# Patient Record
Sex: Female | Born: 1995 | Race: White | Hispanic: No | Marital: Single | Smoking: Former smoker
Health system: Southern US, Community
[De-identification: ages and names within clinical notes are randomized; demographics above are authoritative.]

## PROBLEM LIST (undated history)

## (undated) DIAGNOSIS — G43909 Migraine, unspecified, not intractable, without status migrainosus: Secondary | ICD-10-CM

## (undated) DIAGNOSIS — F329 Major depressive disorder, single episode, unspecified: Secondary | ICD-10-CM

## (undated) DIAGNOSIS — G35D Multiple sclerosis, unspecified: Secondary | ICD-10-CM

## (undated) DIAGNOSIS — G709 Myoneural disorder, unspecified: Secondary | ICD-10-CM

## (undated) DIAGNOSIS — G35 Multiple sclerosis: Secondary | ICD-10-CM

## (undated) DIAGNOSIS — F32A Depression, unspecified: Secondary | ICD-10-CM

## (undated) DIAGNOSIS — F419 Anxiety disorder, unspecified: Secondary | ICD-10-CM

## (undated) HISTORY — PX: TYMPANOSTOMY TUBE PLACEMENT: SHX32

## (undated) HISTORY — DX: Depression, unspecified: F32.A

## (undated) HISTORY — DX: Anxiety disorder, unspecified: F41.9

## (undated) HISTORY — DX: Myoneural disorder, unspecified: G70.9

## (undated) HISTORY — DX: Major depressive disorder, single episode, unspecified: F32.9

---
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
## 2017-02-28 ENCOUNTER — Encounter: Payer: Self-pay | Admitting: Emergency Medicine

## 2017-02-28 ENCOUNTER — Emergency Department
Admission: EM | Admit: 2017-02-28 | Discharge: 2017-02-28 | Disposition: A | Discharge status: Home / Self Care | Payer: Self-pay | Attending: Emergency Medicine | Admitting: Emergency Medicine

## 2017-02-28 DIAGNOSIS — Z5321 Procedure and treatment not carried out due to patient leaving prior to being seen by health care provider: Secondary | ICD-10-CM | POA: Insufficient documentation

## 2017-02-28 DIAGNOSIS — R1032 Left lower quadrant pain: Secondary | ICD-10-CM | POA: Insufficient documentation

## 2017-02-28 DIAGNOSIS — R51 Headache: Secondary | ICD-10-CM | POA: Insufficient documentation

## 2017-02-28 LAB — COMPREHENSIVE METABOLIC PANEL
ALT: 16 U/L (ref 14–54)
ANION GAP: 10 (ref 5–15)
AST: 27 U/L (ref 15–41)
Albumin: 4.3 g/dL (ref 3.5–5.0)
Alkaline Phosphatase: 53 U/L (ref 38–126)
BUN: 13 mg/dL (ref 6–20)
CHLORIDE: 104 mmol/L (ref 101–111)
CO2: 21 mmol/L — ABNORMAL LOW (ref 22–32)
CREATININE: 0.66 mg/dL (ref 0.44–1.00)
Calcium: 9.2 mg/dL (ref 8.9–10.3)
Glucose, Bld: 112 mg/dL — ABNORMAL HIGH (ref 65–99)
POTASSIUM: 3.7 mmol/L (ref 3.5–5.1)
SODIUM: 135 mmol/L (ref 135–145)
Total Bilirubin: 0.6 mg/dL (ref 0.3–1.2)
Total Protein: 7.4 g/dL (ref 6.5–8.1)

## 2017-02-28 LAB — URINALYSIS, COMPLETE (UACMP) WITH MICROSCOPIC
BACTERIA UA: NONE SEEN
BILIRUBIN URINE: NEGATIVE
GLUCOSE, UA: NEGATIVE mg/dL
Hgb urine dipstick: NEGATIVE
KETONES UR: NEGATIVE mg/dL
Nitrite: NEGATIVE
PH: 5 (ref 5.0–8.0)
PROTEIN: 100 mg/dL — AB
Specific Gravity, Urine: 1.03 (ref 1.005–1.030)

## 2017-02-28 LAB — POCT PREGNANCY, URINE: Preg Test, Ur: NEGATIVE

## 2017-02-28 LAB — CBC
HCT: 40.3 % (ref 35.0–47.0)
HEMOGLOBIN: 14.2 g/dL (ref 12.0–16.0)
MCH: 30.9 pg (ref 26.0–34.0)
MCHC: 35.3 g/dL (ref 32.0–36.0)
MCV: 87.4 fL (ref 80.0–100.0)
PLATELETS: 208 10*3/uL (ref 150–440)
RBC: 4.61 MIL/uL (ref 3.80–5.20)
RDW: 12.1 % (ref 11.5–14.5)
WBC: 4.9 10*3/uL (ref 3.6–11.0)

## 2017-02-28 LAB — LIPASE, BLOOD: LIPASE: 19 U/L (ref 11–51)

## 2017-02-28 NOTE — ED Triage Notes (Signed)
Pt arrived via POV from home with reports of left side abdominal pain for the past 2 days, pt states the pain comes and goes. Denies any vomiting or diarrhea, but states she did have  3 BMs today, but states she felt constipated. States she did not have loose or watery stools.  Pt has hx of MS as well. Mother is with patient. Pt states "if everything is ok I just want to go home"

## 2017-02-28 NOTE — ED Notes (Signed)
Pt has had more migraines than usual and has been taking Imitrex for relief.

## 2017-03-01 ENCOUNTER — Emergency Department: Payer: Self-pay

## 2017-03-01 ENCOUNTER — Encounter: Payer: Self-pay | Admitting: Emergency Medicine

## 2017-03-01 ENCOUNTER — Emergency Department
Admission: EM | Admit: 2017-03-01 | Discharge: 2017-03-01 | Disposition: A | Discharge status: Home / Self Care | Payer: Self-pay | Attending: Emergency Medicine | Admitting: Emergency Medicine

## 2017-03-01 DIAGNOSIS — Z87891 Personal history of nicotine dependence: Secondary | ICD-10-CM | POA: Insufficient documentation

## 2017-03-01 DIAGNOSIS — Z79899 Other long term (current) drug therapy: Secondary | ICD-10-CM | POA: Insufficient documentation

## 2017-03-01 DIAGNOSIS — Z880 Allergy status to penicillin: Secondary | ICD-10-CM | POA: Insufficient documentation

## 2017-03-01 DIAGNOSIS — R110 Nausea: Secondary | ICD-10-CM | POA: Insufficient documentation

## 2017-03-01 DIAGNOSIS — G35 Multiple sclerosis: Secondary | ICD-10-CM | POA: Insufficient documentation

## 2017-03-01 DIAGNOSIS — N83209 Unspecified ovarian cyst, unspecified side: Secondary | ICD-10-CM | POA: Insufficient documentation

## 2017-03-01 LAB — COMPREHENSIVE METABOLIC PANEL
ALBUMIN: 3.9 g/dL (ref 3.5–5.0)
ALK PHOS: 51 U/L (ref 38–126)
ALT: 17 U/L (ref 14–54)
ANION GAP: 9 (ref 5–15)
AST: 25 U/L (ref 15–41)
BILIRUBIN TOTAL: 1 mg/dL (ref 0.3–1.2)
BUN: 11 mg/dL (ref 6–20)
CO2: 21 mmol/L — AB (ref 22–32)
Calcium: 8.9 mg/dL (ref 8.9–10.3)
Chloride: 103 mmol/L (ref 101–111)
Creatinine, Ser: 0.62 mg/dL (ref 0.44–1.00)
GFR calc Af Amer: >60 mL/min (ref >60)
GFR calc non Af Amer: >60 mL/min (ref >60)
GLUCOSE: 105 mg/dL — AB (ref 65–99)
Potassium: 3.2 mmol/L — ABNORMAL LOW (ref 3.5–5.1)
Sodium: 133 mmol/L — ABNORMAL LOW (ref 135–145)
Total Protein: 6.9 g/dL (ref 6.5–8.1)

## 2017-03-01 LAB — CBC
HEMATOCRIT: 37.4 % (ref 35.0–47.0)
HEMOGLOBIN: 13 g/dL (ref 12.0–16.0)
MCH: 30.3 pg (ref 26.0–34.0)
MCHC: 34.8 g/dL (ref 32.0–36.0)
MCV: 86.9 fL (ref 80.0–100.0)
Platelets: 193 10*3/uL (ref 150–440)
RBC: 4.3 MIL/uL (ref 3.80–5.20)
RDW: 12.2 % (ref 11.5–14.5)
WBC: 3.7 10*3/uL (ref 3.6–11.0)

## 2017-03-01 LAB — LIPASE, BLOOD: LIPASE: 18 U/L (ref 11–51)

## 2017-03-01 MED ORDER — SODIUM CHLORIDE 0.9 % IV SOLN
1000.0000 mL | Freq: Once | INTRAVENOUS | Status: AC
  Administered 2017-03-01: 1000 mL via INTRAVENOUS

## 2017-03-01 MED ORDER — ONDANSETRON 4 MG PO TBDP: 4.0000 mg | ORAL_TABLET | Freq: Once | ORAL | Status: DC | PRN

## 2017-03-01 MED ORDER — TRAMADOL HCL 50 MG PO TABS: 50.0000 mg | ORAL_TABLET | Freq: Four times a day (QID) | ORAL | 0 refills | Status: DC | PRN

## 2017-03-01 MED ORDER — IOPAMIDOL (ISOVUE-300) INJECTION 61%
100.0000 mL | Freq: Once | INTRAVENOUS | Status: AC | PRN
  Administered 2017-03-01: 100 mL via INTRAVENOUS

## 2017-03-01 MED ORDER — MORPHINE SULFATE (PF) 4 MG/ML IV SOLN
4.0000 mg | Freq: Once | INTRAVENOUS | Status: AC
  Administered 2017-03-01: 4 mg via INTRAVENOUS
  Filled 2017-03-01: qty 1

## 2017-03-01 MED ORDER — ONDANSETRON 4 MG PO TBDP: 4.0000 mg | ORAL_TABLET | Freq: Three times a day (TID) | ORAL | 0 refills | Status: DC | PRN

## 2017-03-01 MED ORDER — ONDANSETRON HCL 4 MG/2ML IJ SOLN
4.0000 mg | Freq: Once | INTRAMUSCULAR | Status: AC
  Administered 2017-03-01: 4 mg via INTRAVENOUS
  Filled 2017-03-01: qty 2

## 2017-03-01 MED ORDER — IOPAMIDOL (ISOVUE-300) INJECTION 61%
15.0000 mL | INTRAVENOUS | Status: AC
  Administered 2017-03-01 (×2): 15 mL via ORAL

## 2017-03-01 NOTE — ED Triage Notes (Signed)
Patient presents to the ED with left lower quadrant pain x 2 days.  Patient came to the ED yesterday but left prior to being seen.  Patient reports she has had constant nausea and dry heaving since midnight.  Patient denies vomiting.  Reports frequent stools but states they are not loose.  Patient has history of MS.  Patient ambulatory to triage.

## 2017-03-01 NOTE — ED Provider Notes (Signed)
[facility] Emergency Department Provider Note   ____________________________________________    I have reviewed the triage vital signs and the nursing notes.   HISTORY  Chief Complaint Abdominal Pain     HPI Jennifer Watts is a 21 y.o. female Who presents with complaints of 2 days of left lower quadrant abdominal pain. Patient reports the pain has been worsening, she describes it as a constant cramping sensation worse with movement. She has never had this before. She came last night but left as she thought she was feeling better. Today she woke up feeling nauseated. She has not taken anything for this. No history of abdominal surgery. No fevers or chills, no diarrhea.asked father to leave the room and discussed with patient regarding vaginal discharge or concerns for STDs, she denies vaginal issues   Past Medical History:  Diagnosis Date  . Anxiety   . Depression   . MS (multiple sclerosis) (HCC)   . Neuromuscular disorder [facility])     Patient Active Problem List   Diagnosis Date Noted  . Panic disorder 07/08/2015  . Suicidal ideation 07/08/2015  . Sedative overdose 07/08/2015  . 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 Tobacco use disorder 07/08/2015  . Cannabis use disorder, moderate, dependence (HCC) 07/08/2015  . Dysmenorrhea in adolescent 06/02/2015  . Severe anxiety 10/28/2014  . Multiple sclerosis (HCC) 10/26/2014    Past Surgical History:  Procedure Laterality Date  . TYMPANOSTOMY TUBE PLACEMENT      Prior to Admission medications   Medication Sig Start Date End Date Taking? Authorizing Provider  Cholecalciferol (VITAMIN D PO) Take 1 tablet by mouth daily.   Yes [provider]  citalopram (CELEXA) 40 MG tablet Take 1 tablet (40 mg total) by mouth at bedtime. 07/10/15 03/01/17 Yes Pucilowska, Jolanta B, MD  Cyanocobalamin (B-12 PO) Take 1 tablet by mouth daily.   Yes [provider]  SUMAtriptan (IMITREX) 100 MG tablet Take 50-100 mg by mouth daily as needed. 01/08/17  Yes [provider]  traZODone (DESYREL) 100 MG tablet Take 1 tablet (100 mg total) by mouth at bedtime. Patient taking differently: Take 50 mg by mouth at bedtime.  07/10/15  Yes Pucilowska, Jolanta B, MD  ALPRAZolam (XANAX) 0.25 MG tablet Take 0.25 mg by mouth at bedtime as needed for anxiety.    [provider]  medroxyPROGESTERone (DEPO-PROVERA) 150 MG/ML injection INJECT 1 ML (150 MG TOTAL) INTO THE MUSCLE EVERY 3 (THREE) MONTHS. 11/28/16   Gabriel Cirri, NP  ondansetron (ZOFRAN ODT) 4 MG disintegrating tablet Take 1 tablet (4 mg total) by mouth every 8 (eight) hours as needed for nausea or vomiting. 03/01/17   Jene Every, MD  ondansetron (ZOFRAN) 8 MG tablet Take 1 tablet (8 mg total) by mouth every 8 (eight) hours as needed for nausea or vomiting. Patient not taking: Reported on 03/01/2017 09/02/16   Sudie Grumbling, NP  traMADol (ULTRAM) 50 MG tablet Take 1 tablet (50 mg total) by mouth every 6 (six) hours as needed. 03/01/17 03/01/18  Jene Every, MD     Allergies Ibuprofen; Penicillins; and Sulfa antibiotics  Family History  Problem Relation Age of Onset  . Hypertension Maternal Grandmother   . Cancer Paternal Grandfather        throat  . Multiple sclerosis Paternal Grandfather   . Anxiety disorder Mother     Social History Social History  Substance Use Topics  . Smoking status: Former Smoker  Packs/day: 0.50  . Smokeless tobacco: Never Used  . Alcohol use No    Review of Systems  Constitutional: No fever/chills Eyes: No visual changes.  ENT: No sore throat. Cardiovascular: Denies chest pain. Respiratory: Denies shortness of breath. Gastrointestinal: as above Genitourinary: Negative for dysuria. Musculoskeletal: Negative for back pain. Skin: Negative for rash. Neurological: Negative for headaches     ____________________________________________   PHYSICAL EXAM:  VITAL SIGNS: ED Triage Vitals  Enc Vitals Group     BP 03/01/17 0731 134/81     Pulse Rate 03/01/17 0731 87     Resp 03/01/17 0731 18     Temp 03/01/17 0731 98.2 F (36.8 C)     Temp Source 03/01/17 0731 Oral     SpO2 03/01/17 0731 96 %     Weight 03/01/17 0729 49.9 kg (110 lb)     Height 03/01/17 0729 1.651 m (5\' 5" )     Head Circumference --      Peak Flow --      Pain Score --      Pain Loc --      Pain Edu? --      Excl. in GC? --     Constitutional: Alert and oriented. No acute distress. Pleasant and interactive Eyes: Conjunctivae are normal.   Nose: No congestion/rhinnorhea. Mouth/Throat: Mucous membranes are moist.    Cardiovascular: Normal rate, regular rhythm. Grossly normal heart sounds.  Good peripheral circulation. Respiratory: Normal respiratory effort.  No retractions. Lungs CTAB. Gastrointestinal: no distention. No CVA tenderness, no left lower quadrant tenderness to palpation however right lower quadrant tenderness to palpation with McBurney's point tenderness Genitourinary: deferred Musculoskeletal:   Warm and well perfused Neurologic:  Normal speech and language. No gross focal neurologic deficits are appreciated.  Skin:  Skin is warm, dry and intact. No rash noted. Psychiatric: Mood and affect are normal. Speech and behavior are normal.  ____________________________________________   LABS (all labs ordered are listed, but only abnormal results are displayed)  Labs Reviewed  COMPREHENSIVE METABOLIC PANEL - Abnormal; Notable for the following:       Result Value   Sodium 133 (*)    Potassium 3.2 (*)    CO2 21 (*)    Glucose, Bld 105 (*)    All other components within normal limits  CBC  LIPASE, BLOOD   ____________________________________________  EKG  None ____________________________________________  RADIOLOGY  CT abdomen and pelvis performed with mild to moderate  fluid in the pelvis, normal appendix ____________________________________________   PROCEDURES  Procedure(s) performed: No    Critical Care performed:No ____________________________________________   INITIAL IMPRESSION / ASSESSMENT AND PLAN / ED COURSE  Pertinent labs & imaging results that were available during my care of the patient were reviewed by me and considered in my medical decision making (see chart for details).  patient with abdominal pain as described above. Differential diagnosis includes appendicitis, colitis, gastritis. pregnancy test is negative, done last night  Given the location of her pain I will order CT abdomen and pelvis to rule out appendicitis. We will give her IV morphine and IV Zofran and IV fluids  CT scan consistent with likely ruptured ovarian cyst. Patient has deferred pelvic exam, she feels much better after medication. Discussed with her incidental findings as well and the need for outpatient follow-up with her PCP    ____________________________________________   FINAL CLINICAL IMPRESSION(S) / ED DIAGNOSES  Final diagnoses:  Ruptured ovarian cyst      NEW MEDICATIONS STARTED DURING THIS  VISIT:  Discharge Medication List as of 03/01/2017 11:18 AM    START taking these medications   Details  traMADol (ULTRAM) 50 MG tablet Take 1 tablet (50 mg total) by mouth every 6 (six) hours as needed., Starting Thu 03/01/2017, Until Fri 03/01/2018, Print         Note:  This document was prepared using Dragon voice recognition software and may include unintentional dictation errors.    Jene Every, MD 03/01/17 [phone]

## 2017-03-05 ENCOUNTER — Ambulatory Visit: Payer: Self-pay | Admitting: Family Medicine

## 2017-03-05 ENCOUNTER — Ambulatory Visit (INDEPENDENT_AMBULATORY_CARE_PROVIDER_SITE_OTHER)
Admit: 2017-03-05 | Discharge: 2017-03-05 | Disposition: A | Discharge status: Home / Self Care | Payer: Self-pay | Attending: Family Medicine | Admitting: Family Medicine

## 2017-03-05 ENCOUNTER — Ambulatory Visit
Admission: EM | Admit: 2017-03-05 | Discharge: 2017-03-05 | Disposition: A | Discharge status: Home / Self Care | Payer: Self-pay | Attending: Family Medicine | Admitting: Family Medicine

## 2017-03-05 ENCOUNTER — Encounter: Payer: Self-pay | Admitting: Emergency Medicine

## 2017-03-05 DIAGNOSIS — N760 Acute vaginitis: Secondary | ICD-10-CM

## 2017-03-05 DIAGNOSIS — R1032 Left lower quadrant pain: Secondary | ICD-10-CM

## 2017-03-05 DIAGNOSIS — L509 Urticaria, unspecified: Secondary | ICD-10-CM

## 2017-03-05 DIAGNOSIS — R110 Nausea: Secondary | ICD-10-CM

## 2017-03-05 DIAGNOSIS — Z3202 Encounter for pregnancy test, result negative: Secondary | ICD-10-CM

## 2017-03-05 DIAGNOSIS — E876 Hypokalemia: Secondary | ICD-10-CM

## 2017-03-05 DIAGNOSIS — B9689 Other specified bacterial agents as the cause of diseases classified elsewhere: Secondary | ICD-10-CM

## 2017-03-05 DIAGNOSIS — R509 Fever, unspecified: Secondary | ICD-10-CM

## 2017-03-05 DIAGNOSIS — N730 Acute parametritis and pelvic cellulitis: Secondary | ICD-10-CM

## 2017-03-05 LAB — COMPREHENSIVE METABOLIC PANEL
ALBUMIN: 4.2 g/dL (ref 3.5–5.0)
ALK PHOS: 67 U/L (ref 38–126)
ALT: 23 U/L (ref 14–54)
ANION GAP: 10 (ref 5–15)
AST: 27 U/L (ref 15–41)
BUN: 11 mg/dL (ref 6–20)
CALCIUM: 9.2 mg/dL (ref 8.9–10.3)
CHLORIDE: 102 mmol/L (ref 101–111)
CO2: 25 mmol/L (ref 22–32)
Creatinine, Ser: 0.73 mg/dL (ref 0.44–1.00)
GFR calc non Af Amer: >60 mL/min (ref >60)
GLUCOSE: 114 mg/dL — AB (ref 65–99)
Potassium: 3 mmol/L — ABNORMAL LOW (ref 3.5–5.1)
Sodium: 137 mmol/L (ref 135–145)
Total Bilirubin: 1.5 mg/dL — ABNORMAL HIGH (ref 0.3–1.2)
Total Protein: 7.1 g/dL (ref 6.5–8.1)

## 2017-03-05 LAB — PREGNANCY, URINE: PREG TEST UR: NEGATIVE

## 2017-03-05 LAB — CBC WITH DIFFERENTIAL/PLATELET
Basophils Absolute: 0 10*3/uL (ref 0–0.1)
Basophils Relative: 0 %
Eosinophils Absolute: 0.1 10*3/uL (ref 0–0.7)
Eosinophils Relative: 1 %
HCT: 40.8 % (ref 35.0–47.0)
HEMOGLOBIN: 14.2 g/dL (ref 12.0–16.0)
LYMPHS ABS: 1.5 10*3/uL (ref 1.0–3.6)
LYMPHS PCT: 23 %
MCH: 30 pg (ref 26.0–34.0)
MCHC: 34.9 g/dL (ref 32.0–36.0)
MCV: 85.8 fL (ref 80.0–100.0)
MONOS PCT: 9 %
Monocytes Absolute: 0.6 10*3/uL (ref 0.2–0.9)
NEUTROS ABS: 4.5 10*3/uL (ref 1.4–6.5)
NEUTROS PCT: 67 %
Platelets: 234 10*3/uL (ref 150–440)
RBC: 4.76 MIL/uL (ref 3.80–5.20)
RDW: 12.3 % (ref 11.5–14.5)
WBC: 6.7 10*3/uL (ref 3.6–11.0)

## 2017-03-05 LAB — URINALYSIS, COMPLETE (UACMP) WITH MICROSCOPIC
Glucose, UA: 100 mg/dL — AB
HGB URINE DIPSTICK: NEGATIVE
KETONES UR: NEGATIVE mg/dL
LEUKOCYTES UA: NEGATIVE
NITRITE: NEGATIVE
PROTEIN: 100 mg/dL — AB
Specific Gravity, Urine: >1.03 — ABNORMAL HIGH (ref 1.005–1.030)
pH: 6 (ref 5.0–8.0)

## 2017-03-05 LAB — LIPASE, BLOOD: LIPASE: 19 U/L (ref 11–51)

## 2017-03-05 LAB — WET PREP, GENITAL
Sperm: NONE SEEN
Trich, Wet Prep: NONE SEEN
Yeast Wet Prep HPF POC: NONE SEEN

## 2017-03-05 LAB — CHLAMYDIA/NGC RT PCR (ARMC ONLY)
CHLAMYDIA TR: NOT DETECTED
N GONORRHOEAE: NOT DETECTED

## 2017-03-05 LAB — RAPID STREP SCREEN (MED CTR MEBANE ONLY): STREPTOCOCCUS, GROUP A SCREEN (DIRECT): NEGATIVE

## 2017-03-05 MED ORDER — POTASSIUM CHLORIDE CRYS ER 20 MEQ PO TBCR
40.0000 meq | EXTENDED_RELEASE_TABLET | Freq: Once | ORAL | Status: AC
  Administered 2017-03-05: 40 meq via ORAL

## 2017-03-05 MED ORDER — ONDANSETRON 8 MG PO TBDP
8.0000 mg | ORAL_TABLET | Freq: Once | ORAL | Status: AC
  Administered 2017-03-05: 8 mg via ORAL

## 2017-03-05 MED ORDER — ONDANSETRON HCL 4 MG PO TABS: 4.0000 mg | ORAL_TABLET | Freq: Four times a day (QID) | ORAL | 0 refills | Status: DC

## 2017-03-05 MED ORDER — METRONIDAZOLE 500 MG PO TABS: 500.0000 mg | ORAL_TABLET | Freq: Two times a day (BID) | ORAL | 0 refills | Status: AC

## 2017-03-05 MED ORDER — DOXYCYCLINE HYCLATE 100 MG PO CAPS: 100.0000 mg | ORAL_CAPSULE | Freq: Two times a day (BID) | ORAL | 0 refills | Status: AC

## 2017-03-05 MED ORDER — PREDNISONE 10 MG PO TABS: ORAL_TABLET | ORAL | 0 refills | Status: DC

## 2017-03-05 NOTE — Discharge Instructions (Signed)
Take medication as prescribed. Rest. Drink plenty of fluids.   Follow up with your primary care physician this week as discussed. Follow up with Gastroenterology.   Return to Urgent care for new or worsening concerns.

## 2017-03-05 NOTE — ED Provider Notes (Signed)
[facility] ____________________________________________  Time seen: Approximately 12:27 PM  I have reviewed the triage vital signs and the nursing notes.   HISTORY  Chief Complaint Urticaria; Nausea; and Fever   HPI Jennifer Watts is a 21 y.o. female  presenting with mother at bedside for evaluation of multiple medical complaints including nausea, abdominal pain, vaginal discharge and itchy rash. Reports initially had symptoms of left lower abdominal pain with nausea that is been present for approximately 4-5 days total, was seen in emergency room 4 days ago and had a CT abdomen, revealing suspected ruptured left ovarian cyst. States had received nausea and pain medication while in   emergency room and had felt better and was discharged. Reports is sent home with ODT Zofran, but insurance did not cover it and began taking meclizine over-the-counter to help with nausea which did not help. Reports 2 days after having CT she began having a very red itchy rash all over. States rash started on both legs and quickly worked upwards. States rash does somewhat improve with Benadryl, then returns. Denies any other changes in foods, medicines, lotions, detergents or other contacts. Denies insect bites. States only changes were taking meclizine, unsure if has taken before, and CT with contrast. States rash currently has improved than a few days ago, states did last take Benadryl at about 9 AM this morning.  Also reports the last 2-3 days has been having intermittent fevers at home, with highest being 102 yesterday. No Tylenol or antipyretics taken today prior to arrival, afebrile. States had one day of sore throat, denies any other complaints or throat. Denies any runny nose, nasal congestion or cough. States today noticed having some vaginal irritation and yellowish vaginal discharge. States sexually active with one partner, no recent changes, declines concerns of STDs. States some burning  with urination, new urinary frequency or urgency. Reports has continued with left lower quadrant abdominal pain currently mild, states it comes in waves. Denies any other abdominal pain, back pain, flank pain. States has had persistent nausea, no vomiting. Reports prior to ED visit she was having diarrhea, states yesterday he did have one loose stool but no other diarrheal episodes. States did take some over-the-counter Pepto and had some darker stool, but denies any blood in stool or otherwise abnormal colored stool. Denies any concern of blood in stool. States has continued drink fluids well, not eating quite as much is normal.  Denies chest pain, shortness of breath. States when rash worse she has some generalized extremity achiness and tightness.  Denies recent sickness. Denies recent antibiotic use.  Mother patient reports that she has a history of MS, and takes rituximab.  Gabriel Cirri, NP: PCP No LMP recorded. Patient has had an injection. Depo. Denies pregnancy.    Past Medical History:  Diagnosis Date  . Anxiety   . Depression   . MS (multiple sclerosis) (HCC)   . Neuromuscular disorder [facility])     Patient Active Problem List   Diagnosis Date Noted  . Panic disorder 07/08/2015  . Suicidal ideation 07/08/2015  . Sedative overdose 07/08/2015  . 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 Tobacco use disorder 07/08/2015  . Cannabis use disorder, moderate, dependence (HCC) 07/08/2015  . Dysmenorrhea in adolescent 06/02/2015  . Severe anxiety 10/28/2014  . Multiple sclerosis (HCC) 10/26/2014    Past Surgical History:  Procedure Laterality Date  . TYMPANOSTOMY TUBE PLACEMENT       No current facility-administered medications for this  encounter.   Current Outpatient Prescriptions:  .  ALPRAZolam (XANAX) 0.25 MG tablet, Take 0.25 mg by mouth at bedtime as needed for anxiety., Disp: , Rfl:  .  medroxyPROGESTERone (DEPO-PROVERA) 150 MG/ML  injection, INJECT 1 ML (150 MG TOTAL) INTO THE MUSCLE EVERY 3 (THREE) MONTHS., Disp: 1 mL, Rfl: 4 .  SUMAtriptan (IMITREX) 100 MG tablet, Take 50-100 mg by mouth daily as needed., Disp: , Rfl:  .  traMADol (ULTRAM) 50 MG tablet, Take 1 tablet (50 mg total) by mouth every 6 (six) hours as needed., Disp: 10 tablet, Rfl: 0 .  traZODone (DESYREL) 100 MG tablet, Take 1 tablet (100 mg total) by mouth at bedtime. (Patient taking differently: Take 50 mg by mouth at bedtime. ), Disp: 30 tablet, Rfl: 0 .  Cholecalciferol (VITAMIN D PO), Take 1 tablet by mouth daily., Disp: , Rfl:  .  citalopram (CELEXA) 40 MG tablet, Take 1 tablet (40 mg total) by mouth at bedtime., Disp: 30 tablet, Rfl: 0 .  Cyanocobalamin (B-12 PO), Take 1 tablet by mouth daily., Disp: , Rfl:  .  doxycycline (VIBRAMYCIN) 100 MG capsule, Take 1 capsule (100 mg total) by mouth 2 (two) times daily., Disp: 28 capsule, Rfl: 0 .  metroNIDAZOLE (FLAGYL) 500 MG tablet, Take 1 tablet (500 mg total) by mouth 2 (two) times daily., Disp: 28 tablet, Rfl: 0 .  ondansetron (ZOFRAN ODT) 4 MG disintegrating tablet, Take 1 tablet (4 mg total) by mouth every 8 (eight) hours as needed for nausea or vomiting., Disp: 20 tablet, Rfl: 0 .  ondansetron (ZOFRAN) 4 MG tablet, Take 1 tablet (4 mg total) by mouth every 6 (six) hours., Disp: 12 tablet, Rfl: 0 .  ondansetron (ZOFRAN) 8 MG tablet, Take 1 tablet (8 mg total) by mouth every 8 (eight) hours as needed for nausea or vomiting. (Patient not taking: Reported on 03/01/2017), Disp: 12 tablet, Rfl: 0 .  predniSONE (DELTASONE) 10 MG tablet, Start 60 mg po day one, then 50 mg po day two, taper by 10 mg daily until complete., Disp: 21 tablet, Rfl: 0 Retuximab  Allergies Ibuprofen; Penicillins; and Sulfa antibiotics  Family History  Problem Relation Age of Onset  . Hypertension Maternal Grandmother   . Cancer Paternal Grandfather        throat  . Multiple sclerosis Paternal Grandfather   . Anxiety disorder Mother    No history of PE/DVT  Social History Social History  Substance Use Topics  . Smoking status: Current Every Day Smoker    Packs/day: 0.50  . Smokeless tobacco: Never Used  . Alcohol use No    Review of Systems Constitutional: As above. ENT: One day of sore throat.  Cardiovascular: Denies chest pain. Respiratory: Denies shortness of breath. Gastrointestinal: As above.  Genitourinary: Positive for dysuria. Musculoskeletal: Negative for back pain. Skin: Positive for rash. Neurological: Negative for headaches, focal weakness or numbness.   ____________________________________________   PHYSICAL EXAM:  VITAL SIGNS: ED Triage Vitals  Enc Vitals Group     BP 03/05/17 1104 (!) 119/92     Pulse Rate 03/05/17 1104 97     Resp 03/05/17 1104 16     Temp 03/05/17 1104 98.6 F (37 C)     Temp Source 03/05/17 1104 Oral     SpO2 03/05/17 1104 100 %     Weight 03/05/17 1103 110 lb (49.9 kg)     Height 03/05/17 1103 5\' 5"  (1.651 m)     Head Circumference --  Peak Flow --      Pain Score 03/05/17 1104 2     Pain Loc --      Pain Edu? --      Excl. in GC? --     Constitutional: Alert and oriented. Well appearing and in no acute distress. Eyes: Conjunctivae are normal.  ENT      Head: Normocephalic and atraumatic.      Ears: nontender, no erythema, normal TMs bilaterally.       Nose: No congestion/rhinnorhea.      Mouth/Throat: Mucous membranes are moist.Oropharynx non-erythematous. No tonsillar swelling or exudate.  Neck: No stridor. Supple without meningismus.  Hematological/Lymphatic/Immunilogical: No cervical lymphadenopathy. Cardiovascular: Normal rate, regular rhythm. Grossly normal heart sounds.  Good peripheral circulation. Respiratory: Normal respiratory effort without tachypnea nor retractions. Breath sounds are clear and equal bilaterally. No wheezes, rales, rhonchi. Gastrointestinal: Mild left lower quadrant and periumbilical tenderness palpation. Abdomen  otherwise soft and nontender. Normal Bowel sounds. No CVA tenderness. Pelvic: Completed with Gunnar Fusi CMA at bedside as chaperone. External: Normal appearance, new rash or lesions. Speculum: Mild to moderate amount of whitish yellowish vaginal discharge, no bleeding, cervical os closed. Bimanual: No right adnexal tenderness, minimal cervical motion tenderness, mild left adnexal tenderness. Musculoskeletal:  Nontender with normal range of motion in all extremities. No midline cervical, thoracic or lumbar tenderness to palpation. Bilateral pedal pulses equal and easily palpated.      Right lower leg:  No tenderness or edema.      Left lower leg:  No tenderness or edema.  Neurologic:  Normal speech and language. No gross focal neurologic deficits are appreciated. Speech is normal. No gait instability.  Skin:  Skin is warm, dry. Diffuse blanchable minimally erythematous urticarial rash present to bilateral upper and lower extremities as well as torso, nontender, no edema, no drainage. Psychiatric: Mood and affect are normal. Speech and behavior are normal. Patient exhibits appropriate insight and judgment   ___________________________________________   LABS (all labs ordered are listed, but only abnormal results are displayed)  Labs Reviewed  WET PREP, GENITAL - Abnormal; Notable for the following:       Result Value   Clue Cells Wet Prep HPF POC PRESENT (*)    WBC, Wet Prep HPF POC TOO NUMEROUS TO COUNT (*)    All other components within normal limits  COMPREHENSIVE METABOLIC PANEL - Abnormal; Notable for the following:    Potassium 3.0 (*)    Glucose, Bld 114 (*)    Total Bilirubin 1.5 (*)    All other components within normal limits  URINALYSIS, COMPLETE (UACMP) WITH MICROSCOPIC - Abnormal; Notable for the following:    Color, Urine AMBER (*)    Specific Gravity, Urine >1.030 (*)    Glucose, UA 100 (*)    Bilirubin Urine MODERATE (*)    Protein, ur 100 (*)    Squamous Epithelial / LPF 0-5  (*)    Bacteria, UA RARE (*)    All other components within normal limits  RAPID STREP SCREEN (NOT AT [facility])  CHLAMYDIA/NGC RT PCR ([facility] ONLY)  CULTURE, GROUP A STREP ([facility])  URINE CULTURE  CBC WITH DIFFERENTIAL/PLATELET  LIPASE, BLOOD  PREGNANCY, URINE   RADIOLOGY  US Pelvic Doppler (torsion R/o Or Mass Arterial Flow)  Result Date: 03/05/2017 CLINICAL DATA:  Initial evaluation for acute left lower quadrant abdominal pain for 1 week. Concern for possible ruptured cyst on prior CT. EXAM: TRANSABDOMINAL AND TRANSVAGINAL ULTRASOUND OF PELVIS DOPPLER ULTRASOUND OF OVARIES TECHNIQUE: Both  transabdominal and transvaginal ultrasound examinations of the pelvis were performed. Transabdominal technique was performed for global imaging of the pelvis including uterus, ovaries, adnexal regions, and pelvic cul-de-sac. It was necessary to proceed with endovaginal exam following the transabdominal exam to visualize the uterus and ovaries. Color and duplex Doppler ultrasound was utilized to evaluate blood flow to the ovaries. COMPARISON:  [facility] from 03/01/2017. FINDINGS: Uterus Measurements: 5.2 x 2.2 x 3.1 cm. No fibroids or other mass visualized. Endometrium Thickness: 2 mm.  No focal abnormality visualized. Right ovary Measurements: 2.8 x 1.6 x 2.5 cm. Normal appearance/no adnexal mass. No appreciable physiologic or other cystic lesion seen within the right ovary. Left ovary Measurements: 3.0 x 2.2 x 1.5 cm. Normal appearance/no adnexal mass. No appreciable physiologic or other cystic lesion seen within the right ovary. Pulsed Doppler evaluation of both ovaries demonstrates normal low-resistance arterial and venous waveforms. Other findings Small moderate free fluid within the pelvis, likely physiologic. IMPRESSION: 1. Small to moderate free physiologic fluid within the pelvis, likely physiologic, and similar to prior CT. 2. Normal sonographic appearance of the ovaries. No discrete physiologic or  other cyst seen on today's exam. No evidence for ovarian torsion. 3. Normal sonographic appearance of the uterus and endometrium. Electronically Signed   By: Rise Mu M.D.   On: 03/05/2017 14:00   US Pelvic Complete With Transvaginal  Result Date: 03/05/2017 CLINICAL DATA:  Initial evaluation for acute left lower quadrant abdominal pain for 1 week. Concern for possible ruptured cyst on prior CT. EXAM: TRANSABDOMINAL AND TRANSVAGINAL ULTRASOUND OF PELVIS DOPPLER ULTRASOUND OF OVARIES TECHNIQUE: Both transabdominal and transvaginal ultrasound examinations of the pelvis were performed. Transabdominal technique was performed for global imaging of the pelvis including uterus, ovaries, adnexal regions, and pelvic cul-de-sac. It was necessary to proceed with endovaginal exam following the transabdominal exam to visualize the uterus and ovaries. Color and duplex Doppler ultrasound was utilized to evaluate blood flow to the ovaries. COMPARISON:  [facility] from 03/01/2017. FINDINGS: Uterus Measurements: 5.2 x 2.2 x 3.1 cm. No fibroids or other mass visualized. Endometrium Thickness: 2 mm.  No focal abnormality visualized. Right ovary Measurements: 2.8 x 1.6 x 2.5 cm. Normal appearance/no adnexal mass. No appreciable physiologic or other cystic lesion seen within the right ovary. Left ovary Measurements: 3.0 x 2.2 x 1.5 cm. Normal appearance/no adnexal mass. No appreciable physiologic or other cystic lesion seen within the right ovary. Pulsed Doppler evaluation of both ovaries demonstrates normal low-resistance arterial and venous waveforms. Other findings Small moderate free fluid within the pelvis, likely physiologic. IMPRESSION: 1. Small to moderate free physiologic fluid within the pelvis, likely physiologic, and similar to prior CT. 2. Normal sonographic appearance of the ovaries. No discrete physiologic or other cyst seen on today's exam. No evidence for ovarian torsion. 3. Normal sonographic appearance  of the uterus and endometrium. Electronically Signed   By: Rise Mu M.D.   On: 03/05/2017 14:00   ____________________________________________   PROCEDURES Procedures    INITIAL IMPRESSION / ASSESSMENT AND PLAN / ED COURSE  Pertinent labs & imaging results that were available during my care of the patient were reviewed by me and considered in my medical decision making (see chart for details).  Well-appearing patient. No acute distress. Afebrile at this time, no antipyretic taken prior to arrival. Mild diffuse left lower abdominal. Umbilical tenderness, CT from recent ER visit reviewed. Discussed in detail with patient and mother concerning for multiple etiologies for multiple medical complaints. Will evaluate, CBC, CMP,  lipase, urine, urine prior wet prep and gonorrhea and chlamydia. Also strep throat, declines monotest. Recommend pelvic ultrasound, patient agrees as well as to pelvic exam.  Labs reviewed and discussed in detail with patient and mother. Potassium 3, 40 mEq potassium orally given in urgent care. 8 mg ODT Zofran also given in urgent care, patient reports nausea since resolved and now hungry. Will Rx Zofran tablets, not ODT having that patient will be a relative for this medication. Concern for left ovarian issue as well as concern for PID, will culture urine. Pelvic ultrasound obtained. Patient allergic to penicillins, and states that she is very sensitive to medications and unsure if she's ever taken cephalosporins, declines Rocephin. Will start patient on doxycycline as well as Flagyl, and Zofran. Rash has urticarial appearance, concern for delayed CT contrast reaction versus meclizine reaction. Recommend to stop meclizine. Continue home intermittent Benadryl as well as will start prednisone. Discussed indication, risks and benefits of medications with patient.  Discussed follow up with Primary care physician this week. Follow up with GI. Discussed follow up and return  parameters including no resolution or any worsening concerns. Patient verbalized understanding and agreed to plan.   ____________________________________________   FINAL CLINICAL IMPRESSION(S) / ED DIAGNOSES  Final diagnoses:  Nausea without vomiting  Abdominal pain, left lower quadrant  Bacterial vaginosis  Hypokalemia  PID (acute pelvic inflammatory disease)  Hyperbilirubinemia  Hives     Discharge Medication List as of 03/05/2017  2:17 PM    START taking these medications   Details  doxycycline (VIBRAMYCIN) 100 MG capsule Take 1 capsule (100 mg total) by mouth 2 (two) times daily., Starting Mon 03/05/2017, Until Mon 03/19/2017, Normal    metroNIDAZOLE (FLAGYL) 500 MG tablet Take 1 tablet (500 mg total) by mouth 2 (two) times daily., Starting Mon 03/05/2017, Until Mon 03/19/2017, Normal    !! ondansetron (ZOFRAN) 4 MG tablet Take 1 tablet (4 mg total) by mouth every 6 (six) hours., Starting Mon 03/05/2017, Normal    predniSONE (DELTASONE) 10 MG tablet Start 60 mg po day one, then 50 mg po day two, taper by 10 mg daily until complete., Normal     !! - Potential duplicate medications found. Please discuss with provider.      Note: This dictation was prepared with Dragon dictation along with smaller phrase technology. Any transcriptional errors that result from this process are unintentional.         Renford DillsMiller, Jerlene Rockers, NP 03/05/17 1456

## 2017-03-05 NOTE — ED Triage Notes (Signed)
Patient in today with her mother c/o hives, nausea, fever x 2 days. Patient went to the ED on 03/01/17 due to abdominal pain that ended up veing an ovarian cyst that burst. Patient has MS.

## 2017-03-06 ENCOUNTER — Ambulatory Visit
Admission: EM | Admit: 2017-03-06 | Discharge: 2017-03-06 | Disposition: A | Discharge status: Home / Self Care | Payer: Self-pay | Attending: Family Medicine | Admitting: Family Medicine

## 2017-03-06 ENCOUNTER — Encounter: Payer: Self-pay | Admitting: Emergency Medicine

## 2017-03-06 ENCOUNTER — Ambulatory Visit: Payer: Self-pay | Admitting: Gastroenterology

## 2017-03-06 DIAGNOSIS — J029 Acute pharyngitis, unspecified: Secondary | ICD-10-CM

## 2017-03-06 NOTE — Discharge Instructions (Signed)
Everything looks well.  Take care  Dr. Adriana Simas

## 2017-03-06 NOTE — ED Triage Notes (Signed)
Patient was seen yesterday.  Patient c/o sore throat and tightness in her throat.  Patient on an antibiotic.

## 2017-03-06 NOTE — ED Provider Notes (Signed)
[facility]    CSN: [id number] Arrival date & time: 03/06/17  1111  History   Chief Complaint Chief Complaint  Patient presents with  . Sore Throat   HPI  21 year old female presents with sore throat.  Patient was recently seen yesterday.  Had multiple complaints and had a thorough workup.  Rapid strep negative.  Patient states that her sore throat was more prominent this morning.  Scratchy and itchy.  She has had ongoing medical problems and was concerned about her symptoms.  Mild to moderate.  No S of breath.  No medications tried other than what she is currently prescribed.  No other associated symptoms.  No other complaints or concerns at this time.  Past Medical History:  Diagnosis Date  . Anxiety   . Depression   . MS (multiple sclerosis) (HCC)   . Neuromuscular disorder [facility])    Patient Active Problem List   Diagnosis Date Noted  . Panic disorder 07/08/2015  . Suicidal ideation 07/08/2015  . Sedative overdose 07/08/2015  . 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 Tobacco use disorder 07/08/2015  . Cannabis use disorder, moderate, dependence (HCC) 07/08/2015  . Dysmenorrhea in adolescent 06/02/2015  . Severe anxiety 10/28/2014  . Multiple sclerosis (HCC) 10/26/2014   Past Surgical History:  Procedure Laterality Date  . TYMPANOSTOMY TUBE PLACEMENT     OB History    No data available     Home Medications    Prior to Admission medications   Medication Sig Start Date End Date Taking? Authorizing Provider  ALPRAZolam Prudy Feeler) 0.25 MG tablet Take 0.25 mg by mouth at bedtime as needed for anxiety.    [provider]  Cholecalciferol (VITAMIN D PO) Take 1 tablet by mouth daily.    [provider]  citalopram (CELEXA) 40 MG tablet Take 1 tablet (40 mg total) by mouth at bedtime. 07/10/15 03/01/17  Pucilowska, Braulio Conte B, MD  Cyanocobalamin (B-12 PO) Take 1 tablet by mouth daily.    [provider]  doxycycline (VIBRAMYCIN) 100 MG capsule Take 1 capsule (100 mg total) by mouth 2 (two) times daily. 03/05/17 03/19/17  Renford Dills, NP  medroxyPROGESTERone (DEPO-PROVERA) 150 MG/ML injection INJECT 1 ML (150 MG TOTAL) INTO THE MUSCLE EVERY 3 (THREE) MONTHS. 11/28/16   Gabriel Cirri, NP  metroNIDAZOLE (FLAGYL) 500 MG tablet Take 1 tablet (500 mg total) by mouth 2 (two) times daily. 03/05/17 03/19/17  Renford Dills, NP  ondansetron (ZOFRAN ODT) 4 MG disintegrating tablet Take 1 tablet (4 mg total) by mouth every 8 (eight) hours as needed for nausea or vomiting. 03/01/17   Jene Every, MD  ondansetron (ZOFRAN) 4 MG tablet Take 1 tablet (4 mg total) by mouth every 6 (six) hours. 03/05/17   Renford Dills, NP  ondansetron (ZOFRAN) 8 MG tablet Take 1 tablet (8 mg total) by mouth every 8 (eight) hours as needed for nausea or vomiting. Patient not taking: Reported on 03/01/2017 09/02/16   Sudie Grumbling, NP  predniSONE (DELTASONE) 10 MG tablet Start 60 mg po day one, then 50 mg po day two, taper by 10 mg daily until complete. 03/05/17   Renford Dills, NP  SUMAtriptan (IMITREX) 100 MG tablet Take 50-100 mg by mouth daily as needed. 01/08/17   [provider]  traMADol (ULTRAM) 50 MG tablet Take 1 tablet (50 mg total) by mouth every 6 (six) hours as needed. 03/01/17 03/01/18  Jene Every, MD  traZODone (DESYREL) 100 MG tablet Take  1 tablet (100 mg total) by mouth at bedtime. Patient taking differently: Take 50 mg by mouth at bedtime.  07/10/15   Shari Prows, MD   Family History Family History  Problem Relation Age of Onset  . Hypertension Maternal Grandmother   . Cancer Paternal Grandfather        throat  . Multiple sclerosis Paternal Grandfather   . Anxiety disorder Mother     Social History Social History  Substance Use Topics  . Smoking status: Current Every Day Smoker    Packs/day: 0.50  . Smokeless tobacco: Never Used  . Alcohol use No     Allergies   Ibuprofen; Penicillins; and Sulfa antibiotics   Review of Systems Review of Systems  HENT: Positive for sore throat.   Respiratory: Negative.   Skin: Positive for rash.   Physical Exam Triage Vital Signs ED Triage Vitals [03/06/17 1121]  Enc Vitals Group     BP (!) 123/91     Pulse Rate 82     Resp 14     Temp 98.2 F (36.8 C)     Temp Source Oral     SpO2 99 %     Weight 111 lb 8 oz (50.6 kg)     Height 5\' 5"  (1.651 m)     Head Circumference      Peak Flow      Pain Score 4     Pain Loc      Pain Edu?      Excl. in GC?    Updated Vital Signs BP (!) 123/91 (BP Location: Left Arm)   Pulse 82   Temp 98.2 F (36.8 C) (Oral)   Resp 14   Ht 5\' 5"  (1.651 m)   Wt 111 lb 8 oz (50.6 kg)   SpO2 99%   BMI 18.55 kg/m   Physical Exam  Constitutional: She is oriented to person, place, and time. She appears well-developed. No distress.  HENT:  Head: Normocephalic and atraumatic.  Mouth/Throat: Oropharynx is clear and moist.  Cardiovascular: Normal rate and regular rhythm.   No murmur heard. Pulmonary/Chest: Effort normal. No respiratory distress. She has no wheezes. She has no rales.  Neurological: She is alert and oriented to person, place, and time.  Psychiatric: She has a normal mood and affect.  Vitals reviewed.  UC Treatments / Results  Labs (all labs ordered are listed, but only abnormal results are displayed) Labs Reviewed - No data to display  EKG  EKG Interpretation None       Radiology US Pelvic Doppler (torsion R/o Or Mass Arterial Flow)  Result Date: 03/05/2017 CLINICAL DATA:  Initial evaluation for acute left lower quadrant abdominal pain for 1 week. Concern for possible ruptured cyst on prior CT. EXAM: TRANSABDOMINAL AND TRANSVAGINAL ULTRASOUND OF PELVIS DOPPLER ULTRASOUND OF OVARIES TECHNIQUE: Both transabdominal and transvaginal ultrasound examinations of the pelvis were performed. Transabdominal technique was performed for  global imaging of the pelvis including uterus, ovaries, adnexal regions, and pelvic cul-de-sac. It was necessary to proceed with endovaginal exam following the transabdominal exam to visualize the uterus and ovaries. Color and duplex Doppler ultrasound was utilized to evaluate blood flow to the ovaries. COMPARISON:  [facility] CT from 03/01/2017. FINDINGS: Uterus Measurements: 5.2 x 2.2 x 3.1 cm. No fibroids or other mass visualized. Endometrium Thickness: 2 mm.  No focal abnormality visualized. Right ovary Measurements: 2.8 x 1.6 x 2.5 cm. Normal appearance/no adnexal mass. No appreciable physiologic or other cystic lesion seen within  the right ovary. Left ovary Measurements: 3.0 x 2.2 x 1.5 cm. Normal appearance/no adnexal mass. No appreciable physiologic or other cystic lesion seen within the right ovary. Pulsed Doppler evaluation of both ovaries demonstrates normal low-resistance arterial and venous waveforms. Other findings Small moderate free fluid within the pelvis, likely physiologic. IMPRESSION: 1. Small to moderate free physiologic fluid within the pelvis, likely physiologic, and similar to prior CT. 2. Normal sonographic appearance of the ovaries. No discrete physiologic or other cyst seen on today's exam. No evidence for ovarian torsion. 3. Normal sonographic appearance of the uterus and endometrium. Electronically Signed   By: Rise MuBenjamin  McClintock M.D.   On: 03/05/2017 14:00   [place] Pelvic Complete With Transvaginal  Result Date: 03/05/2017 CLINICAL DATA:  Initial evaluation for acute left lower quadrant abdominal pain for 1 week. Concern for possible ruptured cyst on prior CT. EXAM: TRANSABDOMINAL AND TRANSVAGINAL ULTRASOUND OF PELVIS DOPPLER ULTRASOUND OF OVARIES TECHNIQUE: Both transabdominal and transvaginal ultrasound examinations of the pelvis were performed. Transabdominal technique was performed for global imaging of the pelvis including uterus, ovaries, adnexal regions, and pelvic cul-de-sac. It  was necessary to proceed with endovaginal exam following the transabdominal exam to visualize the uterus and ovaries. Color and duplex Doppler ultrasound was utilized to evaluate blood flow to the ovaries. COMPARISON:  [facility] from 03/01/2017. FINDINGS: Uterus Measurements: 5.2 x 2.2 x 3.1 cm. No fibroids or other mass visualized. Endometrium Thickness: 2 mm.  No focal abnormality visualized. Right ovary Measurements: 2.8 x 1.6 x 2.5 cm. Normal appearance/no adnexal mass. No appreciable physiologic or other cystic lesion seen within the right ovary. Left ovary Measurements: 3.0 x 2.2 x 1.5 cm. Normal appearance/no adnexal mass. No appreciable physiologic or other cystic lesion seen within the right ovary. Pulsed Doppler evaluation of both ovaries demonstrates normal low-resistance arterial and venous waveforms. Other findings Small moderate free fluid within the pelvis, likely physiologic. IMPRESSION: 1. Small to moderate free physiologic fluid within the pelvis, likely physiologic, and similar to prior CT. 2. Normal sonographic appearance of the ovaries. No discrete physiologic or other cyst seen on today's exam. No evidence for ovarian torsion. 3. Normal sonographic appearance of the uterus and endometrium. Electronically Signed   By: Rise MuBenjamin  McClintock M.D.   On: 03/05/2017 14:00    Procedures Procedures (including critical care time)  Medications Ordered in UC Medications - No data to display   Initial Impression / Assessment and Plan / UC Course  I have reviewed the triage vital signs and the nursing notes.  Pertinent labs & imaging results that were available during my care of the patient were reviewed by me and considered in my medical decision making (see chart for details).    21 year old female presents with sore throat.  Well-appearing.  Exam unremarkable.  Recent rapid strep negative.  No evidence of strep throat on exam.  Supportive care; OTC Ibuprofen/analgesics.  Final Clinical  Impressions(s) / UC Diagnoses   Final diagnoses:  Pharyngitis, unspecified etiology   New Prescriptions Discharge Medication List as of 03/06/2017 11:52 AM     Controlled Substance Prescriptions [place] Controlled Substance Registry consulted? Not Applicable   Tommie SamsCook, Ambry Dix G, DO 03/06/17 1206

## 2017-03-07 LAB — URINE CULTURE

## 2017-03-08 ENCOUNTER — Other Ambulatory Visit: Payer: Self-pay | Admitting: Gastroenterology

## 2017-03-08 DIAGNOSIS — R17 Unspecified jaundice: Secondary | ICD-10-CM

## 2017-03-08 LAB — CULTURE, GROUP A STREP (THRC)

## 2017-03-14 DIAGNOSIS — G43009 Migraine without aura, not intractable, without status migrainosus: Secondary | ICD-10-CM | POA: Insufficient documentation

## 2017-03-16 ENCOUNTER — Ambulatory Visit: Payer: Self-pay | Admitting: Gastroenterology

## 2017-03-20 ENCOUNTER — Ambulatory Visit
Admission: EM | Admit: 2017-03-20 | Discharge: 2017-03-20 | Disposition: A | Discharge status: Home / Self Care | Payer: Medicaid Other | Attending: Family Medicine | Admitting: Family Medicine

## 2017-03-20 ENCOUNTER — Other Ambulatory Visit: Payer: Self-pay

## 2017-03-20 DIAGNOSIS — R110 Nausea: Secondary | ICD-10-CM

## 2017-03-20 DIAGNOSIS — R1030 Lower abdominal pain, unspecified: Secondary | ICD-10-CM

## 2017-03-20 NOTE — ED Provider Notes (Signed)
[facility]    CSN: [id number] Arrival date & time: 03/20/17  1306  History   Chief Complaint Chief Complaint  Patient presents with  . Abdominal Pain   HPI  21 year old female presents with abdominal pain.  This is an ongoing issue.  Has been worse again for the past 4 days.  She reports lower abdominal pain.  Described as crampy.  Also described as burning.  She states that she feels the urge to to have a bowel movement frequently.  She has been going to the restroom regularly.  However she states that she is having to strain.  She has been taking Zofran frequently for nausea.  She is also seen GI recently.  She has had CT imaging and ultrasound recently. Has also had recent antibiotic.  No known exacerbating or relieving factors.  She is very concerned.  No other complaints at this time.  Past Medical History:  Diagnosis Date  . Anxiety   . Depression   . MS (multiple sclerosis) (HCC)   . Neuromuscular disorder [facility](HCC)     Patient Active Problem List   Diagnosis Date Noted  . Panic disorder 07/08/2015  . Suicidal ideation 07/08/2015  . 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 Tobacco use disorder 07/08/2015  . Cannabis use disorder, moderate, dependence (HCC) 07/08/2015  . Dysmenorrhea in adolescent 06/02/2015  . Severe anxiety 10/28/2014  . Multiple sclerosis (HCC) 10/26/2014    Past Surgical History:  Procedure Laterality Date  . TYMPANOSTOMY TUBE PLACEMENT      OB History    No data available       Home Medications    Prior to Admission medications   Medication Sig Start Date End Date Taking? Authorizing Provider  ALPRAZolam Prudy Feeler(XANAX) 0.25 MG tablet Take 0.25 mg by mouth at bedtime as needed for anxiety.    [provider]  Cholecalciferol (VITAMIN D PO) Take 1 tablet by mouth daily.    [provider]  citalopram (CELEXA) 40 MG tablet Take 1 tablet (40 mg total) by mouth at bedtime. 07/10/15  03/01/17  Pucilowska, Braulio ConteJolanta B, MD  Cyanocobalamin (B-12 PO) Take 1 tablet by mouth daily.    [provider]  medroxyPROGESTERone (DEPO-PROVERA) 150 MG/ML injection INJECT 1 ML (150 MG TOTAL) INTO THE MUSCLE EVERY 3 (THREE) MONTHS. 11/28/16   Gabriel CirriWicker, Cheryl, NP  SUMAtriptan (IMITREX) 100 MG tablet Take 50-100 mg by mouth daily as needed. 01/08/17   [provider]  traMADol (ULTRAM) 50 MG tablet Take 1 tablet (50 mg total) by mouth every 6 (six) hours as needed. 03/01/17 03/01/18  Jene EveryKinner, Robert, MD  traZODone (DESYREL) 100 MG tablet Take 1 tablet (100 mg total) by mouth at bedtime. Patient taking differently: Take 50 mg by mouth at bedtime.  07/10/15   Shari ProwsPucilowska, Jolanta B, MD    Family History Family History  Problem Relation Age of Onset  . Hypertension Maternal Grandmother   . Cancer Paternal Grandfather        throat  . Multiple sclerosis Paternal Grandfather   . Anxiety disorder Mother     Social History Social History   Tobacco Use  . Smoking status: Current Every Day Smoker    Packs/day: 0.50  . Smokeless tobacco: Never Used  Substance Use Topics  . Alcohol use: No  . Drug use: No     Allergies   Ibuprofen; Penicillins; and Sulfa antibiotics   Review of Systems Review of Systems  Constitutional: Negative.  Negative for chills.  Gastrointestinal: Positive for abdominal pain and nausea.       Urge to have a BM.   Physical Exam Triage Vital Signs ED Triage Vitals  Enc Vitals Group     BP 03/20/17 1323 (!) 130/93     Pulse Rate 03/20/17 1323 92     Resp 03/20/17 1323 16     Temp 03/20/17 1323 98.9 F (37.2 C)     Temp Source 03/20/17 1323 Oral     SpO2 03/20/17 1323 99 %     Weight 03/20/17 1324 111 lb (50.3 kg)     Height 03/20/17 1324 5\' 5"  (1.651 m)     Head Circumference --      Peak Flow --      Pain Score 03/20/17 1324 4     Pain Loc --      Pain Edu? --      Excl. in GC? --    Updated Vital Signs BP (!) 130/93 (BP Location:  Left Arm)   Pulse 92   Temp 98.9 F (37.2 C) (Oral)   Resp 16   Ht 5\' 5"  (1.651 m)   Wt 111 lb (50.3 kg)   SpO2 99%   BMI 18.47 kg/m   Physical Exam  Constitutional: She is oriented to person, place, and time. She appears well-developed. No distress.  HENT:  Head: Normocephalic and atraumatic.  Eyes: Conjunctivae are normal. No scleral icterus.  Cardiovascular: Normal rate and regular rhythm.  No murmur heard. Pulmonary/Chest: Effort normal and breath sounds normal. She has no wheezes. She has no rales.  Abdominal: Soft. She exhibits no distension.  No tenderness elicited on exam.  Neurological: She is alert and oriented to person, place, and time.  Psychiatric: She has a normal mood and affect.  Vitals reviewed.  UC Treatments / Results  Labs (all labs ordered are listed, but only abnormal results are displayed) Labs Reviewed - No data to display  EKG  EKG Interpretation None       Radiology No results found.  Procedures Procedures (including critical care time)  Medications Ordered in UC Medications - No data to display   Initial Impression / Assessment and Plan / UC Course  I have reviewed the triage vital signs and the nursing notes.  Pertinent labs & imaging results that were available during my care of the patient were reviewed by me and considered in my medical decision making (see chart for details).     21 year old female presents with lower abdominal pain.  She has had an extensive workup and has seen GI.  She is well-appearing.  No tenderness elicited on exam.  Supportive care.  Advised to cut back on Zofran which may cause constipation.  MiraLAX as needed.  Final Clinical Impressions(s) / UC Diagnoses   Final diagnoses:  Lower abdominal pain    ED Discharge Orders    None     Controlled Substance Prescriptions [place] Controlled Substance Registry consulted? Not Applicable   Tommie Sams, DO 03/20/17 1424

## 2017-03-20 NOTE — ED Triage Notes (Addendum)
Pt with 4 days of abd pain and feels constipated. Last BM was this am. Has nausea, and feels like stomach is "burning". Pain 4/10 and has crampy feeling. Already seen by GI last week for something seen on CT scan week prior.  Scheduled for US beginning of December.

## 2017-03-20 NOTE — Discharge Instructions (Signed)
See GI if it persists.  Miralax as needed.  Take care  Dr. Adriana Simas

## 2017-03-23 ENCOUNTER — Ambulatory Visit: Payer: Self-pay

## 2017-03-23 ENCOUNTER — Ambulatory Visit: Payer: Self-pay | Admitting: Oncology

## 2017-03-26 ENCOUNTER — Encounter: Payer: Self-pay | Admitting: *Deleted

## 2017-03-26 ENCOUNTER — Ambulatory Visit
Admission: EM | Admit: 2017-03-26 | Discharge: 2017-03-26 | Disposition: A | Discharge status: Home / Self Care | Payer: Medicaid Other | Attending: Family Medicine | Admitting: Family Medicine

## 2017-03-26 DIAGNOSIS — F17210 Nicotine dependence, cigarettes, uncomplicated: Secondary | ICD-10-CM | POA: Insufficient documentation

## 2017-03-26 DIAGNOSIS — R112 Nausea with vomiting, unspecified: Secondary | ICD-10-CM

## 2017-03-26 DIAGNOSIS — G35 Multiple sclerosis: Secondary | ICD-10-CM | POA: Insufficient documentation

## 2017-03-26 DIAGNOSIS — F410 Panic disorder [episodic paroxysmal anxiety] without agoraphobia: Secondary | ICD-10-CM | POA: Insufficient documentation

## 2017-03-26 DIAGNOSIS — F329 Major depressive disorder, single episode, unspecified: Secondary | ICD-10-CM | POA: Insufficient documentation

## 2017-03-26 DIAGNOSIS — R197 Diarrhea, unspecified: Secondary | ICD-10-CM | POA: Insufficient documentation

## 2017-03-26 DIAGNOSIS — N946 Dysmenorrhea, unspecified: Secondary | ICD-10-CM | POA: Insufficient documentation

## 2017-03-26 DIAGNOSIS — F419 Anxiety disorder, unspecified: Secondary | ICD-10-CM | POA: Insufficient documentation

## 2017-03-26 DIAGNOSIS — R45851 Suicidal ideations: Secondary | ICD-10-CM | POA: Insufficient documentation

## 2017-03-26 DIAGNOSIS — G709 Myoneural disorder, unspecified: Secondary | ICD-10-CM | POA: Insufficient documentation

## 2017-03-26 DIAGNOSIS — Z79899 Other long term (current) drug therapy: Secondary | ICD-10-CM | POA: Insufficient documentation

## 2017-03-26 DIAGNOSIS — Z880 Allergy status to penicillin: Secondary | ICD-10-CM | POA: Insufficient documentation

## 2017-03-26 DIAGNOSIS — F1220 Cannabis dependence, uncomplicated: Secondary | ICD-10-CM | POA: Insufficient documentation

## 2017-03-26 LAB — CBC WITH DIFFERENTIAL/PLATELET
BASOS ABS: 0.1 10*3/uL (ref 0–0.1)
BASOS PCT: 1 %
Eosinophils Absolute: 0.3 10*3/uL (ref 0–0.7)
Eosinophils Relative: 4 %
HEMATOCRIT: 43.7 % (ref 35.0–47.0)
HEMOGLOBIN: 15.3 g/dL (ref 12.0–16.0)
LYMPHS PCT: 17 %
Lymphs Abs: 1.2 10*3/uL (ref 1.0–3.6)
MCH: 30.6 pg (ref 26.0–34.0)
MCHC: 35 g/dL (ref 32.0–36.0)
MCV: 87.5 fL (ref 80.0–100.0)
MONOS PCT: 7 %
Monocytes Absolute: 0.5 10*3/uL (ref 0.2–0.9)
NEUTROS ABS: 5.2 10*3/uL (ref 1.4–6.5)
Neutrophils Relative %: 71 %
Platelets: 255 10*3/uL (ref 150–440)
RBC: 5 MIL/uL (ref 3.80–5.20)
RDW: 13.7 % (ref 11.5–14.5)
WBC: 7.3 10*3/uL (ref 3.6–11.0)

## 2017-03-26 LAB — URINALYSIS, COMPLETE (UACMP) WITH MICROSCOPIC
BILIRUBIN URINE: NEGATIVE
Glucose, UA: NEGATIVE mg/dL
KETONES UR: NEGATIVE mg/dL
NITRITE: NEGATIVE
PROTEIN: NEGATIVE mg/dL
Specific Gravity, Urine: 1.015 (ref 1.005–1.030)
pH: 6 (ref 5.0–8.0)

## 2017-03-26 LAB — COMPREHENSIVE METABOLIC PANEL
ALBUMIN: 4.8 g/dL (ref 3.5–5.0)
ALK PHOS: 54 U/L (ref 38–126)
ALT: 16 U/L (ref 14–54)
ANION GAP: 11 (ref 5–15)
AST: 21 U/L (ref 15–41)
BILIRUBIN TOTAL: 1.3 mg/dL — AB (ref 0.3–1.2)
BUN: 11 mg/dL (ref 6–20)
CALCIUM: 9.3 mg/dL (ref 8.9–10.3)
CO2: 21 mmol/L — AB (ref 22–32)
Chloride: 104 mmol/L (ref 101–111)
Creatinine, Ser: 0.72 mg/dL (ref 0.44–1.00)
GFR calc Af Amer: >60 mL/min (ref >60)
GFR calc non Af Amer: >60 mL/min (ref >60)
GLUCOSE: 108 mg/dL — AB (ref 65–99)
Potassium: 3.3 mmol/L — ABNORMAL LOW (ref 3.5–5.1)
SODIUM: 136 mmol/L (ref 135–145)
TOTAL PROTEIN: 7.4 g/dL (ref 6.5–8.1)

## 2017-03-26 LAB — PREGNANCY, URINE: PREG TEST UR: NEGATIVE

## 2017-03-26 MED ORDER — ONDANSETRON 8 MG PO TBDP
8.0000 mg | ORAL_TABLET | Freq: Three times a day (TID) | ORAL | 0 refills | Status: DC | PRN
Start: 2017-03-26 — End: 2017-03-28

## 2017-03-26 MED ORDER — ONDANSETRON 4 MG PO TBDP
4.0000 mg | ORAL_TABLET | Freq: Once | ORAL | Status: AC
  Administered 2017-03-26: 4 mg via ORAL

## 2017-03-26 MED ORDER — HYDROCODONE-ACETAMINOPHEN 5-325 MG PO TABS: ORAL_TABLET | ORAL | 0 refills | Status: DC

## 2017-03-26 NOTE — ED Provider Notes (Signed)
[facility]    CSN: [id number] Arrival date & time: 03/26/17  0803     History   Chief Complaint Chief Complaint  Patient presents with  . Nausea  . Emesis  . Diarrhea    HPI Jennifer Watts is a 21 y.o. female.   21 yo female with a h/o chronic, intermittent abdominal pains, nausea/vomiting and diarrhea presents with a  c/o nausea, vomiting and increased stool frequency for the past week. States stools are formed but increased in frequency. Denies any fevers, chills, hematemesis, hematochezia, melena, dysuria, hematuria. Patient has had recent extensive workup over the past month, including ultrasound and  CT scan of abdomen/pelvis. Has an ultrasound scheduled for tomorrow, ordered by GI.     The history is provided by the patient.  Emesis  Associated symptoms: diarrhea   Diarrhea  Associated symptoms: vomiting     Past Medical History:  Diagnosis Date  . Anxiety   . Depression   . MS (multiple sclerosis) (HCC)   . Neuromuscular disorder [facility](HCC)     Patient Active Problem List   Diagnosis Date Noted  . Panic disorder 07/08/2015  . Suicidal ideation 07/08/2015  . 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 Tobacco use disorder 07/08/2015  . Cannabis use disorder, moderate, dependence (HCC) 07/08/2015  . Dysmenorrhea in adolescent 06/02/2015  . Severe anxiety 10/28/2014  . Multiple sclerosis (HCC) 10/26/2014    Past Surgical History:  Procedure Laterality Date  . TYMPANOSTOMY TUBE PLACEMENT      OB History    No data available       Home Medications    Prior to Admission medications   Medication Sig Start Date End Date Taking? Authorizing Provider  ALPRAZolam Prudy Feeler(XANAX) 0.25 MG tablet Take 0.25 mg by mouth at bedtime as needed for anxiety.   Yes [provider]  Cholecalciferol (VITAMIN D PO) Take 1 tablet by mouth daily.   Yes [provider]  Cyanocobalamin (B-12 PO) Take 1 tablet by  mouth daily.   Yes [provider]  medroxyPROGESTERone (DEPO-PROVERA) 150 MG/ML injection INJECT 1 ML (150 MG TOTAL) INTO THE MUSCLE EVERY 3 (THREE) MONTHS. 11/28/16  Yes Gabriel CirriWicker, Cheryl, NP  SUMAtriptan (IMITREX) 100 MG tablet Take 50-100 mg by mouth daily as needed. 01/08/17  Yes [provider]  traZODone (DESYREL) 100 MG tablet Take 1 tablet (100 mg total) by mouth at bedtime. Patient taking differently: Take 50 mg by mouth at bedtime.  07/10/15  Yes Pucilowska, Jolanta B, MD  citalopram (CELEXA) 40 MG tablet Take 1 tablet (40 mg total) by mouth at bedtime. 07/10/15 03/01/17  Pucilowska, Ellin GoodieJolanta B, MD  HYDROcodone-acetaminophen (NORCO/VICODIN) 5-325 MG tablet 1-2 tabs po q 8 hours prn 03/26/17   Payton Mccallumonty, Shermaine Rivet, MD  ondansetron (ZOFRAN ODT) 8 MG disintegrating tablet Take 1 tablet (8 mg total) every 8 (eight) hours as needed by mouth. 03/26/17   Payton Mccallumonty, Clement Deneault, MD  traMADol (ULTRAM) 50 MG tablet Take 1 tablet (50 mg total) by mouth every 6 (six) hours as needed. 03/01/17 03/01/18  Jene EveryKinner, Robert, MD    Family History Family History  Problem Relation Age of Onset  . Hypertension Maternal Grandmother   . Cancer Paternal Grandfather        throat  . Multiple sclerosis Paternal Grandfather   . Anxiety disorder Mother     Social History Social History   Tobacco Use  . Smoking status: Current Every Day Smoker    Packs/day: 0.50  .  Smokeless tobacco: Never Used  Substance Use Topics  . Alcohol use: No  . Drug use: No     Allergies   Ibuprofen; Penicillins; and Sulfa antibiotics   Review of Systems Review of Systems  Gastrointestinal: Positive for diarrhea and vomiting.     Physical Exam Triage Vital Signs ED Triage Vitals  Enc Vitals Group     BP 03/26/17 0814 129/84     Pulse Rate 03/26/17 0814 76     Resp 03/26/17 0814 16     Temp 03/26/17 0814 98.5 F (36.9 C)     Temp Source 03/26/17 0814 Oral     SpO2 03/26/17 0814 99 %     Weight 03/26/17 0816  115 lb (52.2 kg)     Height 03/26/17 0816 5\' 5"  (1.651 m)     Head Circumference --      Peak Flow --      Pain Score --      Pain Loc --      Pain Edu? --      Excl. in GC? --    No data found.  Updated Vital Signs BP 129/84 (BP Location: Left Arm)   Pulse 76   Temp 98.5 F (36.9 C) (Oral)   Resp 16   Ht 5\' 5"  (1.651 m)   Wt 115 lb (52.2 kg)   SpO2 99%   BMI 19.14 kg/m   Visual Acuity Right Eye Distance:   Left Eye Distance:   Bilateral Distance:    Right Eye Near:   Left Eye Near:    Bilateral Near:     Physical Exam  Constitutional: She appears well-developed and well-nourished. No distress.  Abdominal: Soft. Bowel sounds are normal. She exhibits no distension and no mass. There is tenderness (bilateral lower abdomen/pelvis; no rebound or guarding). There is no rebound and no guarding.  Skin: She is not diaphoretic.  Nursing note and vitals reviewed.    UC Treatments / Results  Labs (all labs ordered are listed, but only abnormal results are displayed) Labs Reviewed  COMPREHENSIVE METABOLIC PANEL - Abnormal; Notable for the following components:      Result Value   Potassium 3.3 (*)    CO2 21 (*)    Glucose, Bld 108 (*)    Total Bilirubin 1.3 (*)    All other components within normal limits  URINALYSIS, COMPLETE (UACMP) WITH MICROSCOPIC - Abnormal; Notable for the following components:   Hgb urine dipstick TRACE (*)    Leukocytes, UA TRACE (*)    Squamous Epithelial / LPF 6-30 (*)    Bacteria, UA RARE (*)    All other components within normal limits  URINE CULTURE  CBC WITH DIFFERENTIAL/PLATELET  PREGNANCY, URINE    EKG  EKG Interpretation None       Radiology No results found.  Procedures Procedures (including critical care time)  Medications Ordered in UC Medications  ondansetron (ZOFRAN-ODT) disintegrating tablet 4 mg (4 mg Oral Given 03/26/17 0906)     Initial Impression / Assessment and Plan / UC Course  I have reviewed the  triage vital signs and the nursing notes.  Pertinent labs & imaging results that were available during my care of the patient were reviewed by me and considered in my medical decision making (see chart for details).      Final Clinical Impressions(s) / UC Diagnoses   Final diagnoses:  Non-intractable vomiting with nausea, unspecified vomiting type    ED Discharge Orders  Ordered    ondansetron (ZOFRAN ODT) 8 MG disintegrating tablet  Every 8 hours PRN     03/26/17 0942    HYDROcodone-acetaminophen (NORCO/VICODIN) 5-325 MG tablet     03/26/17 0942     1. Lab results and diagnosis reviewed with patient and parent 2. rx as per orders above; reviewed possible side effects, interactions, risks and benefits  3. Recommend supportive treatment with increased fluids 4. Follow-up with ultrasound and GI as scheduled and GYN  5. F/U prn if symptoms worsen or don't improve  Controlled Substance Prescriptions [place] Controlled Substance Registry consulted? Not Applicable   Payton Mccallum, MD 03/26/17 [phone]

## 2017-03-26 NOTE — Discharge Instructions (Signed)
Follow up with Gastroenterologist (GI) and Gynecologist

## 2017-03-26 NOTE — ED Triage Notes (Signed)
N/V/D x1 week. Also, lower quadrant abd pain, states "burning" sensation.

## 2017-03-27 ENCOUNTER — Ambulatory Visit
Admission: RE | Admit: 2017-03-27 | Discharge: 2017-03-27 | Disposition: A | Discharge status: Home / Self Care | Payer: Self-pay | Source: Ambulatory Visit | Attending: Gastroenterology | Admitting: Gastroenterology

## 2017-03-27 ENCOUNTER — Other Ambulatory Visit: Payer: Self-pay

## 2017-03-27 ENCOUNTER — Other Ambulatory Visit
Admission: RE | Admit: 2017-03-27 | Discharge: 2017-03-27 | Disposition: A | Discharge status: Home / Self Care | Payer: Self-pay | Source: Ambulatory Visit | Attending: Gastroenterology | Admitting: Gastroenterology

## 2017-03-27 ENCOUNTER — Encounter: Payer: Self-pay | Admitting: Emergency Medicine

## 2017-03-27 ENCOUNTER — Emergency Department
Admission: EM | Admit: 2017-03-27 | Discharge: 2017-03-27 | Disposition: A | Discharge status: Home / Self Care | Payer: Medicaid Other | Attending: Student in an Organized Health Care Education/Training Program | Admitting: Student in an Organized Health Care Education/Training Program

## 2017-03-27 DIAGNOSIS — F419 Anxiety disorder, unspecified: Secondary | ICD-10-CM | POA: Insufficient documentation

## 2017-03-27 DIAGNOSIS — R197 Diarrhea, unspecified: Secondary | ICD-10-CM | POA: Insufficient documentation

## 2017-03-27 DIAGNOSIS — G35 Multiple sclerosis: Secondary | ICD-10-CM | POA: Insufficient documentation

## 2017-03-27 DIAGNOSIS — Z79899 Other long term (current) drug therapy: Secondary | ICD-10-CM | POA: Insufficient documentation

## 2017-03-27 DIAGNOSIS — R112 Nausea with vomiting, unspecified: Secondary | ICD-10-CM | POA: Insufficient documentation

## 2017-03-27 DIAGNOSIS — F329 Major depressive disorder, single episode, unspecified: Secondary | ICD-10-CM | POA: Insufficient documentation

## 2017-03-27 DIAGNOSIS — F17210 Nicotine dependence, cigarettes, uncomplicated: Secondary | ICD-10-CM | POA: Insufficient documentation

## 2017-03-27 DIAGNOSIS — R17 Unspecified jaundice: Secondary | ICD-10-CM | POA: Insufficient documentation

## 2017-03-27 LAB — COMPREHENSIVE METABOLIC PANEL
ALBUMIN: 4.8 g/dL (ref 3.5–5.0)
ALT: 16 U/L (ref 14–54)
ANION GAP: 11 (ref 5–15)
AST: 22 U/L (ref 15–41)
Alkaline Phosphatase: 57 U/L (ref 38–126)
BUN: 11 mg/dL (ref 6–20)
CHLORIDE: 106 mmol/L (ref 101–111)
CO2: 20 mmol/L — AB (ref 22–32)
Calcium: 9.6 mg/dL (ref 8.9–10.3)
Creatinine, Ser: 0.75 mg/dL (ref 0.44–1.00)
GFR calc Af Amer: >60 mL/min (ref >60)
GFR calc non Af Amer: >60 mL/min (ref >60)
GLUCOSE: 100 mg/dL — AB (ref 65–99)
POTASSIUM: 3.3 mmol/L — AB (ref 3.5–5.1)
SODIUM: 137 mmol/L (ref 135–145)
Total Bilirubin: 1.3 mg/dL — ABNORMAL HIGH (ref 0.3–1.2)
Total Protein: 7.6 g/dL (ref 6.5–8.1)

## 2017-03-27 LAB — CBC WITH DIFFERENTIAL/PLATELET
BASOS ABS: 0.1 10*3/uL (ref 0–0.1)
BASOS PCT: 1 %
EOS ABS: 0.2 10*3/uL (ref 0–0.7)
Eosinophils Relative: 3 %
HEMATOCRIT: 43.5 % (ref 35.0–47.0)
Hemoglobin: 15 g/dL (ref 12.0–16.0)
Lymphocytes Relative: 15 %
Lymphs Abs: 1.1 10*3/uL (ref 1.0–3.6)
MCH: 30.7 pg (ref 26.0–34.0)
MCHC: 34.6 g/dL (ref 32.0–36.0)
MCV: 88.7 fL (ref 80.0–100.0)
MONO ABS: 0.5 10*3/uL (ref 0.2–0.9)
Monocytes Relative: 7 %
NEUTROS ABS: 5.3 10*3/uL (ref 1.4–6.5)
Neutrophils Relative %: 74 %
PLATELETS: 257 10*3/uL (ref 150–440)
RBC: 4.91 MIL/uL (ref 3.80–5.20)
RDW: 13.5 % (ref 11.5–14.5)
WBC: 7.1 10*3/uL (ref 3.6–11.0)

## 2017-03-27 LAB — BILIRUBIN, DIRECT: Bilirubin, Direct: 0.2 mg/dL (ref 0.1–0.5)

## 2017-03-27 LAB — LIPASE, BLOOD: Lipase: 34 U/L (ref 11–51)

## 2017-03-27 LAB — BILIRUBIN, TOTAL: BILIRUBIN TOTAL: 0.9 mg/dL (ref 0.3–1.2)

## 2017-03-27 MED ORDER — SODIUM CHLORIDE 0.9 % IV BOLUS (SEPSIS)
1000.0000 mL | Freq: Once | INTRAVENOUS | Status: AC
  Administered 2017-03-27: 1000 mL via INTRAVENOUS

## 2017-03-27 MED ORDER — PROMETHAZINE HCL 25 MG/ML IJ SOLN
12.5000 mg | Freq: Four times a day (QID) | INTRAMUSCULAR | Status: DC | PRN
  Administered 2017-03-27: 12.5 mg via INTRAVENOUS
  Filled 2017-03-27: qty 1

## 2017-03-27 MED ORDER — PROMETHAZINE HCL 12.5 MG PO TABS: 12.5000 mg | ORAL_TABLET | Freq: Four times a day (QID) | ORAL | 0 refills | Status: DC | PRN

## 2017-03-27 NOTE — Discharge Instructions (Signed)

## 2017-03-27 NOTE — ED Provider Notes (Signed)
[facility] Emergency Department Provider Note    First MD Initiated Contact with Patient 03/27/17 [phone]     (approximate)  I have reviewed the triage vital signs and the nursing notes.   HISTORY  Chief Complaint Abdominal Pain; Emesis; and Diarrhea    HPI Jennifer Watts is a 21 y.o. female with a history of anxiety, depression, MS and several weeks of nausea vomiting diarrhea associated with crampy lower abdominal pain.  She states that it seems to happen every time she eats.  Father notes the patient has a very poor diet consisting mostly of fast foods.  Denies any blood in her stools.  No fevers.  States that she feels okay when there is nothing on her stomach.  Denies any bloody emesis.  No family history of IBD or Crohn's.  Denies any vaginal discharge.  Denies any dysuria.  Past Medical History:  Diagnosis Date  . Anxiety   . Depression   . MS (multiple sclerosis) (HCC)   . Neuromuscular disorder (HCC)    Family History  Problem Relation Age of Onset  . Hypertension Maternal Grandmother   . Cancer Paternal Grandfather        throat  . Multiple sclerosis Paternal Grandfather   . Anxiety disorder Mother    Past Surgical History:  Procedure Laterality Date  . TYMPANOSTOMY TUBE PLACEMENT     Patient Active Problem List   Diagnosis Date Noted  . Panic disorder 07/08/2015  . Suicidal ideation 07/08/2015  . 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 Tobacco use disorder 07/08/2015  . Cannabis use disorder, moderate, dependence (HCC) 07/08/2015  . Dysmenorrhea in adolescent 06/02/2015  . Severe anxiety 10/28/2014  . Multiple sclerosis (HCC) 10/26/2014      Prior to Admission medications   Medication Sig Start Date End Date Taking? Authorizing Provider  ALPRAZolam Prudy Feeler) 0.25 MG tablet Take 0.25 mg by mouth at bedtime as needed for anxiety.    [provider]  Cholecalciferol (VITAMIN D PO)  Take 1 tablet by mouth daily.    [provider]  citalopram (CELEXA) 40 MG tablet Take 1 tablet (40 mg total) by mouth at bedtime. 07/10/15 03/01/17  Pucilowska, Braulio Conte B, MD  Cyanocobalamin (B-12 PO) Take 1 tablet by mouth daily.    [provider]  HYDROcodone-acetaminophen (NORCO/VICODIN) 5-325 MG tablet 1-2 tabs po q 8 hours prn 03/26/17   Payton Mccallum, MD  medroxyPROGESTERone (DEPO-PROVERA) 150 MG/ML injection INJECT 1 ML (150 MG TOTAL) INTO THE MUSCLE EVERY 3 (THREE) MONTHS. 11/28/16   Gabriel Cirri, NP  ondansetron (ZOFRAN ODT) 8 MG disintegrating tablet Take 1 tablet (8 mg total) every 8 (eight) hours as needed by mouth. 03/26/17   Payton Mccallum, MD  promethazine (PHENERGAN) 12.5 MG tablet Take 1 tablet (12.5 mg total) every 6 (six) hours as needed by mouth for nausea or vomiting. 03/27/17   Willy Eddy, MD  SUMAtriptan (IMITREX) 100 MG tablet Take 50-100 mg by mouth daily as needed. 01/08/17   [provider]  traMADol (ULTRAM) 50 MG tablet Take 1 tablet (50 mg total) by mouth every 6 (six) hours as needed. 03/01/17 03/01/18  Jene Every, MD  traZODone (DESYREL) 100 MG tablet Take 1 tablet (100 mg total) by mouth at bedtime. Patient taking differently: Take 50 mg by mouth at bedtime.  07/10/15   Pucilowska, Ellin Goodie, MD    Allergies Ibuprofen; Penicillins; and Sulfa antibiotics    Social History Social History  Tobacco Use  . Smoking status: Current Every Day Smoker    Packs/day: 0.50  . Smokeless tobacco: Never Used  Substance Use Topics  . Alcohol use: No  . Drug use: No    Review of Systems Patient denies headaches, rhinorrhea, blurry vision, numbness, shortness of breath, chest pain, edema, cough, abdominal pain, nausea, vomiting, diarrhea, dysuria, fevers, rashes or hallucinations unless otherwise stated above in HPI. ____________________________________________   PHYSICAL EXAM:  VITAL SIGNS: Vitals:   03/27/17 0653  BP:  (!) 139/91  Pulse: 88  Resp: 18  Temp: 97.9 F (36.6 C)  SpO2: 99%    Constitutional: Alert and oriented. Well appearing and in no acute distress. Eyes: Conjunctivae are normal.  Head: Atraumatic. Nose: No congestion/rhinnorhea. Mouth/Throat: Mucous membranes are moist.   Neck: No stridor. Painless ROM.  Cardiovascular: Normal rate, regular rhythm. Grossly normal heart sounds.  Good peripheral circulation. Respiratory: Normal respiratory effort.  No retractions. Lungs CTAB. Gastrointestinal: Soft and nontender. No distention. No abdominal bruits. No CVA tenderness. Genitourinary: deferred Musculoskeletal: No lower extremity tenderness nor edema.  No joint effusions. Neurologic:  Normal speech and language. No gross focal neurologic deficits are appreciated. No facial droop Skin:  Skin is warm, dry and intact. No rash noted. Psychiatric: anxious appearing, appropriate and organized thought process  ____________________________________________   LABS (all labs ordered are listed, but only abnormal results are displayed)  Results for orders placed or performed during the hospital encounter of 03/27/17 (from the past 24 hour(s))  CBC with Differential/Platelet     Status: None   Collection Time: 03/27/17  7:23 AM  Result Value Ref Range   WBC 7.1 3.6 - 11.0 K/uL   RBC 4.91 3.80 - 5.20 MIL/uL   Hemoglobin 15.0 12.0 - 16.0 g/dL   HCT 16.1 09.6 - 04.5 %   MCV 88.7 80.0 - 100.0 fL   MCH 30.7 26.0 - 34.0 pg   MCHC 34.6 32.0 - 36.0 g/dL   RDW 40.9 81.1 - 91.4 %   Platelets 257 150 - 440 K/uL   Neutrophils Relative % 74 %   Neutro Abs 5.3 1.4 - 6.5 K/uL   Lymphocytes Relative 15 %   Lymphs Abs 1.1 1.0 - 3.6 K/uL   Monocytes Relative 7 %   Monocytes Absolute 0.5 0.2 - 0.9 K/uL   Eosinophils Relative 3 %   Eosinophils Absolute 0.2 0 - 0.7 K/uL   Basophils Relative 1 %   Basophils Absolute 0.1 0 - 0.1 K/uL  Comprehensive metabolic panel     Status: Abnormal   Collection Time:  03/27/17  7:23 AM  Result Value Ref Range   Sodium 137 135 - 145 mmol/L   Potassium 3.3 (L) 3.5 - 5.1 mmol/L   Chloride 106 101 - 111 mmol/L   CO2 20 (L) 22 - 32 mmol/L   Glucose, Bld 100 (H) 65 - 99 mg/dL   BUN 11 6 - 20 mg/dL   Creatinine, Ser 7.82 0.44 - 1.00 mg/dL   Calcium 9.6 8.9 - 95.6 mg/dL   Total Protein 7.6 6.5 - 8.1 g/dL   Albumin 4.8 3.5 - 5.0 g/dL   AST 22 15 - 41 U/L   ALT 16 14 - 54 U/L   Alkaline Phosphatase 57 38 - 126 U/L   Total Bilirubin 1.3 (H) 0.3 - 1.2 mg/dL   GFR calc non Af Amer >60 >60 mL/min   GFR calc Af Amer >60 >60 mL/min   Anion gap 11 5 - 15  Lipase, blood     Status: None   Collection Time: 03/27/17  7:23 AM  Result Value Ref Range   Lipase 34 11 - 51 U/L   ____________________________________________ ____________________________________________  RADIOLOGY   ____________________________________________   PROCEDURES  Procedure(s) performed:  Procedures    Critical Care performed: no ____________________________________________   INITIAL IMPRESSION / ASSESSMENT AND PLAN / ED COURSE  Pertinent labs & imaging results that were available during my care of the patient were reviewed by me and considered in my medical decision making (see chart for details).  DDX: IBD, UC, IVS, food allergy, colitis, appendicitis, PID, torsion, pregnancy, menses, UTI  Jennifer Watts is a 21 y.o. who presents to the ED with above symptoms for the past several weeks.  Patient is well-appearing in no acute distress.  Her abdominal exam is soft and benign.  Blood work sent for the above differential.  Seems less consistent with acute appendicitis.  Patient had recent CT imaging with no evidence of IBD or UC.  Patient denies any symptoms of UTI.  Pregnancy test yesterday was negative.  Not consistent with torsion.  Patient has outpatient ultrasound ordered at 830 this morning for evaluation of chronic and known ovarian cysts.  Patient is being followed up with  gastroenterology.  Based on her presentation I do believe that she is stable and appropriate for continued workup as an outpatient.  Discussed strict return precautions.      ____________________________________________   FINAL CLINICAL IMPRESSION(S) / ED DIAGNOSES  Final diagnoses:  Nausea vomiting and diarrhea      NEW MEDICATIONS STARTED DURING THIS VISIT:  This SmartLink is deprecated. Use AVSMEDLIST instead to display the medication list for a patient.   Note:  This document was prepared using Dragon voice recognition software and may include unintentional dictation errors.    Willy Eddyobinson, Lucine Bilski, MD 03/27/17 [phone]

## 2017-03-27 NOTE — ED Notes (Signed)
Pt states she needs to leave by 8:30 due to [place] at [facility], MD aware, pt verbalizes agreement to IV fluids and phenergan

## 2017-03-27 NOTE — ED Triage Notes (Signed)
Patient ambulatory to triage with steady gait, without difficulty or distress noted; pt reports N/V/D x week accomp by lower abd pain

## 2017-03-27 NOTE — ED Notes (Signed)
Pt in NAD at time of d/c, pt ambulatory with mother. Pt verbalizes d/c teaching and RX.

## 2017-03-28 ENCOUNTER — Other Ambulatory Visit: Payer: Self-pay

## 2017-03-28 ENCOUNTER — Observation Stay
Admission: EM | Admit: 2017-03-28 | Discharge: 2017-03-29 | Disposition: A | Discharge status: Home / Self Care | Payer: Self-pay | Attending: Internal Medicine | Admitting: Internal Medicine

## 2017-03-28 DIAGNOSIS — K529 Noninfective gastroenteritis and colitis, unspecified: Secondary | ICD-10-CM | POA: Diagnosis present

## 2017-03-28 DIAGNOSIS — F17210 Nicotine dependence, cigarettes, uncomplicated: Secondary | ICD-10-CM | POA: Insufficient documentation

## 2017-03-28 DIAGNOSIS — Z882 Allergy status to sulfonamides status: Secondary | ICD-10-CM | POA: Insufficient documentation

## 2017-03-28 DIAGNOSIS — G35 Multiple sclerosis: Secondary | ICD-10-CM | POA: Insufficient documentation

## 2017-03-28 DIAGNOSIS — Z880 Allergy status to penicillin: Secondary | ICD-10-CM | POA: Insufficient documentation

## 2017-03-28 DIAGNOSIS — Z888 Allergy status to other drugs, medicaments and biological substances status: Secondary | ICD-10-CM | POA: Insufficient documentation

## 2017-03-28 DIAGNOSIS — A084 Viral intestinal infection, unspecified: Principal | ICD-10-CM | POA: Insufficient documentation

## 2017-03-28 DIAGNOSIS — Z79899 Other long term (current) drug therapy: Secondary | ICD-10-CM | POA: Insufficient documentation

## 2017-03-28 DIAGNOSIS — R197 Diarrhea, unspecified: Secondary | ICD-10-CM

## 2017-03-28 DIAGNOSIS — F329 Major depressive disorder, single episode, unspecified: Secondary | ICD-10-CM | POA: Insufficient documentation

## 2017-03-28 DIAGNOSIS — R112 Nausea with vomiting, unspecified: Secondary | ICD-10-CM

## 2017-03-28 DIAGNOSIS — Z886 Allergy status to analgesic agent status: Secondary | ICD-10-CM | POA: Insufficient documentation

## 2017-03-28 DIAGNOSIS — F419 Anxiety disorder, unspecified: Secondary | ICD-10-CM | POA: Insufficient documentation

## 2017-03-28 LAB — URINE DRUG SCREEN, QUALITATIVE (ARMC ONLY)
AMPHETAMINES, UR SCREEN: NOT DETECTED
Barbiturates, Ur Screen: NOT DETECTED
Benzodiazepine, Ur Scrn: NOT DETECTED
COCAINE METABOLITE, UR ~~LOC~~: NOT DETECTED
Cannabinoid 50 Ng, Ur ~~LOC~~: POSITIVE — AB
MDMA (ECSTASY) UR SCREEN: NOT DETECTED
Methadone Scn, Ur: NOT DETECTED
OPIATE, UR SCREEN: POSITIVE — AB
PHENCYCLIDINE (PCP) UR S: NOT DETECTED
Tricyclic, Ur Screen: NOT DETECTED

## 2017-03-28 LAB — CBC WITH DIFFERENTIAL/PLATELET
Basophils Absolute: 0.1 10*3/uL (ref 0–0.1)
Basophils Relative: 1 %
EOS ABS: 0.2 10*3/uL (ref 0–0.7)
Eosinophils Relative: 4 %
HEMATOCRIT: 40.9 % (ref 35.0–47.0)
HEMOGLOBIN: 14.1 g/dL (ref 12.0–16.0)
LYMPHS ABS: 1.2 10*3/uL (ref 1.0–3.6)
Lymphocytes Relative: 23 %
MCH: 30.6 pg (ref 26.0–34.0)
MCHC: 34.3 g/dL (ref 32.0–36.0)
MCV: 89.2 fL (ref 80.0–100.0)
MONO ABS: 0.4 10*3/uL (ref 0.2–0.9)
MONOS PCT: 8 %
NEUTROS PCT: 64 %
Neutro Abs: 3.6 10*3/uL (ref 1.4–6.5)
Platelets: 229 10*3/uL (ref 150–440)
RBC: 4.59 MIL/uL (ref 3.80–5.20)
RDW: 13.9 % (ref 11.5–14.5)
WBC: 5.5 10*3/uL (ref 3.6–11.0)

## 2017-03-28 LAB — URINALYSIS, COMPLETE (UACMP) WITH MICROSCOPIC
Bacteria, UA: NONE SEEN
Bilirubin Urine: NEGATIVE
GLUCOSE, UA: NEGATIVE mg/dL
HGB URINE DIPSTICK: NEGATIVE
Ketones, ur: NEGATIVE mg/dL
NITRITE: NEGATIVE
PH: 6 (ref 5.0–8.0)
Protein, ur: NEGATIVE mg/dL
SPECIFIC GRAVITY, URINE: 1.011 (ref 1.005–1.030)

## 2017-03-28 LAB — COMPREHENSIVE METABOLIC PANEL
ALK PHOS: 51 U/L (ref 38–126)
ALT: 16 U/L (ref 14–54)
ANION GAP: 10 (ref 5–15)
AST: 20 U/L (ref 15–41)
Albumin: 4.7 g/dL (ref 3.5–5.0)
BILIRUBIN TOTAL: 1.3 mg/dL — AB (ref 0.3–1.2)
BUN: 8 mg/dL (ref 6–20)
CALCIUM: 9.7 mg/dL (ref 8.9–10.3)
CO2: 20 mmol/L — ABNORMAL LOW (ref 22–32)
Chloride: 107 mmol/L (ref 101–111)
Creatinine, Ser: 0.67 mg/dL (ref 0.44–1.00)
GFR calc non Af Amer: >60 mL/min (ref >60)
Glucose, Bld: 90 mg/dL (ref 65–99)
POTASSIUM: 3.4 mmol/L — AB (ref 3.5–5.1)
SODIUM: 137 mmol/L (ref 135–145)
TOTAL PROTEIN: 7 g/dL (ref 6.5–8.1)

## 2017-03-28 LAB — LIPASE, BLOOD: Lipase: 24 U/L (ref 11–51)

## 2017-03-28 LAB — POCT PREGNANCY, URINE: Preg Test, Ur: NEGATIVE

## 2017-03-28 LAB — URINE CULTURE: Special Requests: NORMAL

## 2017-03-28 MED ORDER — METOCLOPRAMIDE HCL 5 MG/ML IJ SOLN
10.0000 mg | Freq: Once | INTRAMUSCULAR | Status: DC
  Filled 2017-03-28: qty 2

## 2017-03-28 MED ORDER — ONDANSETRON HCL 4 MG PO TABS: 4.0000 mg | ORAL_TABLET | Freq: Four times a day (QID) | ORAL | Status: DC | PRN

## 2017-03-28 MED ORDER — FAMOTIDINE IN NACL 20-0.9 MG/50ML-% IV SOLN
INTRAVENOUS | Status: AC
  Filled 2017-03-28: qty 50

## 2017-03-28 MED ORDER — SUMATRIPTAN SUCCINATE 50 MG PO TABS
50.0000 mg | ORAL_TABLET | Freq: Every day | ORAL | Status: DC | PRN
  Administered 2017-03-29: 50 mg via ORAL
  Filled 2017-03-28 (×3): qty 1

## 2017-03-28 MED ORDER — MORPHINE SULFATE (PF) 4 MG/ML IV SOLN
4.0000 mg | Freq: Once | INTRAVENOUS | Status: AC
  Administered 2017-03-28: 4 mg via INTRAVENOUS
  Filled 2017-03-28: qty 1

## 2017-03-28 MED ORDER — PROMETHAZINE HCL 25 MG/ML IJ SOLN
25.0000 mg | Freq: Once | INTRAMUSCULAR | Status: AC
  Administered 2017-03-28: 25 mg via INTRAVENOUS
  Filled 2017-03-28: qty 1

## 2017-03-28 MED ORDER — ALPRAZOLAM 0.5 MG PO TABS
0.2500 mg | ORAL_TABLET | Freq: Once | ORAL | Status: AC
  Administered 2017-03-28: 0.25 mg via ORAL
  Filled 2017-03-28: qty 1

## 2017-03-28 MED ORDER — OXYCODONE HCL 5 MG PO TABS: 5.0000 mg | ORAL_TABLET | ORAL | Status: DC | PRN

## 2017-03-28 MED ORDER — ALPRAZOLAM 0.5 MG PO TABS
0.2500 mg | ORAL_TABLET | Freq: Every evening | ORAL | Status: DC | PRN
  Filled 2017-03-28: qty 1

## 2017-03-28 MED ORDER — ONDANSETRON HCL 4 MG/2ML IJ SOLN
4.0000 mg | Freq: Once | INTRAMUSCULAR | Status: DC
  Filled 2017-03-28: qty 2

## 2017-03-28 MED ORDER — ONDANSETRON HCL 4 MG/2ML IJ SOLN
4.0000 mg | Freq: Four times a day (QID) | INTRAMUSCULAR | Status: DC | PRN
  Administered 2017-03-29: 4 mg via INTRAVENOUS
  Filled 2017-03-28: qty 2

## 2017-03-28 MED ORDER — ENOXAPARIN SODIUM 40 MG/0.4ML ~~LOC~~ SOLN: 40.0000 mg | SUBCUTANEOUS | Status: DC

## 2017-03-28 MED ORDER — TRAZODONE HCL 50 MG PO TABS
50.0000 mg | ORAL_TABLET | Freq: Every day | ORAL | Status: DC
  Administered 2017-03-28: 50 mg via ORAL
  Filled 2017-03-28: qty 1

## 2017-03-28 MED ORDER — FAMOTIDINE IN NACL 20-0.9 MG/50ML-% IV SOLN
20.0000 mg | Freq: Two times a day (BID) | INTRAVENOUS | Status: DC
  Administered 2017-03-28 – 2017-03-29 (×3): 20 mg via INTRAVENOUS
  Filled 2017-03-28 (×3): qty 50

## 2017-03-28 MED ORDER — MAGNESIUM SULFATE 2 GM/50ML IV SOLN
2.0000 g | Freq: Once | INTRAVENOUS | Status: AC
  Administered 2017-03-28: 2 g via INTRAVENOUS

## 2017-03-28 MED ORDER — MAGNESIUM SULFATE 2 GM/50ML IV SOLN
INTRAVENOUS | Status: AC
  Administered 2017-03-28: 2 g via INTRAVENOUS
  Filled 2017-03-28: qty 50

## 2017-03-28 MED ORDER — POTASSIUM CHLORIDE IN NACL 20-0.9 MEQ/L-% IV SOLN
INTRAVENOUS | Status: DC
  Administered 2017-03-28 – 2017-03-29 (×2): via INTRAVENOUS
  Filled 2017-03-28 (×3): qty 1000

## 2017-03-28 MED ORDER — SODIUM CHLORIDE 0.9 % IV SOLN
Freq: Once | INTRAVENOUS | Status: AC
  Administered 2017-03-28: 08:00:00 via INTRAVENOUS

## 2017-03-28 MED ORDER — TRAZODONE HCL 50 MG PO TABS: 100.0000 mg | ORAL_TABLET | Freq: Every day | ORAL | Status: DC

## 2017-03-28 MED ORDER — CITALOPRAM HYDROBROMIDE 20 MG PO TABS
40.0000 mg | ORAL_TABLET | Freq: Every day | ORAL | Status: DC
  Administered 2017-03-28: 23:00:00 40 mg via ORAL
  Filled 2017-03-28: qty 2

## 2017-03-28 MED ORDER — POTASSIUM CHLORIDE IN NACL 20-0.9 MEQ/L-% IV SOLN
INTRAVENOUS | Status: AC
  Filled 2017-03-28: qty 1000

## 2017-03-28 MED ORDER — ACETAMINOPHEN 325 MG PO TABS: 650.0000 mg | ORAL_TABLET | Freq: Four times a day (QID) | ORAL | Status: DC | PRN

## 2017-03-28 MED ORDER — MORPHINE SULFATE (PF) 2 MG/ML IV SOLN
2.0000 mg | INTRAVENOUS | Status: DC | PRN
  Administered 2017-03-28 – 2017-03-29 (×2): 2 mg via INTRAVENOUS
  Filled 2017-03-28 (×3): qty 1

## 2017-03-28 MED ORDER — TRAZODONE HCL 50 MG PO TABS
50.0000 mg | ORAL_TABLET | Freq: Once | ORAL | Status: AC
  Administered 2017-03-28: 23:00:00 50 mg via ORAL
  Filled 2017-03-28 (×2): qty 1

## 2017-03-28 MED ORDER — ACETAMINOPHEN 650 MG RE SUPP: 650.0000 mg | Freq: Four times a day (QID) | RECTAL | Status: DC | PRN

## 2017-03-28 NOTE — Plan of Care (Signed)
  Progressing Education: Knowledge of General Education information will improve 03/28/2017 1826 - Progressing by Luretha Murphy, RN Clinical Measurements: Ability to maintain clinical measurements within normal limits will improve 03/28/2017 1826 - Progressing by Luretha Murphy, RN Will remain free from infection 03/28/2017 1826 - Progressing by Luretha Murphy, RN Note Still waiting on stool to lab for poss c diff.  No n/v at present  since arrival to floor will cont to moniter  family at bedside Activity: Risk for activity intolerance will decrease 03/28/2017 1826 - Progressing by Luretha Murphy, RN Nutrition: Adequate nutrition will be maintained 03/28/2017 1826 - Progressing by Luretha Murphy, RN Coping: Level of anxiety will decrease 03/28/2017 1826 - Progressing by Luretha Murphy, RN Elimination: Will not experience complications related to bowel motility 03/28/2017 1826 - Progressing by Luretha Murphy, RN Pain Managment: General experience of comfort will improve 03/28/2017 1826 - Progressing by Luretha Murphy, RN Safety: Ability to remain free from injury will improve 03/28/2017 1826 - Progressing by Luretha Murphy, RN Fluid Volume: Maintenance of adequate hydration will improve 03/28/2017 1826 - Progressing by Luretha Murphy, RN Nutritional: Achievement of adequate weight for body size and type will improve 03/28/2017 1826 - Progressing by Luretha Murphy, RN

## 2017-03-28 NOTE — ED Notes (Signed)
EDP at bedside  

## 2017-03-28 NOTE — ED Notes (Signed)
Pt resting in bed, family at bedside, pt refusing IV fluids at this time, pt given sprite

## 2017-03-28 NOTE — ED Provider Notes (Signed)
[facility] Emergency Department Provider Note       Time seen: ----------------------------------------- 7:37 AM on 03/28/2017 -----------------------------------------    I have reviewed the triage vital signs and the nursing notes.  HISTORY   Chief Complaint Abdominal Pain    HPI Jennifer Watts is a 21 y.o. female with a history of MS, anxiety and depression who presents to the ED for lower abdominal pain with nausea, vomiting and diarrhea for the past 2 weeks.  Patient was seen here and urgent care multiple times with no diagnosis.  Patient reports her symptoms are not getting any better.  Pain is 8 out of 10 currently, nothing makes it better or worse.  She has also been seen by GI without any specific diagnosis.  Ultrasound was performed of the abdomen yesterday.  Past Medical History:  Diagnosis Date  . Anxiety   . Depression   . MS (multiple sclerosis) (HCC)   . Neuromuscular disorder [facility](HCC)     Patient Active Problem List   Diagnosis Date Noted  . Panic disorder 07/08/2015  . Suicidal ideation 07/08/2015  . 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 Tobacco use disorder 07/08/2015  . Cannabis use disorder, moderate, dependence (HCC) 07/08/2015  . Dysmenorrhea in adolescent 06/02/2015  . Severe anxiety 10/28/2014  . Multiple sclerosis (HCC) 10/26/2014    Past Surgical History:  Procedure Laterality Date  . TYMPANOSTOMY TUBE PLACEMENT      Allergies Ibuprofen; Penicillins; and Sulfa antibiotics  Social History Social History   Tobacco Use  . Smoking status: Current Every Day Smoker    Packs/day: 0.50  . Smokeless tobacco: Never Used  Substance Use Topics  . Alcohol use: No  . Drug use: No    Review of Systems Constitutional: Negative for fever. Eyes: Negative for vision changes ENT:  Negative for congestion, sore throat Cardiovascular: Negative for chest pain. Respiratory: Negative  for shortness of breath. Gastrointestinal: Positive for abdominal pain, vomiting and diarrhea Genitourinary: Negative for dysuria. Musculoskeletal: Negative for back pain. Skin: Negative for rash. Neurological: Negative for headaches, focal weakness or numbness.  All systems negative/normal/unremarkable except as stated in the HPI  ____________________________________________   PHYSICAL EXAM:  VITAL SIGNS: ED Triage Vitals  Enc Vitals Group     BP 03/28/17 0719 (!) 140/105     Pulse Rate 03/28/17 0719 85     Resp 03/28/17 0719 16     Temp 03/28/17 0719 97.9 F (36.6 C)     Temp Source 03/28/17 0719 Oral     SpO2 03/28/17 0719 100 %     Weight 03/28/17 0719 115 lb (52.2 kg)     Height 03/28/17 0719 5\' 5"  (1.651 m)     Head Circumference --      Peak Flow --      Pain Score 03/28/17 0718 8     Pain Loc --      Pain Edu? --      Excl. in GC? --     Constitutional: Alert and oriented. Well appearing and in no distress. Eyes: Conjunctivae are normal. Normal extraocular movements. ENT   Head: Normocephalic and atraumatic.   Nose: No congestion/rhinnorhea.   Mouth/Throat: Mucous membranes are moist.   Neck: No stridor. Cardiovascular: Normal rate, regular rhythm. No murmurs, rubs, or gallops. Respiratory: Normal respiratory effort without tachypnea nor retractions. Breath sounds are clear and equal bilaterally. No wheezes/rales/rhonchi. Gastrointestinal: Soft and nontender. Normal bowel sounds Musculoskeletal: Nontender with normal range  of motion in extremities. No lower extremity tenderness nor edema. Neurologic:  Normal speech and language. No gross focal neurologic deficits are appreciated.  Skin:  Skin is warm, dry and intact. No rash noted. Psychiatric: Mood and affect are normal. Speech and behavior are normal.  ____________________________________________  ED COURSE:  Pertinent labs & imaging results that were available during my care of the patient  were reviewed by me and considered in my medical decision making (see chart for details). Patient presents for abdominal pain, vomiting and diarrhea, we will assess with labs and imaging as indicated.   Procedures ____________________________________________   LABS (pertinent positives/negatives)  Labs Reviewed  COMPREHENSIVE METABOLIC PANEL - Abnormal; Notable for the following components:      Result Value   Potassium 3.4 (*)    CO2 20 (*)    Total Bilirubin 1.3 (*)    All other components within normal limits  URINALYSIS, COMPLETE (UACMP) WITH MICROSCOPIC - Abnormal; Notable for the following components:   Color, Urine YELLOW (*)    APPearance CLEAR (*)    Leukocytes, UA TRACE (*)    Squamous Epithelial / LPF 0-5 (*)    All other components within normal limits  GASTROINTESTINAL PANEL BY PCR, STOOL (REPLACES STOOL CULTURE)  C DIFFICILE QUICK SCREEN W PCR REFLEX  CBC WITH DIFFERENTIAL/PLATELET  LIPASE, BLOOD  POC URINE PREG, ED  POCT PREGNANCY, URINE   ____________________________________________  DIFFERENTIAL DIAGNOSIS   Gastroenteritis, electrolyte abnormality, dehydration, C. difficile colitis, pregnancy  FINAL ASSESSMENT AND PLAN  Vomiting and diarrhea   Plan: Patient had presented for persistent vomiting and diarrhea. Patient's labs are reassuring. Patient's imaging performed recently was also negative.  Given the fact that her symptoms are intractable and she has had multiple recent visits, I will discussed with the hospitalist for observation and GI consultation in the hospital.   Emily Filbert, MD   Note: This note was generated in part or whole with voice recognition software. Voice recognition is usually quite accurate but there are transcription errors that can and very often do occur. I apologize for any typographical errors that were not detected and corrected.     Emily Filbert, MD 03/28/17 [phone]

## 2017-03-28 NOTE — ED Triage Notes (Addendum)
Pt c/o lower abd pain with N/V/D, watery diarrhea for the past 2 weeks,. States she was seen here and urgent care multiple times with no Dx. States her sx are not getting any better..pt has a Positive Hep A result from 03/09/2017 at [facility]

## 2017-03-28 NOTE — H&P (Addendum)
[facility] [place] at [facility]   PATIENT NAME: Jennifer Watts    MR#:  [id number]  DATE OF BIRTH:  03/16/1996  DATE OF ADMISSION:  03/28/2017  PRIMARY CARE PHYSICIAN: Gabriel CirriWicker, Cheryl, NP   REQUESTING/REFERRING PHYSICIAN: Dr Presley RaddleJohnathan Williams  CHIEF COMPLAINT:   Chief Complaint  Patient presents with  . Abdominal Pain    HISTORY OF PRESENT ILLNESS:  Jennifer Watts  is a 21 y.o. female with a known history of MS on Rituxan every six months.  Patient presents back to the emergency room with nausea vomiting and diarrhea.  She states that she is having diarrhea 5-6 times a day very watery in nature.  No blood in the bowel movements.  She is vomiting 1 or 2 times per day but a lot of dry heaving.  She is having abdominal cramping rated at a 4-5 out of 10 in intensity.  It comes and goes.  Relieved with bowel movement.  Patient had a CAT scan of the abdomen 03/01/2017 that showed a ruptured ovarian cyst and was given 2 antibiotics and some prednisone at that time.No recent travel out of the country. Mother stated she was diagnosed with hepatitis A. Other hepatitis profiles are negative.Patient still having a lot of symptoms at this time.  PAST MEDICAL HISTORY:   Past Medical History:  Diagnosis Date  . Anxiety   . Depression   . MS (multiple sclerosis) (HCC)   . Neuromuscular disorder (HCC)     PAST SURGICAL HISTORY:   Past Surgical History:  Procedure Laterality Date  . TYMPANOSTOMY TUBE PLACEMENT      SOCIAL HISTORY:   Social History   Tobacco Use  . Smoking status: Current Every Day Smoker    Packs/day: 0.50  . Smokeless tobacco: Never Used  Substance Use Topics  . Alcohol use: No    FAMILY HISTORY:   Family History  Problem Relation Age of Onset  . Hypertension Maternal Grandmother   . Cancer Paternal Grandfather        throat  . Multiple sclerosis Paternal Grandfather   . Anxiety disorder Mother     DRUG ALLERGIES:    Allergies  Allergen Reactions  . Reglan [Metoclopramide]   . Ibuprofen Rash  . Penicillins Rash    Has patient had a PCN reaction causing immediate rash, facial/tongue/throat swelling, SOB or lightheadedness with hypotension: No Has patient had a PCN reaction causing severe rash involving mucus membranes or skin necrosis: No Has patient had a PCN reaction that required hospitalization: No Has patient had a PCN reaction occurring within the last 10 years: No If all of the above answers are "NO", then may proceed with Cephalosporin use.   . Sulfa Antibiotics Rash    REVIEW OF SYSTEMS:  CONSTITUTIONAL: No fever, positive for chills. Positive for fatigue. Positive for weight loss EYES: No blurred or double vision. History of optic neuritis EARS, NOSE, AND THROAT: No tinnitus or ear pain. No sore throat RESPIRATORY: No cough, shortness of breath, wheezing or hemoptysis.  CARDIOVASCULAR: No chest pain, orthopnea, edema.  GASTROINTESTINAL: Positive for nausea, vomiting, diarrhea and abdominal pain. No blood in bowel movements GENITOURINARY: No dysuria, hematuria.  ENDOCRINE: No polyuria, nocturia,  HEMATOLOGY: No anemia, easy bruising or bleeding SKIN: No rash or lesion. MUSCULOSKELETAL: History of joint pain.   NEUROLOGIC: No tingling, numbness, weakness.  PSYCHIATRY: History of anxiety And depression.   MEDICATIONS AT HOME:   Prior to Admission medications   Medication Sig Start Date End Date  Taking? Authorizing Provider  ALPRAZolam Prudy Feeler) 0.25 MG tablet Take 0.25 mg by mouth at bedtime as needed for anxiety.    [provider]  Cholecalciferol (VITAMIN D PO) Take 1 tablet by mouth daily.    [provider]  citalopram (CELEXA) 40 MG tablet Take 1 tablet (40 mg total) by mouth at bedtime. 07/10/15 03/01/17  Pucilowska, Braulio Conte B, MD  Cyanocobalamin (B-12 PO) Take 1 tablet by mouth daily.    [provider]  HYDROcodone-acetaminophen (NORCO/VICODIN) 5-325  MG tablet 1-2 tabs po q 8 hours prn 03/26/17   Payton Mccallum, MD  medroxyPROGESTERone (DEPO-PROVERA) 150 MG/ML injection INJECT 1 ML (150 MG TOTAL) INTO THE MUSCLE EVERY 3 (THREE) MONTHS. 11/28/16   Gabriel Cirri, NP  ondansetron (ZOFRAN ODT) 8 MG disintegrating tablet Take 1 tablet (8 mg total) every 8 (eight) hours as needed by mouth. 03/26/17   Payton Mccallum, MD  promethazine (PHENERGAN) 12.5 MG tablet Take 1 tablet (12.5 mg total) every 6 (six) hours as needed by mouth for nausea or vomiting. 03/27/17   Willy Eddy, MD  SUMAtriptan (IMITREX) 100 MG tablet Take 50-100 mg by mouth daily as needed. 01/08/17   [provider]  traMADol (ULTRAM) 50 MG tablet Take 1 tablet (50 mg total) by mouth every 6 (six) hours as needed. 03/01/17 03/01/18  Jene Every, MD  traZODone (DESYREL) 100 MG tablet Take 1 tablet (100 mg total) by mouth at bedtime. Patient taking differently: Take 50 mg by mouth at bedtime.  07/10/15   Pucilowska, Braulio Conte B, MD      VITAL SIGNS:  Blood pressure (!) 132/107, pulse 88, temperature 97.9 F (36.6 C), temperature source Oral, resp. rate 16, height 5\' 5"  (1.651 m), weight 52.2 kg (115 lb), SpO2 97 %.  PHYSICAL EXAMINATION:  GENERAL:  21 y.o.-year-old patient lying in the bed with no acute distress.  EYES: Pupils equal, round, reactive to light and accommodation. No scleral icterus. Extraocular muscles intact.  HEENT: Head atraumatic, normocephalic. Oropharynx and nasopharynx clear.  NECK:  Supple, no jugular venous distention. No thyroid enlargement, no tenderness.  LUNGS: Normal breath sounds bilaterally, no wheezing, rales,rhonchi or crepitation. No use of accessory muscles of respiration.  CARDIOVASCULAR: S1, S2 normal. No murmurs, rubs, or gallops.  ABDOMEN: Soft, Lower abdominal tenderness, nondistended. Bowel sounds present. No organomegaly or mass.  EXTREMITIES: No pedal edema, cyanosis, or clubbing.  NEUROLOGIC: Cranial nerves II through XII are  intact. Muscle strength 5/5 in all extremities. Sensation intact. Gait not checked.  PSYCHIATRIC: The patient is alert and oriented x 3.  SKIN: No rash, lesion, or ulcer.   LABORATORY PANEL:   CBC Recent Labs  Lab 03/28/17 0805  WBC 5.5  HGB 14.1  HCT 40.9  PLT 229   ------------------------------------------------------------------------------------------------------------------  Chemistries  Recent Labs  Lab 03/28/17 0805  NA 137  K 3.4*  CL 107  CO2 20*  GLUCOSE 90  BUN 8  CREATININE 0.67  CALCIUM 9.7  AST 20  ALT 16  ALKPHOS 51  BILITOT 1.3*   ------------------------------------------------------------------------------------------------------------------   RADIOLOGY:  US Abdomen Complete  Result Date: 03/27/2017 CLINICAL DATA:  Elevated bilirubin level. EXAM: ABDOMEN ULTRASOUND COMPLETE COMPARISON:  CT scan of March 01, 2017. FINDINGS: Gallbladder: No gallstones or wall thickening visualized. No sonographic Murphy sign noted by sonographer. Common bile duct: Diameter: 3 mm which is within normal limits. Liver: No focal lesion identified. Within normal limits in parenchymal echogenicity. Portal vein is patent on color Doppler imaging with normal direction of  blood flow towards the liver. IVC: No abnormality visualized. Pancreas: Visualized portion unremarkable. Spleen: Size and appearance within normal limits. Right Kidney: Length: 10.3 cm. Echogenicity within normal limits. No mass or hydronephrosis visualized. Left Kidney: Length: 10.7 cm. Echogenicity within normal limits. No mass or hydronephrosis visualized. Abdominal aorta: No aneurysm visualized. Other findings: None. IMPRESSION: No definite abnormality seen in the abdomen. Electronically Signed   By: Lupita Raider, M.D.   On: 03/27/2017 10:00      IMPRESSION AND PLAN:   1.  Gastroenteritis.  Stool studies ordered.  Supportive care with IV fluid hydration, as needed nausea medications and as needed pain  medications.  Start clear liquid diet.  If no improvement and if stool studies are negative may need GI consultation. Send off urine drug toxicology. 2.  History of MS on Rituxan as outpatient 3.  Anxiety depression on trazodone and Celexa and Xanax. 4.  Tobacco abuse smoking cessation counseling done 4 minutes by me.  I will order a nicotine patch as needed but patient said she will without it for now.    All the records are reviewed and case discussed with ED provider. Management plans discussed with the patient, family and they are in agreement.  CODE STATUS: Full code  TOTAL TIME TAKING CARE OF THIS PATIENT: 50 minutes.    Alford Highland M.D on 03/28/2017 at 11:52 AM  Between 7am to 6pm - Pager - [phone]  After 6pm call admission pager [phone]  [facility] Office  [phone]  CC: Primary care physician; Gabriel Cirri, NP

## 2017-03-29 LAB — BASIC METABOLIC PANEL
Anion gap: 5 (ref 5–15)
BUN: 6 mg/dL (ref 6–20)
CALCIUM: 8.4 mg/dL — AB (ref 8.9–10.3)
CHLORIDE: 113 mmol/L — AB (ref 101–111)
CO2: 22 mmol/L (ref 22–32)
CREATININE: 0.64 mg/dL (ref 0.44–1.00)
GFR calc non Af Amer: >60 mL/min (ref >60)
Glucose, Bld: 87 mg/dL (ref 65–99)
Potassium: 3.5 mmol/L (ref 3.5–5.1)
SODIUM: 140 mmol/L (ref 135–145)

## 2017-03-29 LAB — MAGNESIUM: Magnesium: 2 mg/dL (ref 1.7–2.4)

## 2017-03-29 LAB — CBC
HCT: 36.1 % (ref 35.0–47.0)
Hemoglobin: 12.4 g/dL (ref 12.0–16.0)
MCH: 30.7 pg (ref 26.0–34.0)
MCHC: 34.2 g/dL (ref 32.0–36.0)
MCV: 89.8 fL (ref 80.0–100.0)
Platelets: 153 10*3/uL (ref 150–440)
RBC: 4.02 MIL/uL (ref 3.80–5.20)
RDW: 13.9 % (ref 11.5–14.5)
WBC: 3.5 10*3/uL — AB (ref 3.6–11.0)

## 2017-03-29 MED ORDER — PROBIOTIC 250 MG PO CAPS: 250.0000 mg | ORAL_CAPSULE | Freq: Two times a day (BID) | ORAL | 0 refills | Status: DC

## 2017-03-29 MED ORDER — CITALOPRAM HYDROBROMIDE 40 MG PO TABS: 40.0000 mg | ORAL_TABLET | Freq: Every day | ORAL | Status: DC

## 2017-03-29 NOTE — Discharge Summary (Signed)
[facility] - [place] at [facility]   PATIENT NAME: Jennifer Watts    MR#:  [id number]  DATE OF BIRTH:  [birth year]  DATE OF ADMISSION:  03/28/2017 ADMITTING PHYSICIAN: Alford Highland, MD  DATE OF DISCHARGE: 03/29/2017  PRIMARY CARE PHYSICIAN: Gabriel Cirri, NP    ADMISSION DIAGNOSIS:  Nausea vomiting and diarrhea [R11.2, R19.7]  DISCHARGE DIAGNOSIS:  Active Problems:   Gastroenteritis   SECONDARY DIAGNOSIS:   Past Medical History:  Diagnosis Date  . Anxiety   . Depression   . MS (multiple sclerosis) (HCC)   . Neuromuscular disorder [facility])     HOSPITAL COURSE:   21 year old female with a history of multiple sclerosis who presents with abdominal cramping, nausea, vomiting and diarrhea.  1. Viral gastroenteritis: Patient symptoms improved. She has been having symptoms similar to this further over the past 1-2 weeks. I discussed with the patient and patient's father that if her symptoms completely do not improve in 1 week and she should have GI follow-up to evaluate for underlying irritable bowel syndrome. She was also on antibiotics for 2 weeks for PID. Antibiotics can certainly cause abdominal cramping, nausea vomiting and diarrhea as well. I have spoken to her about taking probiotics as well as trying to refrain from lactulose for a week to see if this improves her symptoms. She is tolerating clear liquids and a bland diet.  2. History of MS: Patient will follow-up with Dr. Malvin Johns as an outpatient.  3. Anxiety and depression: Patient will continue trazodone, Celexa and Xanax when necessary.  4.Tobacco dependence: Patient is encouraged to quit smoking. Counseling was provided for 4 minutes.   DISCHARGE CONDITIONS AND DIET:  Stable  Bland diet  CONSULTS OBTAINED:    DRUG ALLERGIES:   Allergies  Allergen Reactions  . Reglan [Metoclopramide] Other (See Comments)    Hyperactivity /becomes mean  . Ibuprofen Rash  . Penicillins Rash    Has patient  had a PCN reaction causing immediate rash, facial/tongue/throat swelling, SOB or lightheadedness with hypotension: No Has patient had a PCN reaction causing severe rash involving mucus membranes or skin necrosis: No Has patient had a PCN reaction that required hospitalization: No Has patient had a PCN reaction occurring within the last 10 years: No If all of the above answers are "NO", then may proceed with Cephalosporin use.   . Sulfa Antibiotics Rash    DISCHARGE MEDICATIONS:   Current Discharge Medication List    START taking these medications   Details  Saccharomyces boulardii (PROBIOTIC) 250 MG CAPS Take 250 mg 2 (two) times daily by mouth. Qty: 14 capsule, Refills: 0      CONTINUE these medications which have CHANGED   Details  citalopram (CELEXA) 40 MG tablet Take 1 tablet (40 mg total) at bedtime by mouth.      CONTINUE these medications which have NOT CHANGED   Details  Cholecalciferol (VITAMIN D PO) Take 1 tablet by mouth daily.    Cyanocobalamin (B-12 PO) Take 1 tablet by mouth daily.    medroxyPROGESTERone (DEPO-PROVERA) 150 MG/ML injection INJECT 1 ML (150 MG TOTAL) INTO THE MUSCLE EVERY 3 (THREE) MONTHS. Qty: 1 mL, Refills: 4    promethazine (PHENERGAN) 12.5 MG tablet Take 1 tablet (12.5 mg total) every 6 (six) hours as needed by mouth for nausea or vomiting. Qty: 12 tablet, Refills: 0    traZODone (DESYREL) 100 MG tablet Take 1 tablet (100 mg total) by mouth at bedtime. Qty: 30 tablet, Refills: 0    ALPRAZolam (  XANAX) 0.25 MG tablet Take 0.25 mg by mouth at bedtime as needed for anxiety.    SUMAtriptan (IMITREX) 100 MG tablet Take 50-100 mg by mouth daily as needed.          Today   CHIEF COMPLAINT:   Abdominal pain, nausea vomiting diarrhea improved.   VITAL SIGNS:  Blood pressure 123/76, pulse 85, temperature 98.3 F (36.8 C), temperature source Oral, resp. rate 18, height 5\' 5"  (1.651 m), weight 52.2 kg (115 lb), SpO2 99 %.   REVIEW OF  SYSTEMS:  Review of Systems  Constitutional: Negative.  Negative for chills, fever and malaise/fatigue.  HENT: Negative.  Negative for ear discharge, ear pain, hearing loss, nosebleeds and sore throat.   Eyes: Negative.  Negative for blurred vision and pain.  Respiratory: Negative.  Negative for cough, hemoptysis, shortness of breath and wheezing.   Cardiovascular: Negative.  Negative for chest pain, palpitations and leg swelling.  Gastrointestinal: Negative.  Negative for abdominal pain, blood in stool, diarrhea, nausea and vomiting.       Abdominal cramping at times diarrhea has improved  Genitourinary: Negative.  Negative for dysuria.  Musculoskeletal: Negative.  Negative for back pain.  Skin: Negative.   Neurological: Negative for dizziness, tremors, speech change, focal weakness, seizures and headaches.  Endo/Heme/Allergies: Negative.  Does not bruise/bleed easily.  Psychiatric/Behavioral: Negative.  Negative for depression, hallucinations and suicidal ideas.     PHYSICAL EXAMINATION:  GENERAL:  21 y.o.-year-old patient lying in the bed with no acute distress.  NECK:  Supple, no jugular venous distention. No thyroid enlargement, no tenderness.  LUNGS: Normal breath sounds bilaterally, no wheezing, rales,rhonchi  No use of accessory muscles of respiration.  CARDIOVASCULAR: S1, S2 normal. No murmurs, rubs, or gallops.  ABDOMEN: Soft, non-tender, non-distended. Bowel sounds present. No organomegaly or mass.  EXTREMITIES: No pedal edema, cyanosis, or clubbing.  PSYCHIATRIC: The patient is alert and oriented x 3.  SKIN: No obvious rash, lesion, or ulcer.   DATA REVIEW:   CBC Recent Labs  Lab 03/29/17 0457  WBC 3.5*  HGB 12.4  HCT 36.1  PLT 153    Chemistries  Recent Labs  Lab 03/28/17 0805 03/29/17 0457  NA 137 140  K 3.4* 3.5  CL 107 113*  CO2 20* 22  GLUCOSE 90 87  BUN 8 6  CREATININE 0.67 0.64  CALCIUM 9.7 8.4*  MG  --  2.0  AST 20  --   ALT 16  --    ALKPHOS 51  --   BILITOT 1.3*  --     Cardiac Enzymes No results for input(s): TROPONINI in the last 168 hours.  Microbiology Results  @MICRORSLT48 @  RADIOLOGY:  No results found.    Current Discharge Medication List    START taking these medications   Details  Saccharomyces boulardii (PROBIOTIC) 250 MG CAPS Take 250 mg 2 (two) times daily by mouth. Qty: 14 capsule, Refills: 0      CONTINUE these medications which have CHANGED   Details  citalopram (CELEXA) 40 MG tablet Take 1 tablet (40 mg total) at bedtime by mouth.      CONTINUE these medications which have NOT CHANGED   Details  Cholecalciferol (VITAMIN D PO) Take 1 tablet by mouth daily.    Cyanocobalamin (B-12 PO) Take 1 tablet by mouth daily.    medroxyPROGESTERone (DEPO-PROVERA) 150 MG/ML injection INJECT 1 ML (150 MG TOTAL) INTO THE MUSCLE EVERY 3 (THREE) MONTHS. Qty: 1 mL, Refills: 4    promethazine (  PHENERGAN) 12.5 MG tablet Take 1 tablet (12.5 mg total) every 6 (six) hours as needed by mouth for nausea or vomiting. Qty: 12 tablet, Refills: 0    traZODone (DESYREL) 100 MG tablet Take 1 tablet (100 mg total) by mouth at bedtime. Qty: 30 tablet, Refills: 0    ALPRAZolam (XANAX) 0.25 MG tablet Take 0.25 mg by mouth at bedtime as needed for anxiety.    SUMAtriptan (IMITREX) 100 MG tablet Take 50-100 mg by mouth daily as needed.          Management plans discussed with the patient and she is in agreement. Stable for discharge home  Patient should follow up with pcp  CODE STATUS:     Code Status Orders  (From admission, onward)        Start     Ordered   03/28/17 1151  Full code  Continuous     03/28/17 1151    Code Status History    Date Active Date Inactive Code Status Order ID Comments User Context   07/08/2015 16:16 07/10/2015 16:35 Full Code [id number]  Clapacs, Jackquline DenmarkJohn T, MD Inpatient      TOTAL TIME TAKING CARE OF THIS PATIENT: 37 minutes.    Note: This dictation was prepared with  Dragon dictation along with smaller phrase technology. Any transcriptional errors that result from this process are unintentional.  Reiss Mowrey M.D on 03/29/2017 at 12:07 PM  Between 7am to 6pm - Pager - [phone] After 6pm go to [email or web address] - Social research officer, [email or web address] [email or web address]  [facility]  Office  [phone]  CC: Primary care physician; Gabriel CirriWicker, Cheryl, NP

## 2017-03-29 NOTE — Progress Notes (Addendum)
Received MD order to discharge patient to home reviewed home meds, follow up appointments and prescriptions with patient and patient verbalized understanding, patient refused wheelchair and wanted to walk out with boyfriend

## 2017-03-30 ENCOUNTER — Ambulatory Visit: Payer: Self-pay | Admitting: Oncology

## 2017-03-30 ENCOUNTER — Ambulatory Visit: Payer: Self-pay

## 2017-03-30 LAB — HIV ANTIBODY (ROUTINE TESTING W REFLEX): HIV Screen 4th Generation wRfx: NONREACTIVE

## 2017-04-02 ENCOUNTER — Inpatient Hospital Stay: Payer: Self-pay | Admitting: Unknown Physician Specialty

## 2017-04-16 ENCOUNTER — Ambulatory Visit: Payer: Medicaid Other | Admitting: Gastroenterology

## 2017-04-18 NOTE — Progress Notes (Deleted)
[facility]  Telephone:(336) [phone] [phone]) [phone]  ID: Jennifer Watts OB: 03/11/1996  MR#: [id number]  WUX#:[id number]#:[id number]  Patient Care Team: Gabriel CirriWicker, Cheryl, NP as PCP - General (Nurse Practitioner)  CHIEF COMPLAINT: Rituxan infusion for multiple sclerosis.  INTERVAL HISTORY: Patient returns to clinic for continuation of Rituxan. She feels symptomatically improved with Rituxan. She also reports her most recent MRI is improved. She continues to be anxious, but otherwise feels well. She has no new neurologic complaints. She denies any recent fevers. She has no chest pain or shortness of breath. She denies any weight loss. She denies any nausea, vomiting, constipation, or diarrhea. She has no urinary complaints. Patient offers no further specific complaints.  REVIEW OF SYSTEMS:   Review of Systems  Constitutional: Negative.  Negative for fever, malaise/fatigue and weight loss.  Respiratory: Negative.  Negative for cough and shortness of breath.   Cardiovascular: Negative.  Negative for chest pain and leg swelling.  Gastrointestinal: Negative.  Negative for abdominal pain.  Genitourinary: Negative.   Musculoskeletal: Negative.   Skin: Negative.  Negative for rash.  Neurological: Negative.  Negative for sensory change, focal weakness and weakness.  Psychiatric/Behavioral: The patient is nervous/anxious.     As per HPI. Otherwise, a complete review of systems is negative.  PAST MEDICAL HISTORY: Past Medical History:  Diagnosis Date  . Anxiety   . Depression   . MS (multiple sclerosis) (HCC)   . Neuromuscular disorder (HCC)     PAST SURGICAL HISTORY: Past Surgical History:  Procedure Laterality Date  . TYMPANOSTOMY TUBE PLACEMENT      FAMILY HISTORY Family History  Problem Relation Age of Onset  . Hypertension Maternal Grandmother   . Cancer Paternal Grandfather        throat  . Multiple sclerosis Paternal Grandfather   . Anxiety disorder Mother         ADVANCED DIRECTIVES:    HEALTH MAINTENANCE: Social History   Tobacco Use  . Smoking status: Current Every Day Smoker    Packs/day: 0.50  . Smokeless tobacco: Never Used  Substance Use Topics  . Alcohol use: No  . Drug use: No     Colonoscopy:  PAP:  Bone density:  Lipid panel:  Allergies  Allergen Reactions  . Reglan [Metoclopramide] Other (See Comments)    Hyperactivity /becomes mean  . Ibuprofen Rash  . Penicillins Rash    Has patient had a PCN reaction causing immediate rash, facial/tongue/throat swelling, SOB or lightheadedness with hypotension: No Has patient had a PCN reaction causing severe rash involving mucus membranes or skin necrosis: No Has patient had a PCN reaction that required hospitalization: No Has patient had a PCN reaction occurring within the last 10 years: No If all of the above answers are "NO", then may proceed with Cephalosporin use.   . Sulfa Antibiotics Rash  . Sulfasalazine Rash    Current Outpatient Medications  Medication Sig Dispense Refill  . ALPRAZolam (XANAX) 0.25 MG tablet Take 0.25 mg by mouth at bedtime as needed for anxiety.    . Cholecalciferol (VITAMIN D PO) Take 1 tablet by mouth daily.    . citalopram (CELEXA) 40 MG tablet Take 1 tablet (40 mg total) at bedtime by mouth.    . Cyanocobalamin (B-12 PO) Take 1 tablet by mouth daily.    . medroxyPROGESTERone (DEPO-PROVERA) 150 MG/ML injection INJECT 1 ML (150 MG TOTAL) INTO THE MUSCLE EVERY 3 (THREE) MONTHS. 1 mL 4  . promethazine (PHENERGAN) 12.5 MG tablet Take 1  tablet (12.5 mg total) every 6 (six) hours as needed by mouth for nausea or vomiting. 12 tablet 0  . Saccharomyces boulardii (PROBIOTIC) 250 MG CAPS Take 250 mg 2 (two) times daily by mouth. 14 capsule 0  . SUMAtriptan (IMITREX) 100 MG tablet Take 50-100 mg by mouth daily as needed.    . traZODone (DESYREL) 100 MG tablet Take 1 tablet (100 mg total) by mouth at bedtime. (Patient taking differently: Take 50 mg by  mouth at bedtime. ) 30 tablet 0   No current facility-administered medications for this visit.     OBJECTIVE: There were no vitals filed for this visit.   There is no height or weight on file to calculate BMI.    ECOG FS:0 - Asymptomatic  General: Well-developed, well-nourished, no acute distress. Eyes: Pink conjunctiva, anicteric sclera. HEENT: Normocephalic, moist mucous membranes, clear oropharnyx. Musculoskeletal: No edema, cyanosis, or clubbing. Neuro: Alert, answering all questions appropriately. Cranial nerves grossly intact. Skin: No rashes or petechiae noted. Psych: Normal affect.   LAB RESULTS:  Lab Results  Component Value Date   NA 140 03/29/2017   K 3.5 03/29/2017   CL 113 (H) 03/29/2017   CO2 22 03/29/2017   GLUCOSE 87 03/29/2017   BUN 6 03/29/2017   CREATININE 0.64 03/29/2017   CALCIUM 8.4 (L) 03/29/2017   PROT 7.0 03/28/2017   ALBUMIN 4.7 03/28/2017   AST 20 03/28/2017   ALT 16 03/28/2017   ALKPHOS 51 03/28/2017   BILITOT 1.3 (H) 03/28/2017   GFRNONAA >60 03/29/2017   GFRAA >60 03/29/2017    Lab Results  Component Value Date   WBC 3.5 (L) 03/29/2017   NEUTROABS 3.6 03/28/2017   HGB 12.4 03/29/2017   HCT 36.1 03/29/2017   MCV 89.8 03/29/2017   PLT 153 03/29/2017     STUDIES: US Abdomen Complete  Result Date: 03/27/2017 CLINICAL DATA:  Elevated bilirubin level. EXAM: ABDOMEN ULTRASOUND COMPLETE COMPARISON:  CT scan of March 01, 2017. FINDINGS: Gallbladder: No gallstones or wall thickening visualized. No sonographic Murphy sign noted by sonographer. Common bile duct: Diameter: 3 mm which is within normal limits. Liver: No focal lesion identified. Within normal limits in parenchymal echogenicity. Portal vein is patent on color Doppler imaging with normal direction of blood flow towards the liver. IVC: No abnormality visualized. Pancreas: Visualized portion unremarkable. Spleen: Size and appearance within normal limits. Right Kidney: Length: 10.3 cm.  Echogenicity within normal limits. No mass or hydronephrosis visualized. Left Kidney: Length: 10.7 cm. Echogenicity within normal limits. No mass or hydronephrosis visualized. Abdominal aorta: No aneurysm visualized. Other findings: None. IMPRESSION: No definite abnormality seen in the abdomen. Electronically Signed   By: Lupita Raider, M.D.   On: 03/27/2017 10:00    ASSESSMENT: Rituxan infusion for multiple sclerosis.  PLAN:    1. Multiple sclerosis: Proceed with cycle 1, day 1 of 1000 mg IV Rituxan. Patient now only receives infusions every 6 months. Patient expressed understanding that all laboratory work and imaging will be monitored by her primary neurologist. She also understands that she has any questions regarding her multiple sclerosis or side effects to Rituxan to direct them towards Dr. Malvin Johns.  Approximately 30 minutes was spent in discussion of which greater than 50% was consultation.  Patient expressed understanding and was in agreement with this plan. She also understands that She can call clinic at any time with any questions, concerns, or complaints.    Jeralyn Ruths, MD   04/18/2017 3:08 PM

## 2017-04-19 ENCOUNTER — Ambulatory Visit: Payer: Medicaid Other

## 2017-04-20 ENCOUNTER — Ambulatory Visit: Payer: Medicaid Other

## 2017-04-20 ENCOUNTER — Inpatient Hospital Stay: Payer: MEDICAID | Admitting: Oncology

## 2017-04-20 MED ORDER — METHYLPREDNISOLONE SODIUM SUCC 125 MG IJ SOLR: 100.0000 mg | Freq: Once | INTRAMUSCULAR | Status: DC

## 2017-04-20 MED ORDER — RITUXIMAB CHEMO INJECTION 500 MG/50ML: 1000.0000 mg | Freq: Once | INTRAVENOUS | Status: DC

## 2017-04-20 MED ORDER — SODIUM CHLORIDE 0.9 % IV SOLN
Freq: Once | INTRAVENOUS | Status: DC
  Filled 2017-04-20: qty 1000

## 2017-04-20 MED ORDER — DIPHENHYDRAMINE HCL 25 MG PO CAPS: 50.0000 mg | ORAL_CAPSULE | Freq: Once | ORAL | Status: DC

## 2017-04-20 MED ORDER — RITUXIMAB CHEMO INJECTION 500 MG/50ML
1000.0000 mg | Freq: Once | INTRAVENOUS | Status: DC
  Filled 2017-04-20: qty 100

## 2017-04-20 MED ORDER — ACETAMINOPHEN 500 MG PO TABS: 1000.0000 mg | ORAL_TABLET | Freq: Once | ORAL | Status: AC

## 2017-04-23 ENCOUNTER — Ambulatory Visit: Payer: Self-pay | Admitting: Gastroenterology

## 2017-04-26 ENCOUNTER — Ambulatory Visit: Payer: Medicaid Other | Admitting: Gastroenterology

## 2017-05-17 ENCOUNTER — Ambulatory Visit
Admission: EM | Admit: 2017-05-17 | Discharge: 2017-05-17 | Disposition: A | Discharge status: Home / Self Care | Payer: Medicaid Other | Attending: Family Medicine | Admitting: Family Medicine

## 2017-05-17 ENCOUNTER — Encounter: Payer: Self-pay | Admitting: *Deleted

## 2017-05-17 ENCOUNTER — Other Ambulatory Visit: Payer: Self-pay

## 2017-05-17 DIAGNOSIS — R109 Unspecified abdominal pain: Secondary | ICD-10-CM

## 2017-05-17 DIAGNOSIS — R112 Nausea with vomiting, unspecified: Secondary | ICD-10-CM

## 2017-05-17 MED ORDER — PROMETHAZINE HCL 25 MG/ML IJ SOLN
25.0000 mg | Freq: Once | INTRAMUSCULAR | Status: AC
  Administered 2017-05-17: 25 mg via INTRAVENOUS

## 2017-05-17 MED ORDER — SODIUM CHLORIDE 0.9 % IV BOLUS (SEPSIS)
1000.0000 mL | Freq: Once | INTRAVENOUS | Status: AC
  Administered 2017-05-17: 1000 mL via INTRAVENOUS

## 2017-05-17 MED ORDER — ONDANSETRON HCL 4 MG/2ML IJ SOLN: 8.0000 mg | Freq: Once | INTRAMUSCULAR | Status: DC

## 2017-05-17 MED ORDER — ONDANSETRON HCL 4 MG/2ML IJ SOLN
8.0000 mg | Freq: Once | INTRAMUSCULAR | Status: AC
  Administered 2017-05-17: 8 mg via INTRAVENOUS

## 2017-05-17 MED ORDER — SODIUM CHLORIDE 0.9 % IV SOLN: 8.0000 mg | Freq: Once | INTRAVENOUS | Status: DC

## 2017-05-17 MED ORDER — ONDANSETRON 4 MG PO TBDP: 4.0000 mg | ORAL_TABLET | Freq: Three times a day (TID) | ORAL | 0 refills | Status: DC | PRN

## 2017-05-17 MED ORDER — PROMETHAZINE HCL 25 MG PO TABS: 25.0000 mg | ORAL_TABLET | Freq: Four times a day (QID) | ORAL | 0 refills | Status: DC | PRN

## 2017-05-17 NOTE — Discharge Instructions (Signed)
Antiemetics as prescribed.  If you worsen, go to the ER.  Take care  Dr. Adriana Simas

## 2017-05-17 NOTE — ED Provider Notes (Signed)
[facility]  CSN: [id number] Arrival date & time: 05/17/17  0851  History   Chief Complaint Chief Complaint  Patient presents with  . Emesis  . Nausea   HPI  22 year old female with MS presents with nausea and vomiting.  Patient has had ongoing issues with abdominal pain, as well as nausea and vomiting over the past several months.  She was recently hospitalized in November for nausea, vomiting, diarrhea.  Patient reports a 2-day history of nausea and vomiting.  She states that she has vomited frequently.  She is heaving a lot.  She has been able to keep very little down.  She reports severe burning in her chest as well.  She feels very poorly.  She is not taking Protonix as prescribed previously.  I am unsure why.  She has seen GI recently.  No reported inciting factors.  Patient does smoke marijuana.  This has been the listed as a possible etiology by gastroenterology.  Patient states she took Phenergan at home without improvement.  This brought her in today.  Past Medical History:  Diagnosis Date  . Anxiety   . Depression   . MS (multiple sclerosis) (HCC)   . Neuromuscular disorder [facility])    Patient Active Problem List   Diagnosis Date Noted  . Panic disorder 07/08/2015  . Suicidal ideation 07/08/2015  . 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 Tobacco use disorder 07/08/2015  . Cannabis use disorder, moderate, dependence (HCC) 07/08/2015  . Dysmenorrhea in adolescent 06/02/2015  . Severe anxiety 10/28/2014  . Multiple sclerosis (HCC) 10/26/2014   Past Surgical History:  Procedure Laterality Date  . TYMPANOSTOMY TUBE PLACEMENT     OB History    No data available     Home Medications    Prior to Admission medications   Medication Sig Start Date End Date Taking? Authorizing Provider  ALPRAZolam Prudy Feeler) 0.25 MG tablet Take 0.25 mg by mouth at bedtime as needed for anxiety.   Yes [provider]    Cholecalciferol (VITAMIN D PO) Take 1 tablet by mouth daily.   Yes [provider]  citalopram (CELEXA) 40 MG tablet Take 1 tablet (40 mg total) at bedtime by mouth. 03/29/17  Yes Mody, Sital, MD  Cyanocobalamin (B-12 PO) Take 1 tablet by mouth daily.   Yes [provider]  medroxyPROGESTERone (DEPO-PROVERA) 150 MG/ML injection INJECT 1 ML (150 MG TOTAL) INTO THE MUSCLE EVERY 3 (THREE) MONTHS. 11/28/16  Yes Gabriel Cirri, NP  Saccharomyces boulardii (PROBIOTIC) 250 MG CAPS Take 250 mg 2 (two) times daily by mouth. 03/29/17  Yes Mody, Sital, MD  SUMAtriptan (IMITREX) 100 MG tablet Take 50-100 mg by mouth daily as needed. 01/08/17  Yes [provider]  traZODone (DESYREL) 100 MG tablet Take 1 tablet (100 mg total) by mouth at bedtime. Patient taking differently: Take 50 mg by mouth at bedtime.  07/10/15  Yes Pucilowska, Jolanta B, MD  ondansetron (ZOFRAN-ODT) 4 MG disintegrating tablet Take 1 tablet (4 mg total) by mouth every 8 (eight) hours as needed for nausea or vomiting. 05/17/17   Tommie Sams, DO  promethazine (PHENERGAN) 25 MG tablet Take 1 tablet (25 mg total) by mouth every 6 (six) hours as needed for nausea or vomiting. 05/17/17   Tommie Sams, DO    Family History Family History  Problem Relation Age of Onset  . Hypertension Maternal Grandmother   . Cancer Paternal Grandfather        throat  .  Multiple sclerosis Paternal Grandfather   . Anxiety disorder Mother     Social History Social History   Tobacco Use  . Smoking status: Current Every Day Smoker    Packs/day: 0.50  . Smokeless tobacco: Never Used  Substance Use Topics  . Alcohol use: No  . Drug use: No     Allergies   Reglan [metoclopramide]; Ibuprofen; Penicillins; Sulfa antibiotics; and Sulfasalazine   Review of Systems Review of Systems  Constitutional: Positive for appetite change.  Gastrointestinal: Positive for nausea and vomiting.       Burning in the chest.   Physical  Exam Triage Vital Signs ED Triage Vitals  Enc Vitals Group     BP 05/17/17 0918 (!) 126/92     Pulse Rate 05/17/17 0918 96     Resp 05/17/17 0918 16     Temp 05/17/17 0918 98.9 F (37.2 C)     Temp Source 05/17/17 0918 Oral     SpO2 05/17/17 0918 99 %     Weight --      Height --      Head Circumference --      Peak Flow --      Pain Score 05/17/17 0921 0     Pain Loc --      Pain Edu? --      Excl. in GC? --    Updated Vital Signs BP (!) 126/92 (BP Location: Left Arm)   Pulse 96   Temp 98.9 F (37.2 C) (Oral)   Resp 16   SpO2 99%    Physical Exam  Constitutional: She is oriented to person, place, and time.  Thin female.  Appears uncomfortable and upset due to ongoing nausea.  HENT:  Oropharynx clear.  Lips are dry but her mucous membranes are moist.  Eyes: Conjunctivae are normal. Right eye exhibits no discharge. Left eye exhibits no discharge.  Cardiovascular: Normal rate and regular rhythm.  No murmur heard. Pulmonary/Chest: Effort normal and breath sounds normal. She has no wheezes. She has no rales.  Abdominal: Soft.  Epigastric tenderness to palpation.  Neurological: She is alert and oriented to person, place, and time.  Psychiatric:  Anxious.  Nursing note and vitals reviewed.  UC Treatments / Results  Labs (all labs ordered are listed, but only abnormal results are displayed) Labs Reviewed - No data to display  EKG  EKG Interpretation None       Radiology No results found.  Procedures Procedures (including critical care time)  Medications Ordered in UC Medications  promethazine (PHENERGAN) injection 25 mg (25 mg Intravenous Given 05/17/17 0949)  sodium chloride 0.9 % bolus 1,000 mL (0 mLs Intravenous Stopped 05/17/17 1048)  ondansetron (ZOFRAN) injection 8 mg (8 mg Intravenous Given 05/17/17 1027)     Initial Impression / Assessment and Plan / UC Course  I have reviewed the triage vital signs and the nursing notes.  Pertinent labs & imaging  results that were available during my care of the patient were reviewed by me and considered in my medical decision making (see chart for details).    22 year old female presents with nausea and vomiting.  IV started and fluids given.  This was subsequently stopped as patient requested to go home.  She was given Zofran and Phenergan here without dramatic improvement.  She did tolerate oral fluids prior to leaving.  I advised her that she should go to the ER if she fails to improve or worsens.  Zofran and Phenergan as needed.  Final Clinical Impressions(s) / UC Diagnoses   Final diagnoses:  Intractable vomiting with nausea, unspecified vomiting type    ED Discharge Orders        Ordered    ondansetron (ZOFRAN-ODT) 4 MG disintegrating tablet  Every 8 hours PRN     05/17/17 1047    promethazine (PHENERGAN) 25 MG tablet  Every 6 hours PRN     05/17/17 1047     Controlled Substance Prescriptions [place] Controlled Substance Registry consulted? Not Applicable   Tommie Sams, DO 05/17/17 1103

## 2017-05-17 NOTE — ED Triage Notes (Signed)
Patient started having symptoms of nausea and vomiting 2 days ago.

## 2017-05-23 NOTE — Progress Notes (Signed)
[facility]  [phone] [phone]  ID: Jennifer Watts OB: 1995-07-31  MR#: [id number]  GEX#:[id number]  Patient Care Team: Gabriel Cirri, NP as PCP - General (Nurse Practitioner)  CHIEF COMPLAINT: Rituxan infusion for multiple sclerosis.  INTERVAL HISTORY: Patient returns to clinic for continuation of Rituxan. She feels symptomatically improved with Rituxan. She has had increased nausea this past week possibly secondary to mild GI virus, but otherwise feels well.  She denies any fevers. She continues to be anxious. She has no new neurologic complaints. She has no chest pain or shortness of breath. She denies any weight loss. She denies any vomiting, constipation, or diarrhea. She has no urinary complaints. Patient offers no further specific complaints.  REVIEW OF SYSTEMS:   Review of Systems  Constitutional: Negative.  Negative for fever, malaise/fatigue and weight loss.  Respiratory: Negative.  Negative for cough and shortness of breath.   Cardiovascular: Negative.  Negative for chest pain and leg swelling.  Gastrointestinal: Positive for nausea. Negative for abdominal pain.  Genitourinary: Negative.   Musculoskeletal: Negative.   Skin: Negative.  Negative for rash.  Neurological: Negative.  Negative for sensory change, focal weakness and weakness.  Psychiatric/Behavioral: The patient is nervous/anxious.     As per HPI. Otherwise, a complete review of systems is negative.  PAST MEDICAL HISTORY: Past Medical History:  Diagnosis Date  . Anxiety   . Depression   . MS (multiple sclerosis) (HCC)   . Neuromuscular disorder (HCC)     PAST SURGICAL HISTORY: Past Surgical History:  Procedure Laterality Date  . TYMPANOSTOMY TUBE PLACEMENT      FAMILY HISTORY Family History  Problem Relation Age of Onset  . Hypertension Maternal Grandmother   . Cancer Paternal Grandfather        throat  . Multiple sclerosis Paternal Grandfather   .  Anxiety disorder Mother        ADVANCED DIRECTIVES:    HEALTH MAINTENANCE: Social History   Tobacco Use  . Smoking status: Current Every Day Smoker    Packs/day: 0.50  . Smokeless tobacco: Never Used  Substance Use Topics  . Alcohol use: No  . Drug use: No     Colonoscopy:  PAP:  Bone density:  Lipid panel:  Allergies  Allergen Reactions  . Reglan [Metoclopramide] Other (See Comments)    Hyperactivity /becomes mean  . Ibuprofen Rash  . Penicillins Rash    Has patient had a PCN reaction causing immediate rash, facial/tongue/throat swelling, SOB or lightheadedness with hypotension: No Has patient had a PCN reaction causing severe rash involving mucus membranes or skin necrosis: No Has patient had a PCN reaction that required hospitalization: No Has patient had a PCN reaction occurring within the last 10 years: No If all of the above answers are "NO", then may proceed with Cephalosporin use.   . Sulfa Antibiotics Rash  . Sulfasalazine Rash    Current Outpatient Medications  Medication Sig Dispense Refill  . ALPRAZolam (XANAX) 0.25 MG tablet Take 0.25 mg by mouth at bedtime as needed for anxiety.    . Cholecalciferol (VITAMIN D PO) Take 1 tablet by mouth daily.    . citalopram (CELEXA) 40 MG tablet Take 1 tablet (40 mg total) at bedtime by mouth.    . Cyanocobalamin (B-12 PO) Take 1 tablet by mouth daily.    . medroxyPROGESTERone (DEPO-PROVERA) 150 MG/ML injection INJECT 1 ML (150 MG TOTAL) INTO THE MUSCLE EVERY 3 (THREE) MONTHS. 1 mL 4  . ondansetron (ZOFRAN-ODT)  4 MG disintegrating tablet Take 1 tablet (4 mg total) by mouth every 8 (eight) hours as needed for nausea or vomiting. 20 tablet 0  . promethazine (PHENERGAN) 25 MG tablet Take 1 tablet (25 mg total) by mouth every 6 (six) hours as needed for nausea or vomiting. 30 tablet 0  . SUMAtriptan (IMITREX) 100 MG tablet Take 50-100 mg by mouth daily as needed.    . traZODone (DESYREL) 100 MG tablet Take 1 tablet (100  mg total) by mouth at bedtime. (Patient taking differently: Take 50 mg by mouth at bedtime. ) 30 tablet 0   No current facility-administered medications for this visit.    Facility-Administered Medications Ordered in Other Visits  Medication Dose Route Frequency Provider Last Rate Last Dose  . 0.9 %  sodium chloride infusion   Intravenous Once Jeralyn Ruths, MD      . acetaminophen (TYLENOL) tablet 1,000 mg  1,000 mg Oral Once Jeralyn Ruths, MD      . diphenhydrAMINE (BENADRYL) capsule 50 mg  50 mg Oral Once Jeralyn Ruths, MD      . methylPREDNISolone sodium succinate (SOLU-MEDROL) 125 mg/2 mL injection 100 mg  100 mg Intravenous Once Jeralyn Ruths, MD      . riTUXimab (RITUXAN) 1,000 mg in sodium chloride 0.9 % 150 mL chemo infusion  1,000 mg Intravenous Once Jeralyn Ruths, MD      . riTUXimab (RITUXAN) 1,000 mg in sodium chloride 0.9 % 250 mL (2.8571 mg/mL) chemo infusion  1,000 mg Intravenous Once Jeralyn Ruths, MD        OBJECTIVE: Vitals:   05/25/17 0957  BP: 130/88  Pulse: 99  Resp: 18  Temp: (!) 97.2 F (36.2 C)     Body mass index is 17.99 kg/m.    ECOG FS:0 - Asymptomatic  General: Well-developed, well-nourished, no acute distress. Eyes: Pink conjunctiva, anicteric sclera. HEENT: Normocephalic, moist mucous membranes, clear oropharnyx. Musculoskeletal: No edema, cyanosis, or clubbing. Neuro: Alert, answering all questions appropriately. Cranial nerves grossly intact. Skin: No rashes or petechiae noted. Psych: Normal affect.   LAB RESULTS:  Lab Results  Component Value Date   NA 140 03/29/2017   K 3.5 03/29/2017   CL 113 (H) 03/29/2017   CO2 22 03/29/2017   GLUCOSE 87 03/29/2017   BUN 6 03/29/2017   CREATININE 0.64 03/29/2017   CALCIUM 8.4 (L) 03/29/2017   PROT 7.0 03/28/2017   ALBUMIN 4.7 03/28/2017   AST 20 03/28/2017   ALT 16 03/28/2017   ALKPHOS 51 03/28/2017   BILITOT 1.3 (H) 03/28/2017   GFRNONAA >60 03/29/2017    GFRAA >60 03/29/2017    Lab Results  Component Value Date   WBC 3.5 (L) 03/29/2017   NEUTROABS 3.6 03/28/2017   HGB 12.4 03/29/2017   HCT 36.1 03/29/2017   MCV 89.8 03/29/2017   PLT 153 03/29/2017     STUDIES: No results found.  ASSESSMENT: Rituxan infusion for multiple sclerosis.  PLAN:    1. Multiple sclerosis: Proceed 1000 mg IV Rituxan. Patient now only receives infusions every 6 months. Patient expressed understanding that all laboratory work and imaging will be monitored by her primary neurologist. She also understands that she has any questions regarding her multiple sclerosis or side effects to Rituxan to direct them towards Dr. Malvin Johns. 2.  Nausea: Patient states she is mildly improved today.  Continue symptomatic management.  She has been instructed that she begins to have fevers or symptoms become worse seek treatment  at primary care or urgent care.  Approximately 30 minutes was spent in discussion of which greater than 50% was consultation.  Patient expressed understanding and was in agreement with this plan. She also understands that She can call clinic at any time with any questions, concerns, or complaints.    Jeralyn Ruths, MD   05/25/2017 10:53 AM

## 2017-05-25 ENCOUNTER — Inpatient Hospital Stay: Payer: Medicaid Other

## 2017-05-25 ENCOUNTER — Inpatient Hospital Stay: Payer: Self-pay | Attending: Oncology | Admitting: Oncology

## 2017-05-25 VITALS — BP 130/88 | HR 99 | Temp 97.2°F | Resp 18 | Wt 108.1 lb

## 2017-05-25 VITALS — BP 117/75 | HR 85 | Temp 97.8°F | Resp 18

## 2017-05-25 DIAGNOSIS — Z5112 Encounter for antineoplastic immunotherapy: Secondary | ICD-10-CM | POA: Insufficient documentation

## 2017-05-25 DIAGNOSIS — G35 Multiple sclerosis: Secondary | ICD-10-CM | POA: Insufficient documentation

## 2017-05-25 DIAGNOSIS — R110 Nausea: Secondary | ICD-10-CM | POA: Insufficient documentation

## 2017-05-25 MED ORDER — METHYLPREDNISOLONE SODIUM SUCC 125 MG IJ SOLR
100.0000 mg | Freq: Once | INTRAMUSCULAR | Status: AC
  Administered 2017-05-25: 100 mg via INTRAVENOUS
  Filled 2017-05-25: qty 2

## 2017-05-25 MED ORDER — DIPHENHYDRAMINE HCL 25 MG PO CAPS
50.0000 mg | ORAL_CAPSULE | Freq: Once | ORAL | Status: AC
  Administered 2017-05-25: 50 mg via ORAL
  Filled 2017-05-25: qty 2

## 2017-05-25 MED ORDER — SODIUM CHLORIDE 0.9 % IV SOLN
1000.0000 mg | Freq: Once | INTRAVENOUS | Status: AC
  Administered 2017-05-25: 1000 mg via INTRAVENOUS
  Filled 2017-05-25: qty 100

## 2017-05-25 MED ORDER — SODIUM CHLORIDE 0.9 % IV SOLN
Freq: Once | INTRAVENOUS | Status: AC
  Administered 2017-05-25: 10:00:00 via INTRAVENOUS
  Filled 2017-05-25: qty 1000

## 2017-05-25 MED ORDER — ACETAMINOPHEN 500 MG PO TABS
1000.0000 mg | ORAL_TABLET | Freq: Once | ORAL | Status: AC
  Administered 2017-05-25: 1000 mg via ORAL
  Filled 2017-05-25: qty 2

## 2017-05-25 NOTE — Progress Notes (Unsigned)
Patient here for her 6 month Rituxan infusion. Had ED visit for ongoing N/V and diarrhea on 05/17/17. Patient has been having these symptoms on and off for a couple of months now. Reports not feeling well today. She took phenergan before coming to clinic today. Appetite seems good while in clinic she ate a egg and sausage biscuit, fries, peanut butter nabs and several shasta soda's. 1215 PM Patient came up to nurses station and asked to have IV stopped and taken out because her stomach is bothering her and she is ready to go. Denies any vomiting or diarrhea on her trip to [place]. States my stomach is cramping.MD notified and plan to discontinue medication.  1230 PM patient asked how much medicine to she get. I told her about 25ml as this drug is titrated in very small increments. She has decided to try and stay.

## 2017-05-29 ENCOUNTER — Emergency Department
Admission: EM | Admit: 2017-05-29 | Discharge: 2017-05-29 | Disposition: A | Discharge status: Home / Self Care | Payer: Self-pay | Attending: Emergency Medicine | Admitting: Emergency Medicine

## 2017-05-29 ENCOUNTER — Emergency Department: Payer: Self-pay

## 2017-05-29 ENCOUNTER — Encounter: Payer: Self-pay | Admitting: Medical Oncology

## 2017-05-29 DIAGNOSIS — Z79899 Other long term (current) drug therapy: Secondary | ICD-10-CM | POA: Insufficient documentation

## 2017-05-29 DIAGNOSIS — F17200 Nicotine dependence, unspecified, uncomplicated: Secondary | ICD-10-CM | POA: Insufficient documentation

## 2017-05-29 DIAGNOSIS — R112 Nausea with vomiting, unspecified: Secondary | ICD-10-CM | POA: Insufficient documentation

## 2017-05-29 DIAGNOSIS — F1210 Cannabis abuse, uncomplicated: Secondary | ICD-10-CM | POA: Insufficient documentation

## 2017-05-29 DIAGNOSIS — R1084 Generalized abdominal pain: Secondary | ICD-10-CM | POA: Insufficient documentation

## 2017-05-29 DIAGNOSIS — G35 Multiple sclerosis: Secondary | ICD-10-CM | POA: Insufficient documentation

## 2017-05-29 LAB — CBC
HCT: 45.8 % (ref 35.0–47.0)
Hemoglobin: 15.8 g/dL (ref 12.0–16.0)
MCH: 30.3 pg (ref 26.0–34.0)
MCHC: 34.6 g/dL (ref 32.0–36.0)
MCV: 87.8 fL (ref 80.0–100.0)
PLATELETS: 415 10*3/uL (ref 150–440)
RBC: 5.22 MIL/uL — AB (ref 3.80–5.20)
RDW: 13.6 % (ref 11.5–14.5)
WBC: 13.2 10*3/uL — AB (ref 3.6–11.0)

## 2017-05-29 LAB — HCG, QUANTITATIVE, PREGNANCY: hCG, Beta Chain, Quant, S: <1 m[IU]/mL (ref <5)

## 2017-05-29 LAB — COMPREHENSIVE METABOLIC PANEL
ALK PHOS: 47 U/L (ref 38–126)
ALT: 14 U/L (ref 14–54)
AST: 30 U/L (ref 15–41)
Albumin: 5.3 g/dL — ABNORMAL HIGH (ref 3.5–5.0)
Anion gap: 12 (ref 5–15)
BILIRUBIN TOTAL: 1.5 mg/dL — AB (ref 0.3–1.2)
BUN: 20 mg/dL (ref 6–20)
CALCIUM: 10.5 mg/dL — AB (ref 8.9–10.3)
CO2: 21 mmol/L — ABNORMAL LOW (ref 22–32)
CREATININE: 0.88 mg/dL (ref 0.44–1.00)
Chloride: 104 mmol/L (ref 101–111)
GFR calc Af Amer: >60 mL/min (ref >60)
Glucose, Bld: 103 mg/dL — ABNORMAL HIGH (ref 65–99)
Potassium: 3.5 mmol/L (ref 3.5–5.1)
Sodium: 137 mmol/L (ref 135–145)
Total Protein: 8.1 g/dL (ref 6.5–8.1)

## 2017-05-29 LAB — URINALYSIS, COMPLETE (UACMP) WITH MICROSCOPIC
BILIRUBIN URINE: NEGATIVE
Bacteria, UA: NONE SEEN
GLUCOSE, UA: NEGATIVE mg/dL
Hgb urine dipstick: NEGATIVE
KETONES UR: 5 mg/dL — AB
LEUKOCYTES UA: NEGATIVE
Nitrite: NEGATIVE
PH: 6 (ref 5.0–8.0)
Protein, ur: NEGATIVE mg/dL
SPECIFIC GRAVITY, URINE: 1.01 (ref 1.005–1.030)

## 2017-05-29 LAB — LIPASE, BLOOD: Lipase: 23 U/L (ref 11–51)

## 2017-05-29 MED ORDER — IOPAMIDOL (ISOVUE-300) INJECTION 61%: 75.0000 mL | Freq: Once | INTRAVENOUS | Status: DC | PRN

## 2017-05-29 MED ORDER — METHYLPREDNISOLONE SODIUM SUCC 40 MG IJ SOLR
40.0000 mg | Freq: Once | INTRAMUSCULAR | Status: AC
  Administered 2017-05-29: 40 mg via INTRAVENOUS
  Filled 2017-05-29: qty 1

## 2017-05-29 MED ORDER — DIPHENHYDRAMINE HCL 50 MG/ML IJ SOLN
50.0000 mg | Freq: Once | INTRAMUSCULAR | Status: DC
  Filled 2017-05-29: qty 1

## 2017-05-29 MED ORDER — ONDANSETRON HCL 4 MG/2ML IJ SOLN
4.0000 mg | Freq: Once | INTRAMUSCULAR | Status: AC
  Administered 2017-05-29: 4 mg via INTRAVENOUS
  Filled 2017-05-29: qty 2

## 2017-05-29 MED ORDER — ONDANSETRON 4 MG PO TBDP: 4.0000 mg | ORAL_TABLET | Freq: Three times a day (TID) | ORAL | 0 refills | Status: DC | PRN

## 2017-05-29 MED ORDER — ONDANSETRON 4 MG PO TBDP
4.0000 mg | ORAL_TABLET | Freq: Once | ORAL | Status: DC | PRN
  Filled 2017-05-29: qty 1

## 2017-05-29 MED ORDER — SODIUM CHLORIDE 0.9 % IV BOLUS (SEPSIS)
1000.0000 mL | Freq: Once | INTRAVENOUS | Status: AC
  Administered 2017-05-29: 1000 mL via INTRAVENOUS

## 2017-05-29 MED ORDER — FENTANYL CITRATE (PF) 100 MCG/2ML IJ SOLN
50.0000 ug | Freq: Once | INTRAMUSCULAR | Status: AC
  Administered 2017-05-29: 50 ug via INTRAVENOUS
  Filled 2017-05-29: qty 2

## 2017-05-29 MED ORDER — MORPHINE SULFATE (PF) 4 MG/ML IV SOLN
4.0000 mg | Freq: Once | INTRAVENOUS | Status: AC
  Administered 2017-05-29: 4 mg via INTRAVENOUS

## 2017-05-29 MED ORDER — MORPHINE SULFATE (PF) 4 MG/ML IV SOLN
INTRAVENOUS | Status: AC
  Filled 2017-05-29: qty 1

## 2017-05-29 MED ORDER — ACETAMINOPHEN 500 MG PO TABS
1000.0000 mg | ORAL_TABLET | Freq: Once | ORAL | Status: AC
  Administered 2017-05-29: 1000 mg via ORAL
  Filled 2017-05-29: qty 2

## 2017-05-29 NOTE — ED Notes (Addendum)
Pt unable to void at this time for UA and poc, MD aware

## 2017-05-29 NOTE — ED Notes (Signed)
PT verbalizes d/c teaching and follow up. PT in NAD at time of d/c, pt friend is picking up pt.

## 2017-05-29 NOTE — ED Notes (Signed)
FN: pt presents with abd pain, n/v for two weeks.

## 2017-05-29 NOTE — ED Provider Notes (Signed)
[facility] Emergency Department Provider Note  ____________________________________________  Time seen: Approximately 8:53 AM  I have reviewed the triage vital signs and the nursing notes.   HISTORY  Chief Complaint Abdominal Pain    HPI Jennifer Watts is a 22 y.o. female with a history of multiple sclerosis, daily marijuana use, anxiety and depression, presenting with 15 days of diffuse abdominal discomfort associated with nausea and vomiting. The patient reports that she has had a diffuse, nonfocal abdominal cramping that comes and goes associated with intermittent nausea and vomiting. This does not occur every day, but has been worse today. She has been having normal bowel movements. She denies any dysuria, urinary frequency, although odorous urine, and vaginal discharge. She states that she has had previous episodes of abdominal pain for which she was seen by primary care physician as well as a gastroenterologist, who suggested the most likely etiology was her daily marijuana use. She is under daily treatment for her multiple sclerosis. The patient has tried stopping marijuana 2 days ago, which did not help her symptoms. No cold symptoms.   Past Medical History:  Diagnosis Date  . Anxiety   . Depression   . MS (multiple sclerosis) (HCC)   . Neuromuscular disorder [facility])     Patient Active Problem List   Diagnosis Date Noted  . Panic disorder 07/08/2015  . Suicidal ideation 07/08/2015  . 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 Tobacco use disorder 07/08/2015  . Cannabis use disorder, moderate, dependence (HCC) 07/08/2015  . Dysmenorrhea in adolescent 06/02/2015  . Severe anxiety 10/28/2014  . Multiple sclerosis (HCC) 10/26/2014    Past Surgical History:  Procedure Laterality Date  . TYMPANOSTOMY TUBE PLACEMENT      Current Outpatient Rx  . Order #: [id number] Class: Historical Med  . Order #:  [id number] Class: Historical Med  . Order #: [id number] Class: No Print  . Order #: [id number] Class: Historical Med  . Order #: [id number] Class: Normal  . Order #: [id number] Class: Normal  . Order #: [id number] Class: Historical Med  . Order #: [id number] Class: Print  . Order #: [id number] Class: Print    Allergies Reglan [metoclopramide]; Ibuprofen; Penicillins; Sulfa antibiotics; and Sulfasalazine  Family History  Problem Relation Age of Onset  . Hypertension Maternal Grandmother   . Cancer Paternal Grandfather        throat  . Multiple sclerosis Paternal Grandfather   . Anxiety disorder Mother     Social History Social History   Tobacco Use  . Smoking status: Current Every Day Smoker    Packs/day: 0.50  . Smokeless tobacco: Never Used  Substance Use Topics  . Alcohol use: No  . Drug use: No    Review of Systems Constitutional: No fever/chills. No lightheadedness or syncope. Eyes: No visual changes. ENT: No sore throat. No congestion or rhinorrhea. Cardiovascular: Denies chest pain. Denies palpitations. Respiratory: Denies shortness of breath.  No cough. Gastrointestinal: Positive diffuse nonfocal abdominal pain.  Positive nausea, positive vomiting.  No diarrhea.  No constipation. Genitourinary: Negative for dysuria. Negative for urinary frequency. Negative for change in vaginal discharge. Musculoskeletal: Negative for back pain. Skin: Negative for rash. Neurological: Negative for headaches. No focal numbness, tingling or weakness.     ____________________________________________   PHYSICAL EXAM:  VITAL SIGNS: ED Triage Vitals  Enc Vitals Group     BP 05/29/17 0756 (!) 142/84     Pulse Rate 05/29/17 0756 100     Resp 05/29/17 0756 20  Temp 05/29/17 0756 98.9 F (37.2 C)     Temp Source 05/29/17 0756 Oral     SpO2 05/29/17 0756 98 %     Weight 05/29/17 0757 110 lb (49.9 kg)     Height 05/29/17 0757 5\' 5"  (1.651 m)     Head Circumference --      Peak Flow --       Pain Score 05/29/17 0756 8     Pain Loc --      Pain Edu? --      Excl. in GC? --     Constitutional: Alert and oriented. Tearful and mildly uncomfortable appearing but in no acute distress. Answers questions appropriately. Eyes: Conjunctivae are normal.  EOMI. No scleral icterus. Head: Atraumatic. Nose: No congestion/rhinnorhea. Mouth/Throat: Mucous membranes are dry.  Neck: No stridor.  Supple.  No meningismus. Cardiovascular: Normal rate, regular rhythm. No murmurs, rubs or gallops.  Respiratory: Normal respiratory effort.  No accessory muscle use or retractions. Lungs CTAB.  No wheezes, rales or ronchi. Gastrointestinal: Soft, and nondistended. Diffuse tenderness to palpation without focality per No guarding or rebound.  No peritoneal signs. Genitourinary: Deferred as the patient is not having any symptoms Musculoskeletal: No LE edema.  Neurologic:  A&Ox3.  Speech is clear.  Face and smile are symmetric.  EOMI.  Moves all extremities well. Skin:  Skin is warm, dry and intact. No rash noted. Psychiatric: Mood and affect are normal. Speech and behavior are normal.  Normal judgement.  ____________________________________________   LABS (all labs ordered are listed, but only abnormal results are displayed)  Labs Reviewed  COMPREHENSIVE METABOLIC PANEL - Abnormal; Notable for the following components:      Result Value   CO2 21 (*)    Glucose, Bld 103 (*)    Calcium 10.5 (*)    Albumin 5.3 (*)    Total Bilirubin 1.5 (*)    All other components within normal limits  CBC - Abnormal; Notable for the following components:   WBC 13.2 (*)    RBC 5.22 (*)    All other components within normal limits  URINALYSIS, COMPLETE (UACMP) WITH MICROSCOPIC - Abnormal; Notable for the following components:   Color, Urine YELLOW (*)    APPearance CLEAR (*)    Ketones, ur 5 (*)    Squamous Epithelial / LPF 0-5 (*)    All other components within normal limits  LIPASE, BLOOD  HCG,  QUANTITATIVE, PREGNANCY  POC URINE PREG, ED   ____________________________________________  EKG  Not indicated  ____________________________________________  RADIOLOGY  Ct Renal Stone Study  Result Date: 05/29/2017 CLINICAL DATA:  Nausea and vomiting for 2 weeks. Multiple sclerosis. Flank pain EXAM: CT ABDOMEN AND PELVIS WITHOUT CONTRAST TECHNIQUE: Multidetector CT imaging of the abdomen and pelvis was performed following the standard protocol without IV contrast. COMPARISON:  Multiple exams, including 03/01/2017 and ultrasound of 03/27/2017 FINDINGS: Lower chest: Unremarkable Hepatobiliary: Unremarkable Pancreas: Unremarkable Spleen: Unremarkable Adrenals/Urinary Tract: Suspected 1 mm left kidney lower pole nonobstructive renal calculus on image 76/5. No hydronephrosis. Adrenal glands normal. Stomach/Bowel: Mild prominence of stool throughout the colon compatible with constipation. Appendix normal. Short borderline dilated segment of the terminal ileum is probably incidental. Vascular/Lymphatic: Unremarkable Reproductive: Unremarkable Other: No supplemental non-categorized findings. Musculoskeletal: Unremarkable IMPRESSION: 1. Mildly prominent stool throughout the colon favors constipation. 2. Suspected 1 mm left kidney lower pole nonobstructive renal calculus. No hydronephrosis. Electronically Signed   By: Gaylyn Rong M.D.   On: 05/29/2017 13:10    ____________________________________________  PROCEDURES  Procedure(s) performed: None  Procedures  Critical Care performed: No ____________________________________________   INITIAL IMPRESSION / ASSESSMENT AND PLAN / ED COURSE  Pertinent labs & imaging results that were available during my care of the patient were reviewed by me and considered in my medical decision making (see chart for details).  22 y.o. female presenting with 15 days of nausea and vomiting, diffuse nonfocal abdominal pain. Overall, the patient is  uncomfortable appearing but afebrile. She has no focality on her abdominal examination but given the length of symptoms that she has had, we'll plan to do a CT scan for further evaluation. It is possible that her symptoms are due to cyclical vomiting due to cannabis use. We will also rule out appendicitis, colitis. Irritable bowel syndrome is also possible Irritable bowel disease is also on the differential for female of this age.  The patient has not taken a urine but we'll also evaluate for pregnancy. We will initiate symptomatic treatment and reevaluate the patient for final disposition.  ----------------------------------------- 12:33 PM on 05/29/2017 -----------------------------------------  I have reevaluated the patient, his pain has significantly improved and her nausea has completely resolved at this time.  Her electrolytes are reassuring.  She does have an elevation of her white blood cell count initially I was planning to do a contrasted scan, but she has had a contrast dye allergy in the past.  We have initiated the prep, but on my repeat examination, the patient's abdomen continues to be soft and nonfocal.  We will do a noncontrasted scan, and reevaluate the patient for final disposition.  ----------------------------------------- 1:23 PM on 05/29/2017 -----------------------------------------  The patient's workup in the emergency department has been reassuring.  She does have a minimally elevated white blood cell count, but does not have any evidence of UTI or acute intra-abdominal infection.  Her electrolytes are also within normal limits.  Her lipase is normal.  And she continues to be hemodynamically stable.  Here, we have successfully treated her pain and her nausea and she is able to keep down clear liquids.  At this time, the patient will be discharged home, with PCP and GI follow-up.  I have encouraged her to stop smoking marijuana.  She understands return precautions as well as  follow-up instructions. ____________________________________________  FINAL CLINICAL IMPRESSION(S) / ED DIAGNOSES  Final diagnoses:  Non-intractable vomiting with nausea, unspecified vomiting type  Generalized abdominal pain  Cannabis abuse         NEW MEDICATIONS STARTED DURING THIS VISIT:  New Prescriptions   ONDANSETRON (ZOFRAN ODT) 4 MG DISINTEGRATING TABLET    Take 1 tablet (4 mg total) by mouth every 8 (eight) hours as needed for nausea or vomiting.      Rockne Menghini, MD 05/29/17 1324

## 2017-05-29 NOTE — ED Triage Notes (Signed)
Pt reports nausea and vomiting with generalized abd pain that began 05/15/17.

## 2017-05-29 NOTE — ED Notes (Signed)
ED Provider at bedside. 

## 2017-05-29 NOTE — Discharge Instructions (Signed)
STOP using marijuana.  You may take Zofran for nausea or vomiting.  Return to the emergency department for severe pain, inability to keep down fluids, lightheadedness or fainting, fever, or any other symptoms concerning to you

## 2017-06-04 ENCOUNTER — Ambulatory Visit: Payer: Self-pay | Admitting: Gastroenterology

## 2017-06-04 ENCOUNTER — Encounter: Payer: Self-pay | Admitting: Gastroenterology

## 2017-06-04 ENCOUNTER — Telehealth: Payer: Self-pay

## 2017-06-04 VITALS — BP 150/109 | HR 76 | Temp 98.0°F | Ht 65.0 in | Wt 102.2 lb

## 2017-06-04 DIAGNOSIS — K625 Hemorrhage of anus and rectum: Secondary | ICD-10-CM

## 2017-06-04 DIAGNOSIS — R140 Abdominal distension (gaseous): Secondary | ICD-10-CM

## 2017-06-04 MED ORDER — RANITIDINE HCL 150 MG PO TABS: 150.0000 mg | ORAL_TABLET | Freq: Two times a day (BID) | ORAL | 1 refills | Status: DC

## 2017-06-04 NOTE — Telephone Encounter (Signed)
LVM for patient callback for information about gastric emptying study.  Scheduled at: [facility] on Wed. 2/6 @ 830am. Nothing to eat or drink for 6 hours before. No Stomach medications. Test takes 4 hours.

## 2017-06-04 NOTE — Addendum Note (Signed)
Addended by: Ethlyn Gallery Z on: 06/04/2017 04:00 PM   Modules accepted: Orders, SmartSet

## 2017-06-04 NOTE — Progress Notes (Signed)
[name] MD, MRCP(U.K) [address]  [address]  [place], [place] [address]  Main: [phone]  Fax: [phone]   Gastroenterology Consultation  Referring Provider:     Gabriel Cirri, NP Primary Care Physician:  Gabriel Cirri, NP Primary Gastroenterologist:  Dr. [name]  Reason for Consultation:     Abdominal pain         HPI:   Jennifer Watts is a 22 y.o. y/o female referred for consultation & management  by Dr. Gabriel Cirri, NP.  She has presented to the emergency room on a few occasions with abdominal pain and vomiting she presented initially on 05/17/2017 with nausea and vomiting which has been ongoing for several months.  She does have a history of smoking marijuana.  She was treated conservatively in the ER with Zofran and Phenergan and discharged.  She subsequently presented on 05/29/2017 with similar complaints including abdominal pain which was attributed to her marijuana use.  She was treated conservatively and asked to follow-up with GI.  She underwent a CT scan of the abdomen in October 2018 which showed moderate amount of free fluid in the pelvis and multiple bilateral ovarian follicles and the free fluid was felt to be likely from ruptured ovarian cyst.  She subsequently had an ultrasound of her pelvis which was transvaginal and normal appearance of the ovaries were noted.  She had an ultrasound of her abdomen in November 2018 which was normal.  She had a CT scan renal stone protocol on 05/29/2017 which showed prominent stool throughout the colon favoring constipation 1 mm left renal nonobstructive calculus.   She has seen previously been seen by GI at [facility] clinic and her last visit with them was in November 2018 when she was seen by the nurse practitioner she does carry a history of B12 deficiency multiple sclerosis.  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  Labs January 2019 hemoglobin of 15.8 CMP  creatinine of 0.88 total albumin of 1.5 but normal transaminases.  Normal lipase HIV is nonreactive urine drug screen in 04/03/2017 was positive for opioids and cannabinoids prior evaluation of her elevated bilirubin demonstrated predominantly in direct bilirubinemia.   Abdominal pain: Onset: Began around new years, occurs every day , lasts all day  Site :lower abdomen , on and off , each episode lasts hours  Radiation: no  Severity :9/10  Nature of pain: squeezing and burning  Aggravating factors: eating - almost immediately  Relieving factors :bowel movements  Weight loss: lost - 10 lbs  NSAID use: none  PPI use :none  Gall bladder surgery: none  Frequency of bowel movements: once a day - consistency was bloody , soft most daysfeels she has good bowel movements  Change in bowel movements: twice - thinks it was from straining  Relief with bowel movements: yes , no family history of colon cancer or polyps , not on any opiods.  Gas/Bloating/Abdominal distension: yes  Symptoms worse when her abdominal pain or distension are worse.    Smokes e cigarette, stopped marijuana a week back . Long showers do help the pain .    Past Medical History:  Diagnosis Date  . Anxiety   . Depression   . MS (multiple sclerosis) (HCC)   . Neuromuscular disorder [facility])     Past Surgical History:  Procedure Laterality Date  . TYMPANOSTOMY TUBE PLACEMENT      Prior to Admission medications   Medication Sig Start Date End Date  Taking? Authorizing Provider  ALPRAZolam Prudy Feeler) 0.25 MG tablet Take 0.25 mg by mouth at bedtime as needed for anxiety.    [provider]  Cholecalciferol (VITAMIN D PO) Take 1 tablet by mouth daily.    [provider]  citalopram (CELEXA) 40 MG tablet Take 1 tablet (40 mg total) at bedtime by mouth. 03/29/17   Adrian Saran, MD  Cyanocobalamin (B-12 PO) Take 1 tablet by mouth daily.    [provider]  medroxyPROGESTERone (DEPO-PROVERA) 150 MG/ML  injection INJECT 1 ML (150 MG TOTAL) INTO THE MUSCLE EVERY 3 (THREE) MONTHS. 11/28/16   Gabriel Cirri, NP  ondansetron (ZOFRAN ODT) 4 MG disintegrating tablet Take 1 tablet (4 mg total) by mouth every 8 (eight) hours as needed for nausea or vomiting. 05/29/17   Rockne Menghini, MD  promethazine (PHENERGAN) 25 MG tablet Take 1 tablet (25 mg total) by mouth every 6 (six) hours as needed for nausea or vomiting. 05/17/17   Tommie Sams, DO  SUMAtriptan (IMITREX) 100 MG tablet Take 50-100 mg by mouth daily as needed. 01/08/17   [provider]  traZODone (DESYREL) 100 MG tablet Take 1 tablet (100 mg total) by mouth at bedtime. 07/10/15   Shari Prows, MD    Family History  Problem Relation Age of Onset  . Hypertension Maternal Grandmother   . Cancer Paternal Grandfather        throat  . Multiple sclerosis Paternal Grandfather   . Anxiety disorder Mother      Social History   Tobacco Use  . Smoking status: Current Every Day Smoker    Packs/day: 0.50  . Smokeless tobacco: Never Used  Substance Use Topics  . Alcohol use: No  . Drug use: No    Allergies as of 06/04/2017 - Review Complete 05/29/2017  Allergen Reaction Noted  . Reglan [metoclopramide] Other (See Comments) 03/28/2017  . Ibuprofen Rash 09/23/2014  . Penicillins Rash 09/23/2014  . Sulfa antibiotics Rash 09/23/2014  . Sulfasalazine Rash 09/23/2014    Review of Systems:    All systems reviewed and negative except where noted in HPI.   Physical Exam:  There were no vitals taken for this visit. No LMP recorded. Patient has had an injection. Psych:  Alert and cooperative. Normal mood and affect. General:   Alert,  Well-developed, well-nourished, pleasant and cooperative in NAD Head:  Normocephalic and atraumatic. Eyes:  Sclera clear, no icterus.   Conjunctiva pink. Ears:  Normal auditory acuity. Nose:  No deformity, discharge, or lesions. Mouth:  No deformity or lesions,oropharynx pink & moist. Neck:   Supple; no masses or thyromegaly. Lungs:  Respirations even and unlabored.  Clear throughout to auscultation.   No wheezes, crackles, or rhonchi. No acute distress. Heart:  Regular rate and rhythm; no murmurs, clicks, rubs, or gallops. Abdomen:  Normal bowel sounds.  No bruits.  Soft, non-tender and non-distended without masses, hepatosplenomegaly or hernias noted.  No guarding or rebound tenderness.    Msk:  Symmetrical without gross deformities. Good, equal movement & strength bilaterally. Pulses:  Normal pulses noted. Extremities:  No clubbing or edema.  No cyanosis. Neurologic:  Alert and oriented x3;  grossly normal neurologically. Skin:  Intact without significant lesions or rashes. No jaundice. Lymph Nodes:  No significant cervical adenopathy. Psych:  Alert and cooperative. Normal mood and affect.  Imaging Studies: Ct Renal Stone Study  Result Date: 05/29/2017 CLINICAL DATA:  Nausea and vomiting for 2 weeks. Multiple sclerosis. Flank pain EXAM: CT ABDOMEN AND PELVIS  WITHOUT CONTRAST TECHNIQUE: Multidetector CT imaging of the abdomen and pelvis was performed following the standard protocol without IV contrast. COMPARISON:  Multiple exams, including 03/01/2017 and ultrasound of 03/27/2017 FINDINGS: Lower chest: Unremarkable Hepatobiliary: Unremarkable Pancreas: Unremarkable Spleen: Unremarkable Adrenals/Urinary Tract: Suspected 1 mm left kidney lower pole nonobstructive renal calculus on image 76/5. No hydronephrosis. Adrenal glands normal. Stomach/Bowel: Mild prominence of stool throughout the colon compatible with constipation. Appendix normal. Short borderline dilated segment of the terminal ileum is probably incidental. Vascular/Lymphatic: Unremarkable Reproductive: Unremarkable Other: No supplemental non-categorized findings. Musculoskeletal: Unremarkable IMPRESSION: 1. Mildly prominent stool throughout the colon favors constipation. 2. Suspected 1 mm left kidney lower pole nonobstructive  renal calculus. No hydronephrosis. Electronically Signed   By: Gaylyn Rong M.D.   On: 05/29/2017 13:10    Assessment and Plan:   Jennifer Watts is a 22 y.o. y/o female has been referred for abdominal pain , nausea and vomiting . She has features suggestive of cannaboid hyperemesis syndrome , she has quit THC use. She could also have gas and bloating either from carbohydrate maldigestion or from SIBO. She also seems to suffer from constipation which can be from diet or from her MS. She has had episodes of rectal bleeding that needs evaluation    Plan  1. Trial of Linzess samples provided for 2 weeks  2. Stop marijuana use and e cigarettes 3. Stool H pylori antigen  4. Trial of Zantac 5. Stop all diet sodas and artificial sugars 6. EGD+colonoscopy  7. Gastric emptying study 8. If above does not help will give her a course of xifaxan for SIBO  I have discussed alternative options, risks & benefits,  which include, but are not limited to, bleeding, infection, perforation,respiratory complication & drug reaction.  The patient agrees with this plan & written consent will be obtained.    Follow up in 8 weeks   Dr [name] MD,MRCP(U.K)

## 2017-06-08 LAB — H. PYLORI ANTIGEN, STOOL: H pylori Ag, Stl: NEGATIVE

## 2017-06-11 ENCOUNTER — Telehealth: Payer: Self-pay

## 2017-06-11 NOTE — Telephone Encounter (Signed)
Advised results per Dr. Tobi Bastos.    - H pylori stool antigen negative   Patient is feeling better since taking Rx and stopping soda's.  Mother wants to keep the gastric emptying study and cancel the colonoscopy due to insurance.

## 2017-06-12 ENCOUNTER — Encounter: Admission: RE | Payer: Self-pay | Source: Ambulatory Visit

## 2017-06-12 ENCOUNTER — Ambulatory Visit: Admission: RE | Admit: 2017-06-12 | Payer: Medicaid Other | Source: Ambulatory Visit | Admitting: Gastroenterology

## 2017-06-12 SURGERY — ESOPHAGOGASTRODUODENOSCOPY (EGD) WITH PROPOFOL
Anesthesia: General

## 2017-06-19 ENCOUNTER — Other Ambulatory Visit: Payer: Self-pay

## 2017-06-19 ENCOUNTER — Telehealth: Payer: Self-pay | Admitting: Gastroenterology

## 2017-06-19 DIAGNOSIS — R1115 Cyclical vomiting syndrome unrelated to migraine: Secondary | ICD-10-CM

## 2017-06-19 MED ORDER — PROMETHAZINE HCL 12.5 MG RE SUPP: 12.5000 mg | Freq: Four times a day (QID) | RECTAL | 0 refills | Status: DC | PRN

## 2017-06-19 NOTE — Telephone Encounter (Signed)
Contacted mother concerning patient's nausea and vomiting.   Mother states patient has a stomach bug and has been unable to see PCP.   Patient currently unable to keep food or nausea medication down.   Called in nausea suppository. Advised mother no additional meds would be prescribed or refilled until patient received treatment or studies for concerns.

## 2017-06-19 NOTE — Telephone Encounter (Signed)
Patients mother called and wants to know if you will call her in some suppositories. She can't keep anything down. Please call her back asap.

## 2017-06-20 ENCOUNTER — Encounter: Payer: Self-pay | Admitting: Emergency Medicine

## 2017-06-20 ENCOUNTER — Emergency Department: Payer: Self-pay

## 2017-06-20 ENCOUNTER — Emergency Department
Admission: EM | Admit: 2017-06-20 | Discharge: 2017-06-20 | Disposition: A | Discharge status: Home / Self Care | Payer: Self-pay | Attending: Emergency Medicine | Admitting: Emergency Medicine

## 2017-06-20 ENCOUNTER — Encounter: Admission: RE | Admit: 2017-06-20 | Payer: Medicaid Other | Source: Ambulatory Visit

## 2017-06-20 DIAGNOSIS — G35 Multiple sclerosis: Secondary | ICD-10-CM | POA: Insufficient documentation

## 2017-06-20 DIAGNOSIS — R1115 Cyclical vomiting syndrome unrelated to migraine: Secondary | ICD-10-CM

## 2017-06-20 DIAGNOSIS — E876 Hypokalemia: Secondary | ICD-10-CM | POA: Insufficient documentation

## 2017-06-20 DIAGNOSIS — Z87891 Personal history of nicotine dependence: Secondary | ICD-10-CM | POA: Insufficient documentation

## 2017-06-20 DIAGNOSIS — G43A0 Cyclical vomiting, not intractable: Secondary | ICD-10-CM | POA: Insufficient documentation

## 2017-06-20 LAB — TROPONIN I

## 2017-06-20 LAB — BASIC METABOLIC PANEL
ANION GAP: 10 (ref 5–15)
Anion gap: 15 (ref 5–15)
BUN: 13 mg/dL (ref 6–20)
BUN: 11 mg/dL (ref 6–20)
CALCIUM: 10.3 mg/dL (ref 8.9–10.3)
CO2: 21 mmol/L — AB (ref 22–32)
CO2: 22 mmol/L (ref 22–32)
CREATININE: 0.84 mg/dL (ref 0.44–1.00)
CREATININE: 0.65 mg/dL (ref 0.44–1.00)
Calcium: 8.6 mg/dL — ABNORMAL LOW (ref 8.9–10.3)
Chloride: 99 mmol/L — ABNORMAL LOW (ref 101–111)
Chloride: 106 mmol/L (ref 101–111)
GFR calc Af Amer: >60 mL/min (ref >60)
GFR calc Af Amer: >60 mL/min (ref >60)
GFR calc non Af Amer: >60 mL/min (ref >60)
GLUCOSE: 93 mg/dL (ref 65–99)
Glucose, Bld: 109 mg/dL — ABNORMAL HIGH (ref 65–99)
Potassium: 2.4 mmol/L — CL (ref 3.5–5.1)
Potassium: 3.2 mmol/L — ABNORMAL LOW (ref 3.5–5.1)
SODIUM: 135 mmol/L (ref 135–145)
Sodium: 138 mmol/L (ref 135–145)

## 2017-06-20 LAB — CBC
HCT: 48.8 % — ABNORMAL HIGH (ref 35.0–47.0)
Hemoglobin: 17.2 g/dL — ABNORMAL HIGH (ref 12.0–16.0)
MCH: 30.5 pg (ref 26.0–34.0)
MCHC: 35.3 g/dL (ref 32.0–36.0)
MCV: 86.4 fL (ref 80.0–100.0)
PLATELETS: 450 10*3/uL — AB (ref 150–440)
RBC: 5.64 MIL/uL — AB (ref 3.80–5.20)
RDW: 13 % (ref 11.5–14.5)
WBC: 13.4 10*3/uL — ABNORMAL HIGH (ref 3.6–11.0)

## 2017-06-20 MED ORDER — MORPHINE SULFATE (PF) 2 MG/ML IV SOLN
2.0000 mg | Freq: Once | INTRAVENOUS | Status: AC
  Administered 2017-06-20: 2 mg via INTRAVENOUS
  Filled 2017-06-20: qty 1

## 2017-06-20 MED ORDER — POTASSIUM CHLORIDE ER 10 MEQ PO TBCR: 10.0000 meq | EXTENDED_RELEASE_TABLET | Freq: Every day | ORAL | 0 refills | Status: DC

## 2017-06-20 MED ORDER — MORPHINE SULFATE (PF) 4 MG/ML IV SOLN
4.0000 mg | Freq: Once | INTRAVENOUS | Status: AC
  Administered 2017-06-20: 4 mg via INTRAVENOUS
  Filled 2017-06-20: qty 1

## 2017-06-20 MED ORDER — POTASSIUM CHLORIDE 20 MEQ/15ML (10%) PO SOLN
40.0000 meq | Freq: Once | ORAL | Status: AC
  Administered 2017-06-20: 40 meq via ORAL
  Filled 2017-06-20: qty 30

## 2017-06-20 MED ORDER — HALOPERIDOL LACTATE 5 MG/ML IJ SOLN
2.0000 mg | Freq: Once | INTRAMUSCULAR | Status: AC
  Administered 2017-06-20: 2 mg via INTRAVENOUS
  Filled 2017-06-20: qty 1

## 2017-06-20 MED ORDER — POTASSIUM CHLORIDE IN NACL 20-0.9 MEQ/L-% IV SOLN
Freq: Once | INTRAVENOUS | Status: AC
  Administered 2017-06-20: 13:00:00 via INTRAVENOUS
  Filled 2017-06-20: qty 1000

## 2017-06-20 MED ORDER — PROMETHAZINE HCL 25 MG/ML IJ SOLN
25.0000 mg | Freq: Once | INTRAMUSCULAR | Status: AC
  Administered 2017-06-20: 25 mg via INTRAVENOUS
  Filled 2017-06-20: qty 1

## 2017-06-20 MED ORDER — LORAZEPAM 2 MG/ML IJ SOLN
0.5000 mg | Freq: Once | INTRAMUSCULAR | Status: AC
  Administered 2017-06-20: 0.5 mg via INTRAVENOUS
  Filled 2017-06-20: qty 1

## 2017-06-20 MED ORDER — HALOPERIDOL 1 MG PO TABS: 1.0000 mg | ORAL_TABLET | Freq: Three times a day (TID) | ORAL | 2 refills | Status: DC | PRN

## 2017-06-20 NOTE — ED Provider Notes (Signed)
[facility] Emergency Department Provider Note       Time seen: ----------------------------------------- 1:02 PM on 06/20/2017 -----------------------------------------   I have reviewed the triage vital signs and the nursing notes.  HISTORY   Chief Complaint Chest Pain    HPI Jennifer Watts is a 22 y.o. female with a history of anxiety, depression, MS and neuromuscular disorder who presents to the ED for chest pain and tingling in her extremities.  Patient has a history of MS has been having nausea and vomiting and now feels bad which is caused her chest pain.  Patient presents very anxious and has been hyperventilating.  She does have a history of severe anxiety and panic attacks.  She reports she is been unable to keep anything down and she feels like she needs to stay in the hospital.  Past Medical History:  Diagnosis Date  . Anxiety   . Depression   . MS (multiple sclerosis) (HCC)   . Neuromuscular disorder [facility])     Patient Active Problem List   Diagnosis Date Noted  . Mood disorder (HCC) 03/14/2017  . Migraine without aura and without status migrainosus, not intractable 03/14/2017  . Panic disorder 07/08/2015  . Suicidal ideation 07/08/2015  . 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 Tobacco use disorder 07/08/2015  . Cannabis use disorder, moderate, dependence (HCC) 07/08/2015  . Dysmenorrhea in adolescent 06/02/2015  . Severe anxiety 10/28/2014  . Multiple sclerosis (HCC) 10/26/2014    Past Surgical History:  Procedure Laterality Date  . TYMPANOSTOMY TUBE PLACEMENT      Allergies Reglan [metoclopramide]; Ibuprofen; Penicillins; Sulfa antibiotics; and Sulfasalazine  Social History Social History   Tobacco Use  . Smoking status: Former Smoker    Packs/day: 0.50    Types: E-cigarettes  . Smokeless tobacco: Never Used  Substance Use Topics  . Alcohol use: No  . Drug use: No     Review of Systems Constitutional: Negative for fever. Cardiovascular: Positive for chest pain Respiratory: Negative for shortness of breath. Gastrointestinal: Negative for abdominal pain, positive for vomiting Genitourinary: Negative for dysuria. Musculoskeletal: Negative for back pain. Skin: Negative for rash. Neurological: Negative for headaches, focal weakness or numbness.  All systems negative/normal/unremarkable except as stated in the HPI  ____________________________________________   PHYSICAL EXAM:  VITAL SIGNS: ED Triage Vitals [06/20/17 1149]  Enc Vitals Group     BP (!) 149/108     Pulse Rate (!) 150     Resp (!) 26     Temp 98 F (36.7 C)     Temp Source Oral     SpO2 99 %     Weight 102 lb (46.3 kg)     Height 5\' 5"  (1.651 m)     Head Circumference      Peak Flow      Pain Score 9     Pain Loc      Pain Edu?      Excl. in GC?    Constitutional: Alert and oriented. Well appearing and in no distress. Eyes: Conjunctivae are normal. Normal extraocular movements. ENT   Head: Normocephalic and atraumatic.   Nose: No congestion/rhinnorhea.   Mouth/Throat: Mucous membranes are moist.   Neck: No stridor. Cardiovascular: Normal rate, regular rhythm. No murmurs, rubs, or gallops. Respiratory: Normal respiratory effort without tachypnea nor retractions. Breath sounds are clear and equal bilaterally. No wheezes/rales/rhonchi. Gastrointestinal: Soft and nontender. Normal bowel sounds Musculoskeletal: Nontender with normal range of motion in extremities.  No lower extremity tenderness nor edema. Neurologic:  Normal speech and language. No gross focal neurologic deficits are appreciated.  Skin:  Skin is warm, dry and intact. No rash noted. Psychiatric: Mood and affect are normal. Speech and behavior are normal.  ____________________________________________  EKG: Interpreted by me.  Sinus tachycardia with a rate of 124 bpm, normal PR interval, normal  QRS, long QT.  There are ST and T wave abnormalities  ____________________________________________  ED COURSE:  As part of my medical decision making, I reviewed the following data within the electronic MEDICAL RECORD NUMBER History obtained from family if available, nursing notes, old chart and ekg, as well as notes from prior ED visits. Patient presented for chest pain as well as vomiting, we will assess with labs and imaging as indicated at this time.   Procedures ____________________________________________   LABS (pertinent positives/negatives)  Labs Reviewed  BASIC METABOLIC PANEL - Abnormal; Notable for the following components:      Result Value   Potassium 2.4 (*)    Chloride 99 (*)    CO2 21 (*)    Glucose, Bld 109 (*)    All other components within normal limits  CBC - Abnormal; Notable for the following components:   WBC 13.4 (*)    RBC 5.64 (*)    Hemoglobin 17.2 (*)    HCT 48.8 (*)    Platelets 450 (*)    All other components within normal limits  TROPONIN I  POC URINE PREG, ED    RADIOLOGY  Chest x-ray Is unremarkable ____________________________________________  DIFFERENTIAL DIAGNOSIS   Panic attack, dehydration, electrolyte abnormality, arrhythmia, PE, pneumothorax, gastritis, gastroenteritis  FINAL ASSESSMENT AND PLAN  Cyclic vomiting syndrome, hypokalemia   Plan: Patient had presented for chest pain and vomiting. Patient's labs did reveal hypokalemia and mild leukocytosis. Patient's imaging was negative for any acute process.  We did give her IV fluids with potassium and she was also given some oral potassium.  She did require several doses of antiemetics but currently is feeling well enough to go home.  I will prescribe Haldol for her to take in the future for this because oral antiemetics did not seem to help.  She is stable for outpatient follow-up with GI.   Ulice Dash, MD   Note: This note was generated in part or whole with voice  recognition software. Voice recognition is usually quite accurate but there are transcription errors that can and very often do occur. I apologize for any typographical errors that were not detected and corrected.     Emily Filbert, MD 06/20/17 [phone]

## 2017-06-20 NOTE — ED Notes (Signed)
Patient transported to X-ray 

## 2017-06-20 NOTE — ED Notes (Signed)
Pt states she was unable to drink Potassium, unsure as to how much of dose she received.  EDP notified.

## 2017-06-20 NOTE — ED Notes (Signed)
Urine POC negative. 

## 2017-06-20 NOTE — ED Triage Notes (Signed)
Pt comes into the ED via POV c/o chest pain and tingling in her extremities.  Patient has MS and has been having N/V and now feels bad which has caused her chest pain.  Patient presents very anxious and hyperventilating in the room.  Patient has severe h/o anxiety and panic attacks.

## 2017-06-20 NOTE — Discharge Instructions (Addendum)
please take your medicines as directed. Return as needed.

## 2017-07-13 ENCOUNTER — Encounter: Payer: Self-pay | Admitting: Pharmacy Technician

## 2017-07-13 NOTE — Progress Notes (Unsigned)
Patient has been approved for drug assistance by [place] for Rituxan. The enrollment period is from 07/13/2017-07/14/2018 based on no insurance. First DOS covered is 09/29/2016. (Company will retro 365 days from approval date.)

## 2017-11-18 NOTE — Progress Notes (Signed)
[facility]  [phone] [phone]  ID: Jennifer Watts OB: [birth year]  MR#: [id number]  UXL#:[id number]  Patient Care Team: Gabriel Cirri, NP as PCP - General (Nurse Practitioner)  CHIEF COMPLAINT: Rituxan infusion for multiple sclerosis.  INTERVAL HISTORY: Patient returns to clinic today for routine six-month evaluation and continuation of Rituxan.  Patient states he feels symptomatically improved since initiating Rituxan, but does note the effects wear off as she is leading up to her next infusion.  She currently feels well and is asymptomatic.  She denies any recent fevers or illnesses. She continues to be anxious. She has no new neurologic complaints. She has no chest pain or shortness of breath. She denies any weight loss. She denies any vomiting, constipation, or diarrhea. She has no urinary complaints.  Patient offers no specific complaints today.  REVIEW OF SYSTEMS:   Review of Systems  Constitutional: Negative.  Negative for fever, malaise/fatigue and weight loss.  Respiratory: Negative.  Negative for cough and shortness of breath.   Cardiovascular: Negative.  Negative for chest pain and leg swelling.  Gastrointestinal: Negative.  Negative for abdominal pain and nausea.  Genitourinary: Negative.  Negative for dysuria.  Musculoskeletal: Negative.  Negative for myalgias.  Skin: Negative.  Negative for rash.  Neurological: Negative.  Negative for sensory change, focal weakness and weakness.  Psychiatric/Behavioral: The patient is nervous/anxious.     As per HPI. Otherwise, a complete review of systems is negative.  PAST MEDICAL HISTORY: Past Medical History:  Diagnosis Date  . Anxiety   . Depression   . MS (multiple sclerosis) (HCC)   . Neuromuscular disorder (HCC)     PAST SURGICAL HISTORY: Past Surgical History:  Procedure Laterality Date  . TYMPANOSTOMY TUBE PLACEMENT      FAMILY HISTORY Family History  Problem Relation  Age of Onset  . Hypertension Maternal Grandmother   . Cancer Paternal Grandfather        throat  . Multiple sclerosis Paternal Grandfather   . Anxiety disorder Mother        ADVANCED DIRECTIVES:    HEALTH MAINTENANCE: Social History   Tobacco Use  . Smoking status: Former Smoker    Packs/day: 0.50    Types: E-cigarettes  . Smokeless tobacco: Never Used  Substance Use Topics  . Alcohol use: No  . Drug use: No     Colonoscopy:  PAP:  Bone density:  Lipid panel:  Allergies  Allergen Reactions  . Reglan [Metoclopramide] Other (See Comments)    Hyperactivity /becomes mean  . Ibuprofen Rash  . Penicillins Rash    Has patient had a PCN reaction causing immediate rash, facial/tongue/throat swelling, SOB or lightheadedness with hypotension: No Has patient had a PCN reaction causing severe rash involving mucus membranes or skin necrosis: No Has patient had a PCN reaction that required hospitalization: No Has patient had a PCN reaction occurring within the last 10 years: No If all of the above answers are "NO", then may proceed with Cephalosporin use.   . Sulfa Antibiotics Rash  . Sulfasalazine Rash    Current Outpatient Medications  Medication Sig Dispense Refill  . Cholecalciferol (VITAMIN D PO) Take 1 tablet by mouth daily.    . citalopram (CELEXA) 40 MG tablet Take 1 tablet (40 mg total) at bedtime by mouth.    . Cyanocobalamin (B-12 PO) Take 1 tablet by mouth daily.    . medroxyPROGESTERone (DEPO-PROVERA) 150 MG/ML injection INJECT 1 ML (150 MG TOTAL) INTO THE MUSCLE EVERY 3 (  THREE) MONTHS. 1 mL 4  . ALPRAZolam (XANAX) 0.25 MG tablet Take 0.25 mg by mouth at bedtime as needed for anxiety.    . chlorhexidine (PERIDEX) 0.12 % solution SWISH 1/2 OZ FOR 30 SECONDS TWICE DAILY  0  . ondansetron (ZOFRAN ODT) 4 MG disintegrating tablet Take 1 tablet (4 mg total) by mouth every 8 (eight) hours as needed for nausea or vomiting. (Patient not taking: Reported on 11/23/2017) 20  tablet 0  . promethazine (PHENERGAN) 12.5 MG suppository Place 1 suppository (12.5 mg total) rectally every 6 (six) hours as needed for nausea or vomiting. (Patient not taking: Reported on 06/20/2017) 12 each 0  . ranitidine (ZANTAC) 150 MG tablet Take 1 tablet (150 mg total) by mouth 2 (two) times daily. (Patient not taking: Reported on 06/20/2017) 60 tablet 1  . SUMAtriptan (IMITREX) 100 MG tablet Take 50-100 mg by mouth daily as needed.     No current facility-administered medications for this visit.    Facility-Administered Medications Ordered in Other Visits  Medication Dose Route Frequency Provider Last Rate Last Dose  . 0.9 %  sodium chloride infusion   Intravenous Once Jeralyn Ruths, MD      . acetaminophen (TYLENOL) tablet 1,000 mg  1,000 mg Oral Once Jeralyn Ruths, MD      . diphenhydrAMINE (BENADRYL) capsule 50 mg  50 mg Oral Once Jeralyn Ruths, MD      . methylPREDNISolone sodium succinate (SOLU-MEDROL) 125 mg/2 mL injection 100 mg  100 mg Intravenous Once Jeralyn Ruths, MD      . riTUXimab (RITUXAN) 1,000 mg in sodium chloride 0.9 % 150 mL chemo infusion  1,000 mg Intravenous Once Jeralyn Ruths, MD        OBJECTIVE: Vitals:   11/23/17 0936  BP: (!) 138/94  Pulse: 80  Resp: 18  Temp: (!) 97 F (36.1 C)     Body mass index is 17.44 kg/m.    ECOG FS:0 - Asymptomatic  General: Thin, no acute distress. Eyes: Pink conjunctiva, anicteric sclera. Lungs: Clear to auscultation bilaterally. Heart: Regular rate and rhythm. No rubs, murmurs, or gallops. Abdomen: Soft, nontender, nondistended. No organomegaly noted, normoactive bowel sounds. Musculoskeletal: No edema, cyanosis, or clubbing. Neuro: Alert, answering all questions appropriately. Cranial nerves grossly intact. Skin: No rashes or petechiae noted. Psych: Normal affect.  LAB RESULTS:  Lab Results  Component Value Date   NA 138 06/20/2017   K 3.2 (L) 06/20/2017   CL 106 06/20/2017   CO2 22  06/20/2017   GLUCOSE 93 06/20/2017   BUN 11 06/20/2017   CREATININE 0.65 06/20/2017   CALCIUM 8.6 (L) 06/20/2017   PROT 8.1 05/29/2017   ALBUMIN 5.3 (H) 05/29/2017   AST 30 05/29/2017   ALT 14 05/29/2017   ALKPHOS 47 05/29/2017   BILITOT 1.5 (H) 05/29/2017   GFRNONAA >60 06/20/2017   GFRAA >60 06/20/2017    Lab Results  Component Value Date   WBC 13.4 (H) 06/20/2017   NEUTROABS 3.6 03/28/2017   HGB 17.2 (H) 06/20/2017   HCT 48.8 (H) 06/20/2017   MCV 86.4 06/20/2017   PLT 450 (H) 06/20/2017     STUDIES: No results found.  ASSESSMENT: Rituxan infusion for multiple sclerosis.  PLAN:    1. Multiple sclerosis: Proceed with 1000 mg IV Rituxan today.  Per neurology, patient only requires treatment every 6 months.  Patient expressed understanding that all laboratory work and imaging will be monitored by her primary neurologist. She also understands  that she has any questions regarding her multiple sclerosis or side effects to Rituxan to direct them towards Dr. Malvin Johns. Return to clinic in 6 months for further evaluation and continuation of Rituxan.  2.  Nausea: Patient does not complain of this today.  I spent a total of 30 minutes face-to-face with the patient of which greater than 50% of the visit was spent in counseling and coordination of care as detailed above.   Patient expressed understanding and was in agreement with this plan. She also understands that She can call clinic at any time with any questions, concerns, or complaints.    Jeralyn Ruths, MD   11/29/2017 1:25 PM

## 2017-11-23 ENCOUNTER — Inpatient Hospital Stay: Payer: Self-pay | Attending: Oncology | Admitting: Oncology

## 2017-11-23 ENCOUNTER — Inpatient Hospital Stay: Payer: Self-pay

## 2017-11-23 ENCOUNTER — Other Ambulatory Visit: Payer: Self-pay

## 2017-11-23 VITALS — BP 138/94 | HR 80 | Temp 97.0°F | Resp 18 | Wt 104.8 lb

## 2017-11-23 DIAGNOSIS — Z5112 Encounter for antineoplastic immunotherapy: Secondary | ICD-10-CM | POA: Insufficient documentation

## 2017-11-23 DIAGNOSIS — G35 Multiple sclerosis: Secondary | ICD-10-CM | POA: Insufficient documentation

## 2017-11-23 MED ORDER — DIPHENHYDRAMINE HCL 25 MG PO CAPS
50.0000 mg | ORAL_CAPSULE | Freq: Once | ORAL | Status: AC
  Administered 2017-11-23: 50 mg via ORAL

## 2017-11-23 MED ORDER — DIPHENHYDRAMINE HCL 25 MG PO CAPS
ORAL_CAPSULE | ORAL | Status: AC
  Filled 2017-11-23: qty 2

## 2017-11-23 MED ORDER — METHYLPREDNISOLONE SODIUM SUCC 125 MG IJ SOLR
100.0000 mg | Freq: Once | INTRAMUSCULAR | Status: AC
  Administered 2017-11-23: 100 mg via INTRAVENOUS

## 2017-11-23 MED ORDER — SODIUM CHLORIDE 0.9 % IV SOLN
Freq: Once | INTRAVENOUS | Status: AC
  Administered 2017-11-23: 10:00:00 via INTRAVENOUS
  Filled 2017-11-23: qty 1000

## 2017-11-23 MED ORDER — SODIUM CHLORIDE 0.9 % IV SOLN
1000.0000 mg | Freq: Once | INTRAVENOUS | Status: AC
  Administered 2017-11-23: 1000 mg via INTRAVENOUS
  Filled 2017-11-23: qty 100

## 2017-11-23 MED ORDER — ACETAMINOPHEN 500 MG PO TABS
ORAL_TABLET | ORAL | Status: AC
  Filled 2017-11-23: qty 2

## 2017-11-23 MED ORDER — ACETAMINOPHEN 500 MG PO TABS
1000.0000 mg | ORAL_TABLET | Freq: Once | ORAL | Status: AC
  Administered 2017-11-23: 1000 mg via ORAL

## 2017-11-23 NOTE — Progress Notes (Signed)
Here for follow up and tx- stated " doing fine I guess "

## 2017-12-10 ENCOUNTER — Other Ambulatory Visit: Payer: Self-pay | Admitting: Unknown Physician Specialty

## 2017-12-11 NOTE — Telephone Encounter (Signed)
LOV  06/02/15 Jennifer Watts Last refill 11/29/16  1 ml with 4 refills

## 2018-02-21 ENCOUNTER — Encounter: Payer: Self-pay | Admitting: Emergency Medicine

## 2018-02-21 ENCOUNTER — Other Ambulatory Visit: Payer: Self-pay

## 2018-02-21 ENCOUNTER — Ambulatory Visit
Admission: EM | Admit: 2018-02-21 | Discharge: 2018-02-21 | Disposition: A | Discharge status: Home / Self Care | Payer: Medicaid Other | Attending: Family Medicine | Admitting: Family Medicine

## 2018-02-21 DIAGNOSIS — R350 Frequency of micturition: Secondary | ICD-10-CM

## 2018-02-21 DIAGNOSIS — Z113 Encounter for screening for infections with a predominantly sexual mode of transmission: Secondary | ICD-10-CM

## 2018-02-21 DIAGNOSIS — N898 Other specified noninflammatory disorders of vagina: Secondary | ICD-10-CM

## 2018-02-21 DIAGNOSIS — R3915 Urgency of urination: Secondary | ICD-10-CM

## 2018-02-21 LAB — WET PREP, GENITAL
CLUE CELLS WET PREP: NONE SEEN
Sperm: NONE SEEN
Trich, Wet Prep: NONE SEEN
Yeast Wet Prep HPF POC: NONE SEEN

## 2018-02-21 LAB — URINALYSIS, COMPLETE (UACMP) WITH MICROSCOPIC
Bilirubin Urine: NEGATIVE
GLUCOSE, UA: NEGATIVE mg/dL
Ketones, ur: NEGATIVE mg/dL
NITRITE: NEGATIVE
PH: 6 (ref 5.0–8.0)
PROTEIN: NEGATIVE mg/dL
Specific Gravity, Urine: 1.015 (ref 1.005–1.030)

## 2018-02-21 LAB — CHLAMYDIA/NGC RT PCR (ARMC ONLY)
CHLAMYDIA TR: NOT DETECTED
N GONORRHOEAE: NOT DETECTED

## 2018-02-21 MED ORDER — FLUCONAZOLE 150 MG PO TABS: ORAL_TABLET | ORAL | 1 refills | Status: DC

## 2018-02-21 MED ORDER — NITROFURANTOIN MONOHYD MACRO 100 MG PO CAPS: 100.0000 mg | ORAL_CAPSULE | Freq: Two times a day (BID) | ORAL | 0 refills | Status: DC

## 2018-02-21 NOTE — Discharge Instructions (Signed)
Medication as prescribed.  We will call with the testing results if positive.  Take care  Dr. Adriana Simas

## 2018-02-21 NOTE — ED Provider Notes (Signed)
[facility]    CSN: [id number] Arrival date & time: 02/21/18  1647  History   Chief Complaint Chief Complaint  Patient presents with  . Dysuria    APPT  . Vaginal Discharge   HPI  22 year old female presents with vaginal discharge and urinary symptoms.  2-week history of vaginal discharge.  White.  Associated vaginal itching and irritation.  Urinary frequency and urgency.  No reports of dysuria.  She reported this to the nurse but apparently this is not accurate.  She is been using over-the-counter Vagisil and Monistat without resolution.  She is sexually active.  She states that she is not concerned about STDs but would like to be checked just to be cautious.  No abdominal pain.  No fevers or chills.  No other associated symptoms.  No other complaints.   PMH, Surgical Hx, Family Hx, Social History reviewed and updated as below.  Past Medical History:  Diagnosis Date  . Anxiety   . Depression   . MS (multiple sclerosis) (HCC)   . Neuromuscular disorder [facility])    Patient Active Problem List   Diagnosis Date Noted  . Mood disorder (HCC) 03/14/2017  . Migraine without aura and without status migrainosus, not intractable 03/14/2017  . Panic disorder 07/08/2015  . Suicidal ideation 07/08/2015  . 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 Tobacco use disorder 07/08/2015  . Cannabis use disorder, moderate, dependence (HCC) 07/08/2015  . Dysmenorrhea in adolescent 06/02/2015  . Severe anxiety 10/28/2014  . Multiple sclerosis (HCC) 10/26/2014   Past Surgical History:  Procedure Laterality Date  . TYMPANOSTOMY TUBE PLACEMENT     OB History   None    Home Medications    Prior to Admission medications   Medication Sig Start Date End Date Taking? Authorizing Provider  Cholecalciferol (VITAMIN D PO) Take 1 tablet by mouth daily.   Yes [provider]  Cyanocobalamin (B-12 PO) Take 1 tablet by mouth daily.   Yes  [provider]  medroxyPROGESTERone (DEPO-PROVERA) 150 MG/ML injection INJECT 1 ML (150 MG TOTAL) INTO THE MUSCLE EVERY 3 (THREE) MONTHS. 12/11/17  Yes Gabriel Cirri, NP  SUMAtriptan (IMITREX) 100 MG tablet Take 50-100 mg by mouth daily as needed. 01/08/17  Yes [provider]  fluconazole (DIFLUCAN) 150 MG tablet Repeat dose in 72 hours. 02/21/18   Tommie Sams, DO  nitrofurantoin, macrocrystal-monohydrate, (MACROBID) 100 MG capsule Take 1 capsule (100 mg total) by mouth 2 (two) times daily. 02/21/18   Tommie Sams, DO    Family History Family History  Problem Relation Age of Onset  . Hypertension Maternal Grandmother   . Cancer Paternal Grandfather        throat  . Multiple sclerosis Paternal Grandfather   . Anxiety disorder Mother     Social History Social History   Tobacco Use  . Smoking status: Current Every Day Smoker    Packs/day: 0.50    Types: Cigarettes  . Smokeless tobacco: Never Used  Substance Use Topics  . Alcohol use: No  . Drug use: No     Allergies   Reglan [metoclopramide]; Ibuprofen; Penicillins; Sulfa antibiotics; and Sulfasalazine   Review of Systems Review of Systems  Constitutional: Negative.   Genitourinary: Positive for frequency, urgency and vaginal discharge.   Physical Exam Triage Vital Signs ED Triage Vitals  Enc Vitals Group     BP 02/21/18 1657 (!) 144/98     Pulse Rate 02/21/18 1657 95  Resp 02/21/18 1657 16     Temp 02/21/18 1657 98.8 F (37.1 C)     Temp Source 02/21/18 1657 Oral     SpO2 02/21/18 1657 100 %     Weight 02/21/18 1658 105 lb (47.6 kg)     Height 02/21/18 1658 5\' 5"  (1.651 m)     Head Circumference --      Peak Flow --      Pain Score 02/21/18 1658 4     Pain Loc --      Pain Edu? --      Excl. in GC? --    Updated Vital Signs BP (!) 144/98 (BP Location: Left Arm)   Pulse 95   Temp 98.8 F (37.1 C) (Oral)   Resp 16   Ht 5\' 5"  (1.651 m)   Wt 47.6 kg   SpO2 100%   BMI 17.47 kg/m     Visual Acuity Right Eye Distance:   Left Eye Distance:   Bilateral Distance:    Right Eye Near:   Left Eye Near:    Bilateral Near:     Physical Exam  Constitutional: She is oriented to person, place, and time.  Thin female in NAD.   HENT:  Head: Normocephalic and atraumatic.  Cardiovascular: Normal rate and regular rhythm.  Pulmonary/Chest: Effort normal and breath sounds normal. She has no wheezes. She has no rales.  Neurological: She is alert and oriented to person, place, and time.  Psychiatric:  Flat affect.   Nursing note and vitals reviewed.  UC Treatments / Results  Labs (all labs ordered are listed, but only abnormal results are displayed) Labs Reviewed  WET PREP, GENITAL - Abnormal; Notable for the following components:      Result Value   WBC, Wet Prep HPF POC MANY (*)    All other components within normal limits  URINALYSIS, COMPLETE (UACMP) WITH MICROSCOPIC - Abnormal; Notable for the following components:   Hgb urine dipstick TRACE (*)    Leukocytes, UA SMALL (*)    Bacteria, UA FEW (*)    All other components within normal limits  CHLAMYDIA/NGC RT PCR ([facility] ONLY)  URINE CULTURE    EKG None  Radiology No results found.  Procedures Procedures (including critical care time)  Medications Ordered in UC Medications - No data to display  Initial Impression / Assessment and Plan / UC Course  I have reviewed the triage vital signs and the nursing notes.  Pertinent labs & imaging results that were available during my care of the patient were reviewed by me and considered in my medical decision making (see chart for details).    22 year old female presents with vaginal discharge and urinary symptoms.  Wet prep was negative.  However, she is describing symptoms consistent with yeast vaginitis.  Urinalysis with pyuria.  Placing on Macrobid.  Diflucan as directed.  Awaiting STD testing.  Final Clinical Impressions(s) / UC Diagnoses   Final  diagnoses:  Vaginal discharge  Urinary frequency     Discharge Instructions     Medication as prescribed.  We will call with the testing results if positive.  Take care  Dr. Adriana Simas     ED Prescriptions    Medication Sig Dispense Auth. Provider   nitrofurantoin, macrocrystal-monohydrate, (MACROBID) 100 MG capsule Take 1 capsule (100 mg total) by mouth 2 (two) times daily. 10 capsule Rikki Trosper G, DO   fluconazole (DIFLUCAN) 150 MG tablet Repeat dose in 72 hours. 2 tablet [place], [place], DO  Controlled Substance Prescriptions [place] Controlled Substance Registry consulted? Not Applicable   Tommie Sams, DO 02/21/18 Silva Bandy

## 2018-02-21 NOTE — ED Triage Notes (Signed)
Patient states she is having burning with urination and vaginal discharged x 2 weeks. Patient has tried OTC Vagisil and Monistat without relief.

## 2018-02-23 LAB — URINE CULTURE

## 2018-03-14 ENCOUNTER — Other Ambulatory Visit: Payer: Self-pay | Admitting: Family Medicine

## 2018-03-22 ENCOUNTER — Ambulatory Visit (INDEPENDENT_AMBULATORY_CARE_PROVIDER_SITE_OTHER): Payer: Self-pay | Admitting: Unknown Physician Specialty

## 2018-03-22 ENCOUNTER — Encounter: Payer: Self-pay | Admitting: Unknown Physician Specialty

## 2018-03-22 VITALS — BP 140/97 | HR 61 | Temp 98.4°F | Wt 120.0 lb

## 2018-03-22 DIAGNOSIS — N898 Other specified noninflammatory disorders of vagina: Secondary | ICD-10-CM

## 2018-03-22 LAB — WET PREP FOR TRICH, YEAST, CLUE
CLUE CELL EXAM: POSITIVE — AB
TRICHOMONAS EXAM: NEGATIVE
YEAST EXAM: NEGATIVE

## 2018-03-22 MED ORDER — METRONIDAZOLE 500 MG PO TABS: 500.0000 mg | ORAL_TABLET | Freq: Three times a day (TID) | ORAL | 0 refills | Status: DC

## 2018-03-22 MED ORDER — FLUCONAZOLE 150 MG PO TABS: 150.0000 mg | ORAL_TABLET | Freq: Once | ORAL | 0 refills | Status: DC

## 2018-03-22 NOTE — Progress Notes (Signed)
BP (!) 140/97   Pulse 61   Temp 98.4 F (36.9 C) (Oral)   Wt 120 lb (54.4 kg)   SpO2 99%   BMI 19.97 kg/m    Subjective:    Patient ID: Jennifer Watts, female    DOB: 1995/09/23, 22 y.o.   MRN: [id number]  HPI: Jennifer Watts is a 22 y.o. female  Chief Complaint  Patient presents with  . Vaginitis   Pt states she went to urgent care last week.  Received Diflucan and STD testing.  States she has vaginal discharge which is burning, uncomfortable, and itching.  Some pain with intercourse. Taking depo.    Relevant past medical, surgical, family and social history reviewed and updated as indicated. Interim medical history since our last visit reviewed. Allergies and medications reviewed and updated.  Review of Systems  Constitutional: Negative.   HENT: Negative.   Eyes: Negative.   Respiratory: Negative.   Cardiovascular: Negative.   Gastrointestinal: Negative.   Endocrine: Negative.   Genitourinary: Negative.   Musculoskeletal: Negative.   Skin: Negative.   Allergic/Immunologic: Negative.   Neurological: Negative.   Hematological: Negative.   Psychiatric/Behavioral: Negative.     Per HPI unless specifically indicated above     Objective:    BP (!) 140/97   Pulse 61   Temp 98.4 F (36.9 C) (Oral)   Wt 120 lb (54.4 kg)   SpO2 99%   BMI 19.97 kg/m   Wt Readings from Last 3 Encounters:  03/22/18 120 lb (54.4 kg)  02/21/18 105 lb (47.6 kg)  11/23/17 104 lb 13.3 oz (47.6 kg)    Physical Exam  Constitutional: She is oriented to person, place, and time. She appears well-developed and well-nourished. No distress.  HENT:  Head: Normocephalic and atraumatic.  Eyes: Conjunctivae and lids are normal. Right eye exhibits no discharge. Left eye exhibits no discharge. No scleral icterus.  Neck: Normal range of motion. Neck supple. No JVD present. Carotid bruit is not present.  Abdominal: Normal appearance. There is no splenomegaly or hepatomegaly.  Musculoskeletal:  Normal range of motion.  Neurological: She is alert and oriented to person, place, and time.  Skin: Skin is warm, dry and intact. No rash noted. No pallor.  Psychiatric: She has a normal mood and affect. Her behavior is normal. Judgment and thought content normal.    Results for orders placed or performed during the hospital encounter of 02/21/18  Wet prep, genital  Result Value Ref Range   Yeast Wet Prep HPF POC NONE SEEN NONE SEEN   Trich, Wet Prep NONE SEEN NONE SEEN   Clue Cells Wet Prep HPF POC NONE SEEN NONE SEEN   WBC, Wet Prep HPF POC MANY (A) NONE SEEN   Sperm NONE SEEN   Chlamydia/NGC rt PCR ([facility] only)  Result Value Ref Range   Specimen source GC/Chlam ENDOCERVICAL    Chlamydia Tr NOT DETECTED NOT DETECTED   N gonorrhoeae NOT DETECTED NOT DETECTED  Urine Culture  Result Value Ref Range   Specimen Description      URINE, RANDOM Performed at [facility], [address] [place], [place] [address]    Special Requests      NONE Performed at [facility], [address], [place], [place] [address]    Culture (A)     <10,000 COLONIES/mL INSIGNIFICANT GROWTH Performed at [facility], [address], [place], [place] [address]    Report Status 02/23/2018 FINAL  Urinalysis, Complete w Microscopic  Result Value Ref Range   Color, Urine YELLOW YELLOW   APPearance CLEAR CLEAR   Specific Gravity, Urine 1.015 1.005 - 1.030   pH 6.0 5.0 - 8.0   Glucose, UA NEGATIVE NEGATIVE mg/dL   Hgb urine dipstick TRACE (A) NEGATIVE   Bilirubin Urine NEGATIVE NEGATIVE   Ketones, ur NEGATIVE NEGATIVE mg/dL   Protein, ur NEGATIVE NEGATIVE mg/dL   Nitrite NEGATIVE NEGATIVE   Leukocytes, UA SMALL (A) NEGATIVE   Squamous Epithelial / LPF 0-5 0 - 5   WBC, UA 6-10 0 - 5 WBC/hpf   RBC / HPF 0-5 0 - 5 RBC/hpf   Bacteria, UA FEW (A) NONE SEEN   Mucus PRESENT       Assessment & Plan:   Problem List Items Addressed This Visit    None    Visit Diagnoses     Vaginal discharge    -  Primary   Self-swab.  Positive clue cells and negative for yeast.  Rx for Metronidazole.  Repeat Diflucan as symptomatic.     Relevant Orders   WET PREP FOR TRICH, YEAST, CLUE       Follow up plan: Return if symptoms worsen or fail to improve.

## 2018-03-22 NOTE — Patient Instructions (Signed)
Bacterial Vaginosis Bacterial vaginosis is a vaginal infection that occurs when the normal balance of bacteria in the vagina is disrupted. It results from an overgrowth of certain bacteria. This is the most common vaginal infection among women ages 15-44. Because bacterial vaginosis increases your risk for STIs (sexually transmitted infections), getting treated can help reduce your risk for chlamydia, gonorrhea, herpes, and HIV (human immunodeficiency virus). Treatment is also important for preventing complications in pregnant women, because this condition can cause an early (premature) delivery. What are the causes? This condition is caused by an increase in harmful bacteria that are normally present in small amounts in the vagina. However, the reason that the condition develops is not fully understood. What increases the risk? The following factors may make you more likely to develop this condition:  Having a new sexual partner or multiple sexual partners.  Having unprotected sex.  Douching.  Having an intrauterine device (IUD).  Smoking.  Drug and alcohol abuse.  Taking certain antibiotic medicines.  Being pregnant.  You cannot get bacterial vaginosis from toilet seats, bedding, swimming pools, or contact with objects around you. What are the signs or symptoms? Symptoms of this condition include:  Grey or white vaginal discharge. The discharge can also be watery or foamy.  A fish-like odor with discharge, especially after sexual intercourse or during menstruation.  Itching in and around the vagina.  Burning or pain with urination.  Some women with bacterial vaginosis have no signs or symptoms. How is this diagnosed? This condition is diagnosed based on:  Your medical history.  A physical exam of the vagina.  Testing a sample of vaginal fluid under a microscope to look for a large amount of bad bacteria or abnormal cells. Your health care provider may use a cotton swab  or a small wooden spatula to collect the sample.  How is this treated? This condition is treated with antibiotics. These may be given as a pill, a vaginal cream, or a medicine that is put into the vagina (suppository). If the condition comes back after treatment, a second round of antibiotics may be needed. Follow these instructions at home: Medicines  Take over-the-counter and prescription medicines only as told by your health care provider.  Take or use your antibiotic as told by your health care provider. Do not stop taking or using the antibiotic even if you start to feel better. General instructions  If you have a female sexual partner, tell her that you have a vaginal infection. She should see her health care provider and be treated if she has symptoms. If you have a female sexual partner, he does not need treatment.  During treatment: ? Avoid sexual activity until you finish treatment. ? Do not douche. ? Avoid alcohol as directed by your health care provider. ? Avoid breastfeeding as directed by your health care provider.  Drink enough water and fluids to keep your urine clear or pale yellow.  Keep the area around your vagina and rectum clean. ? Wash the area daily with warm water. ? Wipe yourself from front to back after using the toilet.  Keep all follow-up visits as told by your health care provider. This is important. How is this prevented?  Do not douche.  Wash the outside of your vagina with warm water only.  Use protection when having sex. This includes latex condoms and dental dams.  Limit how many sexual partners you have. To help prevent bacterial vaginosis, it is best to have sex with just   one partner (monogamous).  Make sure you and your sexual partner are tested for STIs.  Wear cotton or cotton-lined underwear.  Avoid wearing tight pants and pantyhose, especially during summer.  Limit the amount of alcohol that you drink.  Do not use any products that  contain nicotine or tobacco, such as cigarettes and e-cigarettes. If you need help quitting, ask your health care provider.  Do not use illegal drugs. Where to find more information:  Centers for Disease Control and Prevention: www.cdc.gov/std  American Sexual Health Association (ASHA): www.ashastd.org  U.S. Department of Health and Human Services, Office on Women's Health: www.womenshealth.gov/ or https://www.womenshealth.gov/a-z-topics/bacterial-vaginosis Contact a health care provider if:  Your symptoms do not improve, even after treatment.  You have more discharge or pain when urinating.  You have a fever.  You have pain in your abdomen.  You have pain during sex.  You have vaginal bleeding between periods. Summary  Bacterial vaginosis is a vaginal infection that occurs when the normal balance of bacteria in the vagina is disrupted.  Because bacterial vaginosis increases your risk for STIs (sexually transmitted infections), getting treated can help reduce your risk for chlamydia, gonorrhea, herpes, and HIV (human immunodeficiency virus). Treatment is also important for preventing complications in pregnant women, because the condition can cause an early (premature) delivery.  This condition is treated with antibiotic medicines. These may be given as a pill, a vaginal cream, or a medicine that is put into the vagina (suppository). This information is not intended to replace advice given to you by your health care provider. Make sure you discuss any questions you have with your health care provider. Document Released: 05/01/2005 Document Revised: 09/04/2016 Document Reviewed: 01/15/2016 Elsevier Interactive Patient Education  2018 [employer]  

## 2018-04-03 ENCOUNTER — Other Ambulatory Visit: Payer: Self-pay | Admitting: Unknown Physician Specialty

## 2018-04-03 NOTE — Telephone Encounter (Signed)
Requested medication (s) are due for refill today: no  Requested medication (s) are on the active medication list: yes  Last refill:  03/22/18  Future visit scheduled: No  Notes to clinic:  Unable to refill per protocol    Requested Prescriptions  Pending Prescriptions Disp Refills   metroNIDAZOLE (FLAGYL) 500 MG tablet [Pharmacy Med Name: METRONIDAZOLE 500 MG TABLET] 21 tablet 0    Sig: TAKE 1 TABLET BY MOUTH THREE TIMES A DAY     Off-Protocol Failed - 04/03/2018  3:24 PM      Failed - Medication not assigned to a protocol, review manually.      Passed - Valid encounter within last 12 months    Recent Outpatient Visits          1 week ago Vaginal discharge   [facility] Gabriel Cirri, NP   2 years ago Immunization due   [facility] Gabriel Cirri, NP   3 years ago Severe anxiety   [facility] Gabriel Cirri, NP

## 2018-04-04 ENCOUNTER — Other Ambulatory Visit: Payer: Self-pay | Admitting: Nurse Practitioner

## 2018-04-04 MED ORDER — METRONIDAZOLE 500 MG PO TABS: 500.0000 mg | ORAL_TABLET | Freq: Two times a day (BID) | ORAL | 0 refills | Status: AC

## 2018-04-04 NOTE — Telephone Encounter (Signed)
Patient can be reached at [phone] or the number on file.  She is some better with the discomfort but the reason for the call was to let Jennifer Watts know the antibiotic she gave her was to strong and it makes her sick on her stomach. She is wanting to know if she can either be put on something different or instead of 3 a day once a day.  Please advise.  Thank you

## 2018-04-04 NOTE — Telephone Encounter (Signed)
This patient was treated with the recommended dose by Elnita Maxwell for 7 days, not recommended to go past that.  If she is still having vaginal discomfort I would recommend she return for visit.

## 2018-04-04 NOTE — Progress Notes (Signed)
Spoke to patient via telephone and Flagyl three times a day has been causing stomach upset and she has been unable to take regularly and not taken it as ordered.  Discussed with her that we can change this to twice a day dosing, recommended for BV,  Advised her to take medication with food, preferably yogurt with probiotic and to take a probiotic tablet twice a day while on medication.  Advised her against using alcohol with medication.  Script sent.

## 2018-04-14 ENCOUNTER — Other Ambulatory Visit: Payer: Self-pay | Admitting: Unknown Physician Specialty

## 2018-04-15 NOTE — Telephone Encounter (Signed)
Patient called on home number listed and spoke to her mother. She says to call [place] and gave the cell number to reach her. Mother says she's "still having issues down there, I called in for a refill of the medicine, but think you need to talk to her more about what's going on."   Patient called to discuss the reason for Diflucan. She says she has yellowish discharge and she's not sure if it's a yeast infection or the bacterial infection is still there. Completed the Flagyl yesterday. Took the Diflucan on 03/23/18 and the discharge cleared up, but towards the end it came back. I advised I will send the refill request for approval. She says if she needs and appointment, she will come in, but wanted me to try sending it over first.

## 2018-04-15 NOTE — Telephone Encounter (Signed)
If continued discharge she will need to come in.  I will refill this today only.

## 2018-04-22 ENCOUNTER — Ambulatory Visit: Payer: Self-pay

## 2018-04-22 NOTE — Telephone Encounter (Signed)
Incoming call from Patient with complaint yeast infection symptoms.  Patient states that she continues to   have burning , itching, labia is itching.  Patient reports completing all doses of Flagyl 500.mg.   Patient reports that labia area are  Swollen. Patient reports she has the same symptoms.   Reports same Sx.  Scheduled appointment for 04/24/18 Wednesday .  Patient is to arrive at 9:15am, for a 9;30 appointment. Patient voiced understanding.  Reviewed care advice with Patient.  Patient voiced understanding.      Reason for Disposition . [1] Finished taking antibiotics AND [2] symptoms are BETTER but [3] not completely gone  Answer Assessment - Initial Assessment Questions 1. INFECTION: "What infection is the antibiotic being given for?"     For yeast infection. And bacteria vaginosis 2. ANTIBIOTIC: "What antibiotic are you taking" "How many times per day?"     Flagyl 2 to 3 times per day.  500mg . 3. DURATION: "When was the antibiotic started?"     About a week and 1/2 ago.   4. MAIN CONCERN OR SYMPTOM:  "What is your main concern right now?"     Same Sx.   5. BETTER-SAME-WORSE: "Are you getting better, staying the same, or getting worse compared to when you first started the antibiotics?" If getting worse, ask: "In what way?"      About the same 6. FEVER: "Do you have a fever?" If so, ask: "What is your temperature, how was it measured, and when did it start?"     No fever 7. SYMPTOMS: "Are there any other symptoms you're concerned about?" If so, ask: "When did it start?"     Same Sx. Discharge , itching 8. FOLLOW-UP APPOINTMENT: "Do you have a follow-up appointment with your doctor?"     No.  Protocols used: INFECTION ON ANTIBIOTIC FOLLOW-UP CALL-A-AH

## 2018-04-24 ENCOUNTER — Encounter: Payer: Self-pay | Admitting: Nurse Practitioner

## 2018-04-24 ENCOUNTER — Other Ambulatory Visit: Payer: Self-pay

## 2018-04-24 ENCOUNTER — Ambulatory Visit (INDEPENDENT_AMBULATORY_CARE_PROVIDER_SITE_OTHER): Payer: Self-pay | Admitting: Nurse Practitioner

## 2018-04-24 VITALS — BP 122/81 | HR 97 | Temp 98.1°F | Ht 65.0 in | Wt 100.0 lb

## 2018-04-24 DIAGNOSIS — B373 Candidiasis of vulva and vagina: Secondary | ICD-10-CM

## 2018-04-24 DIAGNOSIS — N760 Acute vaginitis: Secondary | ICD-10-CM

## 2018-04-24 DIAGNOSIS — B9689 Other specified bacterial agents as the cause of diseases classified elsewhere: Secondary | ICD-10-CM

## 2018-04-24 DIAGNOSIS — B3731 Acute candidiasis of vulva and vagina: Secondary | ICD-10-CM

## 2018-04-24 DIAGNOSIS — N898 Other specified noninflammatory disorders of vagina: Secondary | ICD-10-CM | POA: Insufficient documentation

## 2018-04-24 DIAGNOSIS — R300 Dysuria: Secondary | ICD-10-CM | POA: Diagnosis not present

## 2018-04-24 LAB — WET PREP FOR TRICH, YEAST, CLUE
Clue Cell Exam: POSITIVE — AB
Trichomonas Exam: NEGATIVE
Yeast Exam: POSITIVE — AB

## 2018-04-24 MED ORDER — METRONIDAZOLE 500 MG PO TABS: 500.0000 mg | ORAL_TABLET | Freq: Two times a day (BID) | ORAL | 0 refills | Status: DC

## 2018-04-24 MED ORDER — FLUCONAZOLE 150 MG PO TABS: 150.0000 mg | ORAL_TABLET | Freq: Once | ORAL | 0 refills | Status: AC

## 2018-04-24 NOTE — Assessment & Plan Note (Signed)
Fluconazole tablet sent to pharmacy.  Recommended f/u with Dr. Malvin Johns to discuss whether Rituxan causing increase risk for yeast infection.

## 2018-04-24 NOTE — Patient Instructions (Addendum)
Vaginal Yeast infection, Adult Vaginal yeast infection is a condition that causes soreness, swelling, and redness (inflammation) of the vagina. It also causes vaginal discharge. This is a common condition. Some women get this infection frequently. What are the causes? This condition is caused by a change in the normal balance of the yeast (candida) and bacteria that live in the vagina. This change causes an overgrowth of yeast, which causes the inflammation. What increases the risk? This condition is more likely to develop in:  Women who take antibiotic medicines.  Women who have diabetes.  Women who take birth control pills.  Women who are pregnant.  Women who douche often.  Women who have a weak defense (immune) system.  Women who have been taking steroid medicines for a long time.  Women who frequently wear tight clothing.  What are the signs or symptoms? Symptoms of this condition include:  White, thick vaginal discharge.  Swelling, itching, redness, and irritation of the vagina. The lips of the vagina (vulva) may be affected as well.  Pain or a burning feeling while urinating.  Pain during sex.  How is this diagnosed? This condition is diagnosed with a medical history and physical exam. This will include a pelvic exam. Your health care provider will examine a sample of your vaginal discharge under a microscope. Your health care provider may send this sample for testing to confirm the diagnosis. How is this treated? This condition is treated with medicine. Medicines may be over-the-counter or prescription. You may be told to use one or more of the following:  Medicine that is taken orally.  Medicine that is applied as a cream.  Medicine that is inserted directly into the vagina (suppository).  Follow these instructions at home:  Take or apply over-the-counter and prescription medicines only as told by your health care provider.  Do not have sex until your health  care provider has approved. Tell your sex partner that you have a yeast infection. That person should go to his or her health care provider if he or she develops symptoms.  Do not wear tight clothes, such as pantyhose or tight pants.  Avoid using tampons until your health care provider approves.  Eat more yogurt. This may help to keep your yeast infection from returning.  Try taking a sitz bath to help with discomfort. This is a warm water bath that is taken while you are sitting down. The water should only come up to your hips and should cover your buttocks. Do this 3-4 times per day or as told by your health care provider.  Do not douche.  Wear breathable, cotton underwear.  If you have diabetes, keep your blood sugar levels under control. Contact a health care provider if:  You have a fever.  Your symptoms go away and then return.  Your symptoms do not get better with treatment.  Your symptoms get worse.  You have new symptoms.  You develop blisters in or around your vagina.  You have blood coming from your vagina and it is not your menstrual period.  You develop pain in your abdomen. This information is not intended to replace advice given to you by your health care provider. Make sure you discuss any questions you have with your health care provider. Document Released: 02/08/2005 Document Revised: 10/13/2015 Document Reviewed: 11/02/2014 Elsevier Interactive Patient Education  2018 [employer]. Metronidazole tablets or capsules What is this medicine? METRONIDAZOLE (me troe NI da zole) is an antiinfective. It is used to  treat certain kinds of bacterial and protozoal infections. It will not work for colds, flu, or other viral infections. This medicine may be used for other purposes; ask your health care provider or pharmacist if you have questions. COMMON BRAND NAME(S): Flagyl What should I tell my health care provider before I take this medicine? They need to know if  you have any of these conditions: -anemia or other blood disorders -disease of the nervous system -fungal or yeast infection -if you drink alcohol containing drinks -liver disease -seizures -an unusual or allergic reaction to metronidazole, or other medicines, foods, dyes, or preservatives -pregnant or trying to get pregnant -breast-feeding How should I use this medicine? Take this medicine by mouth with a full glass of water. Follow the directions on the prescription label. Take your medicine at regular intervals. Do not take your medicine more often than directed. Take all of your medicine as directed even if you think you are better. Do not skip doses or stop your medicine early. Talk to your pediatrician regarding the use of this medicine in children. Special care may be needed. Overdosage: If you think you have taken too much of this medicine contact a poison control center or emergency room at once. NOTE: This medicine is only for you. Do not share this medicine with others. What if I miss a dose? If you miss a dose, take it as soon as you can. If it is almost time for your next dose, take only that dose. Do not take double or extra doses. What may interact with this medicine? Do not take this medicine with any of the following medications: -alcohol or any product that contains alcohol -amprenavir oral solution -cisapride -disulfiram -dofetilide -dronedarone -paclitaxel injection -pimozide -ritonavir oral solution -sertraline oral solution -sulfamethoxazole-trimethoprim injection -thioridazine -ziprasidone This medicine may also interact with the following medications: -birth control pills -cimetidine -lithium -other medicines that prolong the QT interval (cause an abnormal heart rhythm) -phenobarbital -phenytoin -warfarin This list may not describe all possible interactions. Give your health care provider a list of all the medicines, herbs, non-prescription drugs, or  dietary supplements you use. Also tell them if you smoke, drink alcohol, or use illegal drugs. Some items may interact with your medicine. What should I watch for while using this medicine? Tell your doctor or health care professional if your symptoms do not improve or if they get worse. You may get drowsy or dizzy. Do not drive, use machinery, or do anything that needs mental alertness until you know how this medicine affects you. Do not stand or sit up quickly, especially if you are an older patient. This reduces the risk of dizzy or fainting spells. Avoid alcoholic drinks while you are taking this medicine and for three days afterward. Alcohol may make you feel dizzy, sick, or flushed. If you are being treated for a sexually transmitted disease, avoid sexual contact until you have finished your treatment. Your sexual partner may also need treatment. What side effects may I notice from receiving this medicine? Side effects that you should report to your doctor or health care professional as soon as possible: -allergic reactions like skin rash or hives, swelling of the face, lips, or tongue -confusion, clumsiness -difficulty speaking -discolored or sore mouth -dizziness -fever, infection -numbness, tingling, pain or weakness in the hands or feet -trouble passing urine or change in the amount of urine -redness, blistering, peeling or loosening of the skin, including inside the mouth -seizures -unusually weak or tired -vaginal irritation,  dryness, or discharge Side effects that usually do not require medical attention (report to your doctor or health care professional if they continue or are bothersome): -diarrhea -headache -irritability -metallic taste -nausea -stomach pain or cramps -trouble sleeping This list may not describe all possible side effects. Call your doctor for medical advice about side effects. You may report side effects to FDA at 1-800-FDA-1088. Where should I keep my  medicine? Keep out of the reach of children. Store at room temperature below 25 degrees C (77 degrees F). Protect from light. Keep container tightly closed. Throw away any unused medicine after the expiration date. NOTE: This sheet is a summary. It may not cover all possible information. If you have questions about this medicine, talk to your doctor, pharmacist, or health care provider.  2018 Elsevier/Gold Standard (2012-12-06 14:08:39)  Bacterial Vaginosis Bacterial vaginosis is an infection of the vagina. It happens when too many germs (bacteria) grow in the vagina. This infection puts you at risk for infections from sex (STIs). Treating this infection can lower your risk for some STIs. You should also treat this if you are pregnant. It can cause your baby to be born early. Follow these instructions at home: Medicines  Take over-the-counter and prescription medicines only as told by your doctor.  Take or use your antibiotic medicine as told by your doctor. Do not stop taking or using it even if you start to feel better. General instructions  If you your sexual partner is a woman, tell her that you have this infection. She needs to get treatment if she has symptoms. If you have a female partner, he does not need to be treated.  During treatment: ? Avoid sex. ? Do not douche. ? Avoid alcohol as told. ? Avoid breastfeeding as told.  Drink enough fluid to keep your pee (urine) clear or pale yellow.  Keep your vagina and butt (rectum) clean. ? Wash the area with warm water every day. ? Wipe from front to back after you use the toilet.  Keep all follow-up visits as told by your doctor. This is important. Preventing this condition  Do not douche.  Use only warm water to wash around your vagina.  Use protection when you have sex. This includes: ? Latex condoms. ? Dental dams.  Limit how many people you have sex with. It is best to only have sex with the same person (be  monogamous).  Get tested for STIs. Have your partner get tested.  Wear underwear that is cotton or lined with cotton.  Avoid tight pants and pantyhose. This is most important in summer.  Do not use any products that have nicotine or tobacco in them. These include cigarettes and e-cigarettes. If you need help quitting, ask your doctor.  Do not use illegal drugs.  Limit how much alcohol you drink. Contact a doctor if:  Your symptoms do not get better, even after you are treated.  You have more discharge or pain when you pee (urinate).  You have a fever.  You have pain in your belly (abdomen).  You have pain with sex.  Your bleed from your vagina between periods. Summary  This infection happens when too many germs (bacteria) grow in the vagina.  Treating this condition can lower your risk for some infections from sex (STIs).  You should also treat this if you are pregnant. It can cause early (premature) birth.  Do not stop taking or using your antibiotic medicine even if you start to feel  better. This information is not intended to replace advice given to you by your health care provider. Make sure you discuss any questions you have with your health care provider. Document Released: 02/08/2008 Document Revised: 01/15/2016 Document Reviewed: 01/15/2016 Elsevier Interactive Patient Education  2017 [employer].

## 2018-04-24 NOTE — Progress Notes (Signed)
BP 122/81   Pulse 97   Temp 98.1 F (36.7 C) (Oral)   Ht 5\' 5"  (1.651 m)   Wt 100 lb (45.4 kg)   SpO2 98%   BMI 16.64 kg/m    Subjective:    Patient ID: Jennifer Watts, female    DOB: [birth year], 22 y.o.   MRN: [id number]  HPI: Jennifer Watts is a 22 y.o. female  Chief Complaint  Patient presents with  . Vaginal Discharge    f/u pt states its itching and burns when urinate    VAGINAL DISCHARGE Having chronic issues with yeast infections since 02/21/18.  Recently treated for yeast and BV in November.  Currently followed by Dr. Malvin Johns for MS, she receives Rituxan infusions.  Last treatment approx. 6 months ago, receives treatments every 7 months. Duration: days, it did improve with medicine and a couple days later will return Discharge description: yellow  Pruritus: yes Dysuria: yes Malodorous: no Urinary frequency: no Fevers: no Abdominal pain: no  Sexual activity: monogamous History of sexually transmitted diseases: no Recent antibiotic use: yes Context: recurrent yeast infections  Treatments attempted: antifungal and recent Flagyl for BV  Relevant past medical, surgical, family and social history reviewed and updated as indicated. Interim medical history since our last visit reviewed. Allergies and medications reviewed and updated.  Review of Systems  Constitutional: Negative for activity change, appetite change, fatigue and fever.  Respiratory: Negative for cough, chest tightness and shortness of breath.   Cardiovascular: Negative for chest pain, palpitations and leg swelling.  Gastrointestinal: Negative for abdominal distention, abdominal pain, constipation, diarrhea, nausea and vomiting.  Genitourinary: Positive for dysuria, vaginal discharge and vaginal pain. Negative for decreased urine volume, difficulty urinating, frequency, pelvic pain and urgency.  Neurological: Negative for dizziness, syncope, weakness, light-headedness, numbness and headaches.    Psychiatric/Behavioral: Negative.     Per HPI unless specifically indicated above     Objective:    BP 122/81   Pulse 97   Temp 98.1 F (36.7 C) (Oral)   Ht 5\' 5"  (1.651 m)   Wt 100 lb (45.4 kg)   SpO2 98%   BMI 16.64 kg/m   Wt Readings from Last 3 Encounters:  04/24/18 100 lb (45.4 kg)  03/22/18 120 lb (54.4 kg)  02/21/18 105 lb (47.6 kg)    Physical Exam  Constitutional: She is oriented to person, place, and time. She appears well-developed and well-nourished.  HENT:  Head: Normocephalic.  Eyes: Pupils are equal, round, and reactive to light. Conjunctivae and EOM are normal. Right eye exhibits no discharge. Left eye exhibits no discharge.  Neck: Normal range of motion. Neck supple. No JVD present. Carotid bruit is not present. No thyromegaly present.  Cardiovascular: Normal rate, regular rhythm and normal heart sounds.  Pulmonary/Chest: Effort normal and breath sounds normal.  Abdominal: Soft. Bowel sounds are normal. Hernia confirmed negative in the right inguinal area and confirmed negative in the left inguinal area.  Genitourinary: Vagina normal. Pelvic exam was performed with patient supine. There is no rash on the right labia. There is no rash on the left labia. Cervix exhibits discharge. Cervix exhibits no motion tenderness and no friability. Right adnexum displays no mass. Left adnexum displays no mass.  Genitourinary Comments: Erythema present around cervical os.  Thick yellowish discharge noted.  Mild erythema to labia bilaterally.  Lymphadenopathy:    She has no cervical adenopathy.  Neurological: She is alert and oriented to person, place, and time.  Skin: Skin is  warm and dry.  Psychiatric: She has a normal mood and affect. Her behavior is normal. Judgment and thought content normal.  Nursing note and vitals reviewed.   Results for orders placed or performed in visit on 03/22/18  WET PREP FOR TRICH, YEAST, CLUE  Result Value Ref Range   Trichomonas Exam  Negative Negative   Yeast Exam Negative Negative   Clue Cell Exam Positive (A) Negative      Assessment & Plan:   Problem List Items Addressed This Visit      Genitourinary   Bacterial vaginosis - Primary    Urinalysis obtained showing, neg nit and 1+ leuk, few bacteria.  Wet prep + clue cells and yeast.  Flagyl 500 MG BID x 7 days sent to pharmacy.  Educated not to drink alcohol with medication and take with meal.      Relevant Medications   metroNIDAZOLE (FLAGYL) 500 MG tablet   fluconazole (DIFLUCAN) 150 MG tablet   Other Relevant Orders   UA/M w/rflx Culture, Routine   GC/Chlamydia Probe Amp   WET PREP FOR TRICH, YEAST, CLUE   Yeast infection of the vagina    Fluconazole tablet sent to pharmacy.  Recommended f/u with Dr. Malvin Johns to discuss whether Rituxan causing increase risk for yeast infection.      Relevant Medications   metroNIDAZOLE (FLAGYL) 500 MG tablet   fluconazole (DIFLUCAN) 150 MG tablet       Follow up plan: Return if symptoms worsen or fail to improve.

## 2018-04-24 NOTE — Assessment & Plan Note (Addendum)
Urinalysis obtained showing, neg nit and 1+ leuk, few bacteria.  Wet prep + clue cells and yeast.  Flagyl 500 MG BID x 7 days sent to pharmacy.  Educated not to drink alcohol with medication and take with meal.

## 2018-04-26 ENCOUNTER — Other Ambulatory Visit: Payer: Self-pay | Admitting: Nurse Practitioner

## 2018-04-26 ENCOUNTER — Telehealth: Payer: Self-pay | Admitting: Nurse Practitioner

## 2018-04-26 DIAGNOSIS — N390 Urinary tract infection, site not specified: Secondary | ICD-10-CM | POA: Insufficient documentation

## 2018-04-26 DIAGNOSIS — N3000 Acute cystitis without hematuria: Secondary | ICD-10-CM

## 2018-04-26 LAB — GC/CHLAMYDIA PROBE AMP
CHLAMYDIA, DNA PROBE: NEGATIVE
NEISSERIA GONORRHOEAE BY PCR: NEGATIVE

## 2018-04-26 MED ORDER — FLUCONAZOLE 150 MG PO TABS: 150.0000 mg | ORAL_TABLET | Freq: Once | ORAL | 0 refills | Status: AC

## 2018-04-26 MED ORDER — CIPROFLOXACIN HCL 500 MG PO TABS: 500.0000 mg | ORAL_TABLET | Freq: Every day | ORAL | 0 refills | Status: AC

## 2018-04-26 NOTE — Telephone Encounter (Signed)
Spoke to patient via telephone.  Her urine culture has returned showing >100, 000 Group B strep with recommendations for Penicillin or Ampicillin.  Patient is allergic to Penicillin drugs and Sulfa drugs.  Discussed with her that 3 days of Ciprofloxacin will be sent to pharmacy, she can take this while also continuing Flagyl for her BV infection.  Will also send in x one extra dose Fluconazole which she can take as needed if yeast infection reoccurs with abx treatment regimen.  Recommended use of probiotic yogurt or pills while on abx regimen for GI health.  She was able to verbalize back instructions provided and agrees with plan of care.  She stated appreciation for phone call.

## 2018-04-26 NOTE — Assessment & Plan Note (Signed)
Pt allergic to [facility] and Sulfa medications.  Will treat with x 3 days Ciprofloxacin.

## 2018-04-30 ENCOUNTER — Other Ambulatory Visit: Payer: Self-pay

## 2018-04-30 ENCOUNTER — Encounter: Payer: Self-pay | Admitting: Emergency Medicine

## 2018-04-30 ENCOUNTER — Inpatient Hospital Stay
Admission: EM | Admit: 2018-04-30 | Discharge: 2018-05-03 | DRG: 872 | Disposition: A | Discharge status: Home / Self Care | Payer: Medicaid Other | Attending: Internal Medicine | Admitting: Internal Medicine

## 2018-04-30 DIAGNOSIS — Z79899 Other long term (current) drug therapy: Secondary | ICD-10-CM | POA: Diagnosis not present

## 2018-04-30 DIAGNOSIS — A419 Sepsis, unspecified organism: Secondary | ICD-10-CM | POA: Diagnosis present

## 2018-04-30 DIAGNOSIS — E860 Dehydration: Secondary | ICD-10-CM | POA: Diagnosis present

## 2018-04-30 DIAGNOSIS — N3000 Acute cystitis without hematuria: Secondary | ICD-10-CM | POA: Diagnosis present

## 2018-04-30 DIAGNOSIS — E872 Acidosis: Secondary | ICD-10-CM | POA: Diagnosis present

## 2018-04-30 DIAGNOSIS — Z818 Family history of other mental and behavioral disorders: Secondary | ICD-10-CM | POA: Diagnosis not present

## 2018-04-30 DIAGNOSIS — F17210 Nicotine dependence, cigarettes, uncomplicated: Secondary | ICD-10-CM | POA: Diagnosis present

## 2018-04-30 DIAGNOSIS — G35 Multiple sclerosis: Secondary | ICD-10-CM | POA: Diagnosis present

## 2018-04-30 DIAGNOSIS — F329 Major depressive disorder, single episode, unspecified: Secondary | ICD-10-CM | POA: Diagnosis present

## 2018-04-30 DIAGNOSIS — M79602 Pain in left arm: Secondary | ICD-10-CM | POA: Diagnosis present

## 2018-04-30 DIAGNOSIS — E876 Hypokalemia: Secondary | ICD-10-CM | POA: Diagnosis present

## 2018-04-30 DIAGNOSIS — R9431 Abnormal electrocardiogram [ECG] [EKG]: Secondary | ICD-10-CM

## 2018-04-30 LAB — CBC
HCT: 44.8 % (ref 36.0–46.0)
HEMOGLOBIN: 15.6 g/dL — AB (ref 12.0–15.0)
MCH: 30.9 pg (ref 26.0–34.0)
MCHC: 34.8 g/dL (ref 30.0–36.0)
MCV: 88.7 fL (ref 80.0–100.0)
Platelets: 503 10*3/uL — ABNORMAL HIGH (ref 150–400)
RBC: 5.05 MIL/uL (ref 3.87–5.11)
RDW: 12.2 % (ref 11.5–15.5)
WBC: 14.5 10*3/uL — ABNORMAL HIGH (ref 4.0–10.5)
nRBC: 0 % (ref 0.0–0.2)

## 2018-04-30 LAB — BLOOD GAS, VENOUS
Acid-base deficit: 7.6 mmol/L — ABNORMAL HIGH (ref 0.0–2.0)
Bicarbonate: 15.7 mmol/L — ABNORMAL LOW (ref 20.0–28.0)
O2 Saturation: 88.4 %
PO2 VEN: 56 mmHg — AB (ref 32.0–45.0)
Patient temperature: 37
pCO2, Ven: 26 mmHg — ABNORMAL LOW (ref 44.0–60.0)
pH, Ven: 7.39 (ref 7.250–7.430)

## 2018-04-30 LAB — URINALYSIS, COMPLETE (UACMP) WITH MICROSCOPIC
Bacteria, UA: NONE SEEN
Bilirubin Urine: NEGATIVE
Glucose, UA: 50 mg/dL — AB
Ketones, ur: 20 mg/dL — AB
Nitrite: NEGATIVE
Protein, ur: NEGATIVE mg/dL
Specific Gravity, Urine: 1.014 (ref 1.005–1.030)
pH: 6 (ref 5.0–8.0)

## 2018-04-30 LAB — LACTIC ACID, PLASMA
Lactic Acid, Venous: 7.6 mmol/L (ref 0.5–1.9)
Lactic Acid, Venous: 2.6 mmol/L (ref 0.5–1.9)
Lactic Acid, Venous: 2.1 mmol/L (ref 0.5–1.9)

## 2018-04-30 LAB — BASIC METABOLIC PANEL
Anion gap: 19 — ABNORMAL HIGH (ref 5–15)
Anion gap: 9 (ref 5–15)
BUN: 8 mg/dL (ref 6–20)
BUN: 6 mg/dL (ref 6–20)
CHLORIDE: 114 mmol/L — AB (ref 98–111)
CO2: 11 mmol/L — ABNORMAL LOW (ref 22–32)
CO2: 15 mmol/L — ABNORMAL LOW (ref 22–32)
Calcium: 9.5 mg/dL (ref 8.9–10.3)
Calcium: 7.3 mg/dL — ABNORMAL LOW (ref 8.9–10.3)
Chloride: 107 mmol/L (ref 98–111)
Creatinine, Ser: 0.84 mg/dL (ref 0.44–1.00)
Creatinine, Ser: 0.61 mg/dL (ref 0.44–1.00)
GFR calc Af Amer: >60 mL/min (ref >60)
GFR calc Af Amer: >60 mL/min (ref >60)
GFR calc non Af Amer: >60 mL/min (ref >60)
Glucose, Bld: 197 mg/dL — ABNORMAL HIGH (ref 70–99)
Glucose, Bld: 113 mg/dL — ABNORMAL HIGH (ref 70–99)
POTASSIUM: 2.3 mmol/L — AB (ref 3.5–5.1)
Potassium: 3.2 mmol/L — ABNORMAL LOW (ref 3.5–5.1)
Sodium: 137 mmol/L (ref 135–145)
Sodium: 138 mmol/L (ref 135–145)

## 2018-04-30 LAB — POTASSIUM: Potassium: 4 mmol/L (ref 3.5–5.1)

## 2018-04-30 LAB — MAGNESIUM: Magnesium: 1.7 mg/dL (ref 1.7–2.4)

## 2018-04-30 LAB — PHOSPHORUS: Phosphorus: 2.2 mg/dL — ABNORMAL LOW (ref 2.5–4.6)

## 2018-04-30 LAB — PREGNANCY, URINE: Preg Test, Ur: NEGATIVE

## 2018-04-30 LAB — ALBUMIN: Albumin: 3.9 g/dL (ref 3.5–5.0)

## 2018-04-30 MED ORDER — SODIUM CHLORIDE 0.9 % IV SOLN
1.0000 g | INTRAVENOUS | Status: DC
  Administered 2018-05-01 – 2018-05-03 (×3): 1 g via INTRAVENOUS
  Filled 2018-04-30 (×3): qty 1

## 2018-04-30 MED ORDER — DIPHENHYDRAMINE HCL 50 MG/ML IJ SOLN
12.5000 mg | Freq: Once | INTRAMUSCULAR | Status: AC
  Administered 2018-04-30: 12.5 mg via INTRAVENOUS
  Filled 2018-04-30: qty 1

## 2018-04-30 MED ORDER — ENOXAPARIN SODIUM 40 MG/0.4ML ~~LOC~~ SOLN: 40.0000 mg | SUBCUTANEOUS | Status: DC

## 2018-04-30 MED ORDER — ACETAMINOPHEN 325 MG PO TABS
650.0000 mg | ORAL_TABLET | Freq: Four times a day (QID) | ORAL | Status: DC | PRN
  Administered 2018-05-01: 650 mg via ORAL
  Filled 2018-04-30 (×2): qty 2

## 2018-04-30 MED ORDER — SODIUM BICARBONATE 8.4 % IV SOLN
INTRAVENOUS | Status: DC
  Administered 2018-04-30 – 2018-05-01 (×4): via INTRAVENOUS
  Filled 2018-04-30 (×3): qty 150

## 2018-04-30 MED ORDER — DIPHENHYDRAMINE HCL 50 MG/ML IJ SOLN
12.5000 mg | Freq: Three times a day (TID) | INTRAMUSCULAR | Status: DC | PRN
  Administered 2018-04-30 – 2018-05-02 (×4): 12.5 mg via INTRAVENOUS
  Filled 2018-04-30 (×6): qty 0.25

## 2018-04-30 MED ORDER — KETOROLAC TROMETHAMINE 15 MG/ML IJ SOLN
15.0000 mg | Freq: Four times a day (QID) | INTRAMUSCULAR | Status: DC | PRN
  Administered 2018-05-01: 15 mg via INTRAVENOUS
  Filled 2018-04-30 (×2): qty 1

## 2018-04-30 MED ORDER — SODIUM CHLORIDE 0.9 % IV BOLUS
1000.0000 mL | Freq: Once | INTRAVENOUS | Status: AC
  Administered 2018-04-30: 1000 mL via INTRAVENOUS

## 2018-04-30 MED ORDER — VITAMIN D 25 MCG (1000 UNIT) PO TABS
1000.0000 [IU] | ORAL_TABLET | Freq: Every day | ORAL | Status: DC
  Administered 2018-04-30 – 2018-05-03 (×4): 1000 [IU] via ORAL
  Filled 2018-04-30 (×4): qty 1

## 2018-04-30 MED ORDER — MAGNESIUM SULFATE 2 GM/50ML IV SOLN
2.0000 g | Freq: Once | INTRAVENOUS | Status: AC
  Administered 2018-04-30: 2 g via INTRAVENOUS
  Filled 2018-04-30: qty 50

## 2018-04-30 MED ORDER — SODIUM CHLORIDE 0.9 % IV BOLUS
500.0000 mL | Freq: Once | INTRAVENOUS | Status: AC
  Administered 2018-04-30: 500 mL via INTRAVENOUS

## 2018-04-30 MED ORDER — POTASSIUM CHLORIDE 10 MEQ/100ML IV SOLN
10.0000 meq | INTRAVENOUS | Status: AC
  Administered 2018-04-30 (×4): 10 meq via INTRAVENOUS
  Filled 2018-04-30 (×4): qty 100

## 2018-04-30 MED ORDER — K PHOS MONO-SOD PHOS DI & MONO 155-852-130 MG PO TABS
500.0000 mg | ORAL_TABLET | ORAL | Status: DC
  Administered 2018-04-30 – 2018-05-01 (×3): 500 mg via ORAL
  Filled 2018-04-30 (×5): qty 2

## 2018-04-30 MED ORDER — CITALOPRAM HYDROBROMIDE 20 MG PO TABS: 40.0000 mg | ORAL_TABLET | Freq: Every day | ORAL | Status: DC

## 2018-04-30 MED ORDER — POTASSIUM CHLORIDE CRYS ER 20 MEQ PO TBCR
40.0000 meq | EXTENDED_RELEASE_TABLET | Freq: Once | ORAL | Status: DC
  Filled 2018-04-30: qty 2

## 2018-04-30 MED ORDER — ONDANSETRON HCL 4 MG/2ML IJ SOLN
4.0000 mg | Freq: Once | INTRAMUSCULAR | Status: AC
  Administered 2018-04-30: 4 mg via INTRAVENOUS
  Filled 2018-04-30: qty 2

## 2018-04-30 MED ORDER — PROCHLORPERAZINE EDISYLATE 10 MG/2ML IJ SOLN
10.0000 mg | Freq: Once | INTRAMUSCULAR | Status: AC
  Administered 2018-04-30: 10 mg via INTRAVENOUS
  Filled 2018-04-30: qty 2

## 2018-04-30 MED ORDER — ACETAMINOPHEN 650 MG RE SUPP: 650.0000 mg | Freq: Four times a day (QID) | RECTAL | Status: DC | PRN

## 2018-04-30 MED ORDER — ENOXAPARIN SODIUM 30 MG/0.3ML ~~LOC~~ SOLN
30.0000 mg | SUBCUTANEOUS | Status: DC
  Filled 2018-04-30: qty 0.3

## 2018-04-30 MED ORDER — SODIUM BICARBONATE 8.4 % IV SOLN
INTRAVENOUS | Status: DC
  Filled 2018-04-30 (×2): qty 1000

## 2018-04-30 MED ORDER — SODIUM CHLORIDE 0.9 % IV SOLN
1.0000 g | Freq: Once | INTRAVENOUS | Status: AC
  Administered 2018-04-30: 1 g via INTRAVENOUS
  Filled 2018-04-30: qty 10

## 2018-04-30 NOTE — H&P (Signed)
[facility] - [place] at [facility]   PATIENT NAME: Jennifer Watts    MR#:  [id number]  DATE OF BIRTH:  [birth year]  DATE OF ADMISSION:  04/30/2018  PRIMARY CARE PHYSICIAN: Gabriel Cirri, NP   REQUESTING/REFERRING PHYSICIAN: dr Sharma Covert  CHIEF COMPLAINT:  I am not feeling well for last few days. I was treated for by general infection. I am having nausea and vomiting. Passing out earlier. Sister in the room  HISTORY OF PRESENT ILLNESS:  Jennifer Watts  is a 22 y.o. female with a known history of multiple sclerosis follows up with Dr. Malvin Johns anxiety/depression comes to the emergency room with nausea vomiting not feeling well unable to keep anything orally for last few days. Patient was diagnosed with bacterial vaginosis by primary care physician and was treated with PO Flagyl she started having nausea and vomiting. Denies any abdominal pain or fever at home. She felt like passing out in the triage area.  Patient was found to have UTI tachycardia and possible sepsis. Found to have lactic acid of 7.6. She received IV fluids and IV Rocephin.  EKG noted to have tachycardia with prolonged QT interval. Previous old EKG had normal QTC interval.  pt is being admitted with sepsis due to UTI, dehydration and's acidosis.  PAST MEDICAL HISTORY:   Past Medical History:  Diagnosis Date  . Anxiety   . Depression   . MS (multiple sclerosis) (HCC)   . Neuromuscular disorder (HCC)     PAST SURGICAL HISTOIRY:   Past Surgical History:  Procedure Laterality Date  . TYMPANOSTOMY TUBE PLACEMENT      SOCIAL HISTORY:   Social History   Tobacco Use  . Smoking status: Current Every Day Smoker    Packs/day: 0.50    Types: Cigarettes  . Smokeless tobacco: Never Used  Substance Use Topics  . Alcohol use: No    FAMILY HISTORY:   Family History  Problem Relation Age of Onset  . Hypertension Maternal Grandmother   . Cancer Paternal Grandfather        throat  .  Multiple sclerosis Paternal Grandfather   . Anxiety disorder Mother     DRUG ALLERGIES:   Allergies  Allergen Reactions  . Reglan [Metoclopramide] Other (See Comments)    Hyperactivity /becomes mean  . Ibuprofen Rash  . Penicillins Rash    Has patient had a PCN reaction causing immediate rash, facial/tongue/throat swelling, SOB or lightheadedness with hypotension: No Has patient had a PCN reaction causing severe rash involving mucus membranes or skin necrosis: No Has patient had a PCN reaction that required hospitalization: No Has patient had a PCN reaction occurring within the last 10 years: No If all of the above answers are "NO", then may proceed with Cephalosporin use.   . Sulfa Antibiotics Rash  . Sulfasalazine Rash    REVIEW OF SYSTEMS:  ROS   MEDICATIONS AT HOME:   Prior to Admission medications   Medication Sig Start Date End Date Taking? Authorizing Provider  Cholecalciferol (VITAMIN D PO) Take 1 tablet by mouth daily.   Yes [provider]  citalopram (CELEXA) 40 MG tablet TAKE 1 TABLET BY MOUTH EVERY DAY 02/22/18  Yes [provider]  Cyanocobalamin (B-12 PO) Take 1 tablet by mouth daily.   Yes [provider]  SUMAtriptan (IMITREX) 100 MG tablet Take 50-100 mg by mouth daily as needed. 01/08/17  Yes [provider]  medroxyPROGESTERone (DEPO-PROVERA) 150 MG/ML injection INJECT 1 ML (150 MG TOTAL) INTO THE  MUSCLE EVERY 3 (THREE) MONTHS. 12/11/17   Gabriel Cirri, NP  metroNIDAZOLE (FLAGYL) 500 MG tablet Take 1 tablet (500 mg total) by mouth 2 (two) times daily for 7 days. Patient not taking: Reported on 04/30/2018 04/24/18 05/01/18  Aura Dials T, NP      VITAL SIGNS:  Blood pressure (!) 135/97, pulse 92, resp. rate (!) 28, height 5\' 5"  (1.651 m), weight 45.4 kg, SpO2 100 %.  PHYSICAL EXAMINATION:  GENERAL:  22 y.o.-year-old patient lying in the bed with no acute distress. Thin frail EYES: Pupils equal, round, reactive to  light and accommodation. No scleral icterus. Extraocular muscles intact.  HEENT: Head atraumatic, normocephalic. Oropharynx and nasopharynx clear. Oral mucosa dry NECK:  Supple, no jugular venous distention. No thyroid enlargement, no tenderness.  LUNGS: Normal breath sounds bilaterally, no wheezing, rales,rhonchi or crepitation. No use of accessory muscles of respiration.  CARDIOVASCULAR: S1, S2 normal. No murmurs, rubs, or gallops. Tachycardia ABDOMEN: Soft, nontender, nondistended. Bowel sounds present. No organomegaly or mass.  EXTREMITIES: No pedal edema, cyanosis, or clubbing.  NEUROLOGIC: Cranial nerves II through XII are intact. Muscle strength 5/5 in all extremities. Sensation intact. Gait not checked.  PSYCHIATRIC: The patient is alert and oriented x 3.  SKIN: No obvious rash, lesion, or ulcer.   LABORATORY PANEL:   CBC Recent Labs  Lab 04/30/18 0747  WBC 14.5*  HGB 15.6*  HCT 44.8  PLT 503*   ------------------------------------------------------------------------------------------------------------------  Chemistries  Recent Labs  Lab 04/30/18 1022  NA 138  K 3.2*  CL 114*  CO2 15*  GLUCOSE 113*  BUN 6  CREATININE 0.61  CALCIUM 7.3*   ------------------------------------------------------------------------------------------------------------------  Cardiac Enzymes No results for input(s): TROPONINI in the last 168 hours. ------------------------------------------------------------------------------------------------------------------  RADIOLOGY:  No results found.  EKG:  tachycardia with prolonged QT interval  IMPRESSION AND PLAN:  Jennifer Watts  is a 22 y.o. female with a known history of multiple sclerosis follows up with Dr. Malvin Johns anxiety/depression comes to the emergency room with nausea vomiting not feeling well unable to keep anything orally for last few days. Patient was diagnosed with bacterial vaginosis by primary care physician and was  treated with PO Flagyl she started having nausea and vomiting. Denies any abdominal pain or fever at home. She felt like passing out in the triage area.  1. Sepsis secondary to UTI and acidosis -admit to MedSurg floor -telemonitoring -IV Rocephin -IV fluids -follow blood culture urine culture  2. Prolonged QT interval with tachycardia on EKG -cardiology consultation with Dr. Darrold Junker -patient had normal EKG the past  3. Severe acidosis secondary to nausea vomiting and dehydration along with sepsis -IV bicarb drip -monitor metabolic panel  4. Hypokalemia replete K  5. Leukocytosis due to number one  6. DVT prophylaxis subcu Lovenox  All the records are reviewed and case discussed with ED provider. Management plans discussed with the patient, family and they are in agreement.  CODE STATUS: full  TOTAL TIME TAKING CARE OF THIS PATIENT: *50* minutes.    Enedina Finner M.D on 04/30/2018 at 11:44 AM  Between 7am to 6pm - Pager - [phone]  After 6pm go to [email or web address] - password [email or web address] [facility]  [facility]  Office  [phone]  CC: Primary care physician; Gabriel Cirri, NP

## 2018-04-30 NOTE — ED Notes (Signed)
First Nurse Note: Patient ambulatory to registration states she feels like she is going to pass out.  Placed in WC.  Color pale.  Alert and oriented.

## 2018-04-30 NOTE — Progress Notes (Signed)
PHARMACY CONSULT NOTE - FOLLOW UP  Pharmacy Consult for Electrolyte Monitoring and Replacement   Recent Labs: Potassium (mmol/L)  Date Value  04/30/2018 4.0  05/09/2014 3.8   Magnesium (mg/dL)  Date Value  27/25/3664 1.7  02/26/2014 2.1   Calcium (mg/dL)  Date Value  40/34/7425 7.3 (L)   Calcium, Total (mg/dL)  Date Value  95/63/8756 9.1   Albumin (g/dL)  Date Value  43/32/9518 3.9  05/09/2014 4.6   Phosphorus (mg/dL)  Date Value  84/16/6063 2.2 (L)    Assessment: 22 year old female admitted with weakness and N/V  Goal of Therapy:  Potassium ~ 4 and magnesium ~ 2  Plan:  K - 4.0 Mg - 1.7 Phos - 2.2  Will order Magnesium Sulf 2 gm IV once. Will order KPhos Neutral 2 tablets every 4 hours for 4 doses.  Will recheck BMP, Mg, and Phos with morning labs and reassess additional replacement.  Orinda Kenner, PharmD Clinical Pharmacist 04/30/2018 4:44 PM

## 2018-04-30 NOTE — Progress Notes (Signed)
CODE SEPSIS - PHARMACY COMMUNICATION  **Broad Spectrum Antibiotics should be administered within 1 hour of Sepsis diagnosis**  Time Code Sepsis Called/Page Received: 0964  Antibiotics Ordered: (on Cipro and Metronidazole at home)                                       Ordered Ceftriaxone 1gm)  Time of 1st antibiotic administration: 0947  Additional action taken by pharmacy: called RN Pattricia Boss at [phone]  If necessary, Name of Provider/Nurse Contacted: n/a    Shontel Santee Kirtland Bouchard ,Rph Clinical Pharmacist  04/30/2018  9:16 AM

## 2018-04-30 NOTE — Progress Notes (Signed)
PHARMACIST - PHYSICIAN COMMUNICATION  CONCERNING:  Enoxaparin (Lovenox) for DVT Prophylaxis    RECOMMENDATION: Patient was prescribed enoxaprin 40mg  q24 hours for VTE prophylaxis.   Filed Weights   04/30/18 0732  Weight: 100 lb (45.4 kg)    Body mass index is 16.64 kg/m.  Estimated Creatinine Clearance: 80.4 mL/min (by C-G formula based on SCr of 0.61 mg/dL).   Patient is candidate for enoxaparin 30mg  every 24 hours based on CrCl <97ml/min or Weight less then 45kg for female and 50kg for female  DESCRIPTION: Pharmacy has adjusted enoxaparin dose per [facility] policy.  Patient is now receiving enoxaparin 30mg  every 24 hours.  Orinda Kenner, PharmD Clinical Pharmacist  04/30/2018 12:29 PM

## 2018-04-30 NOTE — ED Notes (Signed)
Date and time results received: 04/30/18 11:12 AM  (use smartphrase ".now" to insert current time)  Test: Lactic Critical Value: 2.6  Name of Provider Notified: Dr. Sharma Covert  Orders Received? Or Actions Taken?: Orders Received - See Orders for details

## 2018-04-30 NOTE — Consult Note (Signed)
[facility]  CARDIOLOGY CONSULT NOTE  Patient ID: Jennifer Watts MRN: [id number] DOB/AGE: 1996-02-24 22 y.o.  Admit date: 04/30/2018 Referring Physician [facility] Primary Physician [facility] Primary Cardiologist  Reason for Consultation prolonged QT interval  HPI: 22 year old female referred for evaluation of prolonged QT interval.  The patient has a history of multiple sclerosis.  She presents with 1 to 2-week history of anterior vaginosis, treated with p.o. Flagyl, to have urinary tract infection, lactic acidosis.  She reports she is been not feeling well, nausea and vomiting.  Labs notable for [facility] 2.3.  ECG reveals sinus tachycardia rate of 101 bpm, with QT interval 442 ms, and corrected QT interval 573 ms.  Review of systems complete and found to be negative unless listed above     Past Medical History:  Diagnosis Date  . Anxiety   . Depression   . MS (multiple sclerosis) (HCC)   . Neuromuscular disorder [facility])     Past Surgical History:  Procedure Laterality Date  . TYMPANOSTOMY TUBE PLACEMENT      Medications Prior to Admission  Medication Sig Dispense Refill Last Dose  . Cholecalciferol (VITAMIN D PO) Take 1 tablet by mouth daily.   04/29/2018 at 0800  . citalopram (CELEXA) 40 MG tablet TAKE 1 TABLET BY MOUTH EVERY DAY   04/30/2018 at 2000  . Cyanocobalamin (B-12 PO) Take 1 tablet by mouth daily.   04/29/2018 at 0800  . SUMAtriptan (IMITREX) 100 MG tablet Take 50-100 mg by mouth daily as needed.   04/30/2018 at 0700  . medroxyPROGESTERone (DEPO-PROVERA) 150 MG/ML injection INJECT 1 ML (150 MG TOTAL) INTO THE MUSCLE EVERY 3 (THREE) MONTHS. 1 mL 4 Taking  . metroNIDAZOLE (FLAGYL) 500 MG tablet Take 1 tablet (500 mg total) by mouth 2 (two) times daily for 7 days. (Patient not taking: Reported on 04/30/2018) 14 tablet 0 Not Taking at Unknown time   Social History   Socioeconomic History  . Marital status: Single    Spouse name: Not on file  . Number of children: Not on  file  . Years of education: Not on file  . Highest education level: Not on file  Occupational History  . Not on file  Social Needs  . Financial resource strain: Not on file  . Food insecurity:    Worry: Not on file    Inability: Not on file  . Transportation needs:    Medical: Not on file    Non-medical: Not on file  Tobacco Use  . Smoking status: Current Every Day Smoker    Packs/day: 0.50    Types: Cigarettes  . Smokeless tobacco: Never Used  Substance and Sexual Activity  . Alcohol use: No  . Drug use: No  . Sexual activity: Yes    Birth control/protection: Injection  Lifestyle  . Physical activity:    Days per week: Not on file    Minutes per session: Not on file  . Stress: Not on file  Relationships  . Social connections:    Talks on phone: Not on file    Gets together: Not on file    Attends religious service: Not on file    Active member of club or organization: Not on file    Attends meetings of clubs or organizations: Not on file    Relationship status: Not on file  . Intimate partner violence:    Fear of current or ex partner: Not on file    Emotionally abused: Not on file  Physically abused: Not on file    Forced sexual activity: Not on file  Other Topics Concern  . Not on file  Social History Narrative  . Not on file    Family History  Problem Relation Age of Onset  . Hypertension Maternal Grandmother   . Cancer Paternal Grandfather        throat  . Multiple sclerosis Paternal Grandfather   . Anxiety disorder Mother       Review of systems complete and found to be negative unless listed above      PHYSICAL EXAM  General: Well developed, well nourished, in no acute distress HEENT:  Normocephalic and atramatic Neck:  No JVD.  Lungs: Clear bilaterally to auscultation and percussion. Heart: HRRR . Normal S1 and S2 without gallops or murmurs.  Abdomen: Bowel sounds are positive, abdomen soft and non-tender  Msk:  Back normal, normal gait.  Normal strength and tone for age. Extremities: No clubbing, cyanosis or edema.   Neuro: Alert and oriented X 3. Psych:  Good affect, responds appropriately  Labs:   Lab Results  Component Value Date   WBC 14.5 (H) 04/30/2018   HGB 15.6 (H) 04/30/2018   HCT 44.8 04/30/2018   MCV 88.7 04/30/2018   PLT 503 (H) 04/30/2018    Recent Labs  Lab 04/30/18 1022  NA 138  K 3.2*  CL 114*  CO2 15*  BUN 6  CREATININE 0.61  CALCIUM 7.3*  GLUCOSE 113*   Lab Results  Component Value Date   TROPONINI <0.03 06/20/2017   No results found for: CHOL No results found for: HDL No results found for: LDLCALC No results found for: TRIG No results found for: CHOLHDL No results found for: LDLDIRECT    Radiology: No results found.  EKG: Sinus tachycardia with a QT 442 ms, QTC 573 ms  ASSESSMENT AND PLAN:   1.  Acquired prolonged QT interval the setting of nausea, vomiting, UTI and urosepsis with hypokalemia  Recommendations  1.  Agree with overall current therapy 2.  Replete potassium 3.  Consider repeating magnesium 4.  Avoid medications that prolong QT interval 5.  Repeat ECG in a.m.  Signed: Marcina Millard MD,PhD, [facility] 04/30/2018, 1:34 PM

## 2018-04-30 NOTE — ED Notes (Signed)
Date and time results received: 04/30/18 9:16 AM  (use smartphrase ".now" to insert current time)  Test: Lactic Critical Value: 7.6  Name of Provider Notified: Dr. Sharma Covert  Orders Received? Or Actions Taken?: Orders Received - See Orders for details

## 2018-04-30 NOTE — Progress Notes (Signed)
PHARMACY CONSULT NOTE - FOLLOW UP  Pharmacy Consult for Electrolyte Monitoring and Replacement   Recent Labs: Potassium (mmol/L)  Date Value  04/30/2018 3.2 (L)  05/09/2014 3.8   Magnesium (mg/dL)  Date Value  91/47/8295 2.0  02/26/2014 2.1   Calcium (mg/dL)  Date Value  62/13/0865 7.3 (L)   Calcium, Total (mg/dL)  Date Value  78/46/9629 9.1   Albumin (g/dL)  Date Value  52/84/1324 5.3 (H)  05/09/2014 4.6    Assessment: 22 year old female admitted with weakness and N/V  Goal of Therapy:  Potassium ~ 4 and magnesium ~ 2  Plan:  Potassium 2.3 on admission. Potassium 40 mEq PO x 1 and 10 mEq IV x 4 ordered. Patient refused PO dose. Repeat BMP with potassium 3.2, although this was drawn while patient was receiving IV potassium.  Will order potassium, magnesium and phosphorus for this afternoon to be drawn approximately 1 hour after the potassium runs should be done. Labs ordered for 1600.  Pricilla Riffle, PharmD Pharmacy Resident  04/30/2018 11:54 AM

## 2018-04-30 NOTE — ED Provider Notes (Addendum)
[facility] Emergency Department Provider Note  ____________________________________________  Time seen: Approximately 7:47 AM  I have reviewed the triage vital signs and the nursing notes.   HISTORY  Chief Complaint Weakness    HPI Jennifer Watts is a 22 y.o. female with a history of MS, anxiety and depression, currently under treatment for bacterial vaginosis and UTI, presenting for nausea and vomiting, general malaise.  The patient states that 4 days ago she was seen by her primary care office, diagnosed with BV and UTI after pelvic examination.  She was started on metronidazole and had a 3-day course of once daily ciprofloxacin.  Overnight, the patient began to develop nausea and vomiting with general malaise.  She has not had any abdominal pain, constipation or diarrhea.  She has not tried anything for her symptoms.  Past Medical History:  Diagnosis Date  . Anxiety   . Depression   . MS (multiple sclerosis) (HCC)   . Neuromuscular disorder [facility])     Patient Active Problem List   Diagnosis Date Noted  . Urinary tract infection 04/26/2018  . Bacterial vaginosis 04/24/2018  . Yeast infection of the vagina 04/24/2018  . Migraine without aura and without status migrainosus, not intractable 03/14/2017  . Panic disorder 07/08/2015  . Suicidal ideation 07/08/2015  . 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 Tobacco use disorder 07/08/2015  . Cannabis use disorder, moderate, dependence (HCC) 07/08/2015  . Dysmenorrhea in adolescent 06/02/2015  . Severe anxiety 10/28/2014  . Multiple sclerosis (HCC) 10/26/2014  . Depression 02/27/2014  . Anxiety 01/20/2014  . Vitamin D deficiency 12/29/2013    Past Surgical History:  Procedure Laterality Date  . TYMPANOSTOMY TUBE PLACEMENT      Current Outpatient Rx  . Order #: [id number] Class: Historical Med  . Order #: [id number] Class: Historical Med  . Order #:  [id number] Class: Historical Med  . Order #: [id number] Class: Historical Med  . Order #: [id number] Class: Normal  . Order #: [id number] Class: Normal    Allergies Reglan [metoclopramide]; Ibuprofen; Penicillins; Sulfa antibiotics; and Sulfasalazine  Family History  Problem Relation Age of Onset  . Hypertension Maternal Grandmother   . Cancer Paternal Grandfather        throat  . Multiple sclerosis Paternal Grandfather   . Anxiety disorder Mother     Social History Social History   Tobacco Use  . Smoking status: Current Every Day Smoker    Packs/day: 0.50    Types: Cigarettes  . Smokeless tobacco: Never Used  Substance Use Topics  . Alcohol use: No  . Drug use: No    Review of Systems Constitutional: No fever/chills.  Positive general malaise.  Positive lightheadedness without syncope.  Positive diaphoresis. Eyes: No visual changes. ENT: No sore throat. No congestion or rhinorrhea. Cardiovascular: Denies chest pain. Denies palpitations. Respiratory: Denies shortness of breath.  No cough. Gastrointestinal: No abdominal pain.  Positive +nausea, +vomiting.  No diarrhea.  No constipation. Genitourinary: Negative for dysuria.  No hematuria.  No change in vaginal discharge.  Recent treatment for UTI and BV. Musculoskeletal: Negative for back pain. Skin: Negative for rash. Neurological: Negative for headaches. No focal numbness, tingling or weakness.     ____________________________________________   PHYSICAL EXAM:  VITAL SIGNS: ED Triage Vitals  Enc Vitals Group     BP --      Pulse --      Resp --      Temp --      Temp src --  SpO2 --      Weight 04/30/18 0732 100 lb (45.4 kg)     Height 04/30/18 0732 5\' 5"  (1.651 m)     Head Circumference --      Peak Flow --      Pain Score 04/30/18 0731 10     Pain Loc --      Pain Edu? --      Excl. in GC? --     Constitutional: Alert and oriented.Answers questions appropriately.  Uncomfortable appearing. Eyes:  Conjunctivae are normal.  EOMI. No scleral icterus. Head: Atraumatic. Nose: No congestion/rhinnorhea. Mouth/Throat: Mucous membranes are moist.  Neck: No stridor.  Supple.   Cardiovascular: Normal rate, regular rhythm. No murmurs, rubs or gallops.  Respiratory: The patient is tachypneic.  No accessory muscle use or retractions. Lungs CTAB.  No wheezes, rales or ronchi. Gastrointestinal: Soft, nontender and nondistended.  No guarding or rebound.  No peritoneal signs. Musculoskeletal: No LE edema.  Neurologic:  A&Ox3.  Speech is clear.  Face and smile are symmetric.  EOMI.  Moves all extremities well. Skin:  Skin is warm, dry and intact. No rash noted. Psychiatric: Mood and affect are normal. Speech and behavior are normal.  Normal judgement.  ____________________________________________   LABS (all labs ordered are listed, but only abnormal results are displayed)  Labs Reviewed  BASIC METABOLIC PANEL - Abnormal; Notable for the following components:      Result Value   Potassium 2.3 (*)    CO2 11 (*)    Glucose, Bld 197 (*)    Anion gap 19 (*)    All other components within normal limits  CBC - Abnormal; Notable for the following components:   WBC 14.5 (*)    Hemoglobin 15.6 (*)    Platelets 503 (*)    All other components within normal limits  URINALYSIS, COMPLETE (UACMP) WITH MICROSCOPIC - Abnormal; Notable for the following components:   Color, Urine YELLOW (*)    APPearance CLEAR (*)    Glucose, UA 50 (*)    Hgb urine dipstick SMALL (*)    Ketones, ur 20 (*)    Leukocytes, UA MODERATE (*)    All other components within normal limits  LACTIC ACID, PLASMA - Abnormal; Notable for the following components:   Lactic Acid, Venous 7.6 (*)    All other components within normal limits  BLOOD GAS, VENOUS - Abnormal; Notable for the following components:   pCO2, Ven 26 (*)    pO2, Ven 56.0 (*)    Bicarbonate 15.7 (*)    Acid-base deficit 7.6 (*)    All other components within  normal limits  URINE CULTURE  CULTURE, BLOOD (ROUTINE X 2)  CULTURE, BLOOD (ROUTINE X 2)  LACTIC ACID, PLASMA  BASIC METABOLIC PANEL  CBG MONITORING, ED  POC URINE PREG, ED   ____________________________________________  EKG  ED ECG REPORT I, Anne-Caroline Sharma Covert, the attending physician, personally viewed and interpreted this ECG.   Date: 04/30/2018  EKG Time: 730  Rate: 101  Rhythm: sinus bradycardia  Axis: normal  Intervals:prolonged QTc  ST&T Change: No STEMI; abnormal ST segments II, III, AVF  This EKG is compared to 2/19 which also showed a QTC of 560 and ST segment abnormalities.  ____________________________________________  RADIOLOGY  No results found.  ____________________________________________   PROCEDURES  Procedure(s) performed: None  Procedures  Critical Care performed: Yes, see critical care note(s) ____________________________________________   INITIAL IMPRESSION / ASSESSMENT AND PLAN / ED COURSE  Pertinent labs &  imaging results that were available during my care of the patient were reviewed by me and considered in my medical decision making (see chart for details).  22 y.o. female with a history of MS currently under treatment for bacterial vaginosis and UTI presenting with general malaise, nausea and vomiting, tachycardic.  Overall, I am concerned that the patient has an undertreated infection.  I am concerned that she only had once daily Cipro dosing, for only 3 days.  In addition, she has a history of prolonged QTC, and has never had this evaluated, which is concerning.  Here, we will get a UA, and the patient will receive intravenous fluids as she does appear to be dehydrated.  In addition, will rule out pregnancy, and check her electrolytes.  The patient will be reevaluated for final disposition.  ----------------------------------------- 9:13 AM on 04/30/2018 -----------------------------------------  The patient continues to have  reassuring vital signs, but began to feel poorly after Compazine, as I did not want to re-dose her with Zofran given her QT prolongation.  I have added Benadryl to her medications.  She does have multiple significant abnormalities including a potassium of less than 3, which I have ordered supplementation for.  In addition, the patient has a lactic acid greater than 7.6.  Her VBG is pending.  She has hyperglycemia with an anion gap of 19; we will plan to treat her with aggressive fluid management and recheck both her sugar and anion gap after fluids as the patient has no history of diabetes and this is very unlikely DKA.  Rocephin has been ordered; she does have a penicillin allergy with rash, but I do not want give her fluoroquinolone given her QT prolongation.  The patient will require admission to the hospital.  ----------------------------------------- 10:08 AM on 04/30/2018 -----------------------------------------  The patient's urine does show some white blood cells and leukocyte esterase although I do not see any evidence of bacteria or nitrites.  This is likely an incompletely treated UTI.  I have admitted the patient to the hospitalist.  ____________________________________________  FINAL CLINICAL IMPRESSION(S) / ED DIAGNOSES  Final diagnoses:  Sepsis, due to unspecified organism, unspecified whether acute organ dysfunction present (HCC)  Hypokalemia  QT prolongation  Acute cystitis without hematuria         NEW MEDICATIONS STARTED DURING THIS VISIT:  New Prescriptions   No medications on file      Rockne Menghini, MD 04/30/18 0805    Rockne Menghini, MD 04/30/18 4098    Rockne Menghini, MD 04/30/18 1008

## 2018-04-30 NOTE — ED Triage Notes (Signed)
Pt arrives anxious, hyperventilating   , chest pain , N/V , diaphoretic. "I am really weak and I feel I am go pass out, being treated for vaginal bacteria, been on ABX x4 days

## 2018-05-01 LAB — POTASSIUM
Potassium: 2.9 mmol/L — ABNORMAL LOW (ref 3.5–5.1)
Potassium: 3.9 mmol/L (ref 3.5–5.1)

## 2018-05-01 LAB — GASTROINTESTINAL PANEL BY PCR, STOOL (REPLACES STOOL CULTURE)
Adenovirus F40/41: NOT DETECTED
Astrovirus: NOT DETECTED
CRYPTOSPORIDIUM: NOT DETECTED
Campylobacter species: NOT DETECTED
Cyclospora cayetanensis: NOT DETECTED
ENTEROAGGREGATIVE E COLI (EAEC): NOT DETECTED
Entamoeba histolytica: NOT DETECTED
Enteropathogenic E coli (EPEC): NOT DETECTED
Enterotoxigenic E coli (ETEC): NOT DETECTED
Giardia lamblia: NOT DETECTED
Norovirus GI/GII: NOT DETECTED
Plesimonas shigelloides: NOT DETECTED
Rotavirus A: NOT DETECTED
SALMONELLA SPECIES: NOT DETECTED
Sapovirus (I, II, IV, and V): NOT DETECTED
Shiga like toxin producing E coli (STEC): NOT DETECTED
Shigella/Enteroinvasive E coli (EIEC): NOT DETECTED
Vibrio cholerae: NOT DETECTED
Vibrio species: NOT DETECTED
Yersinia enterocolitica: NOT DETECTED

## 2018-05-01 LAB — URINE DRUG SCREEN, QUALITATIVE (ARMC ONLY)
Amphetamines, Ur Screen: NOT DETECTED
Barbiturates, Ur Screen: NOT DETECTED
Benzodiazepine, Ur Scrn: NOT DETECTED
Cannabinoid 50 Ng, Ur ~~LOC~~: POSITIVE — AB
Cocaine Metabolite,Ur ~~LOC~~: NOT DETECTED
MDMA (Ecstasy)Ur Screen: NOT DETECTED
Methadone Scn, Ur: NOT DETECTED
Opiate, Ur Screen: NOT DETECTED
Phencyclidine (PCP) Ur S: NOT DETECTED
Tricyclic, Ur Screen: NOT DETECTED

## 2018-05-01 LAB — BASIC METABOLIC PANEL
Anion gap: 9 (ref 5–15)
BUN: <5 mg/dL — ABNORMAL LOW (ref 6–20)
CHLORIDE: 106 mmol/L (ref 98–111)
CO2: 24 mmol/L (ref 22–32)
Calcium: 8.5 mg/dL — ABNORMAL LOW (ref 8.9–10.3)
Creatinine, Ser: 0.52 mg/dL (ref 0.44–1.00)
GFR calc Af Amer: >60 mL/min (ref >60)
GFR calc non Af Amer: >60 mL/min (ref >60)
Glucose, Bld: 104 mg/dL — ABNORMAL HIGH (ref 70–99)
Potassium: 2.8 mmol/L — ABNORMAL LOW (ref 3.5–5.1)
SODIUM: 139 mmol/L (ref 135–145)

## 2018-05-01 LAB — URINE CULTURE: Culture: 80000 — AB

## 2018-05-01 LAB — MAGNESIUM: Magnesium: 2.5 mg/dL — ABNORMAL HIGH (ref 1.7–2.4)

## 2018-05-01 LAB — PHOSPHORUS: Phosphorus: 3.8 mg/dL (ref 2.5–4.6)

## 2018-05-01 MED ORDER — ONDANSETRON HCL 4 MG/2ML IJ SOLN
4.0000 mg | Freq: Four times a day (QID) | INTRAMUSCULAR | Status: DC | PRN
  Administered 2018-05-01 (×2): 4 mg via INTRAVENOUS
  Filled 2018-05-01 (×2): qty 2

## 2018-05-01 MED ORDER — POTASSIUM CHLORIDE IN NACL 20-0.9 MEQ/L-% IV SOLN
INTRAVENOUS | Status: DC
  Administered 2018-05-01: 09:00:00 via INTRAVENOUS
  Filled 2018-05-01 (×2): qty 1000

## 2018-05-01 MED ORDER — PROMETHAZINE HCL 25 MG/ML IJ SOLN
12.5000 mg | Freq: Once | INTRAMUSCULAR | Status: AC
  Administered 2018-05-01: 10:00:00 12.5 mg via INTRAVENOUS
  Filled 2018-05-01: qty 1

## 2018-05-01 MED ORDER — POTASSIUM CHLORIDE IN NACL 40-0.9 MEQ/L-% IV SOLN
INTRAVENOUS | Status: DC
  Administered 2018-05-01 – 2018-05-02 (×2): 75 mL/h via INTRAVENOUS
  Filled 2018-05-01 (×2): qty 1000

## 2018-05-01 MED ORDER — PROMETHAZINE HCL 25 MG/ML IJ SOLN
12.5000 mg | Freq: Four times a day (QID) | INTRAMUSCULAR | Status: DC | PRN
  Administered 2018-05-01 – 2018-05-03 (×6): 12.5 mg via INTRAVENOUS
  Filled 2018-05-01 (×6): qty 1

## 2018-05-01 MED ORDER — PROMETHAZINE HCL 12.5 MG PO TABS
6.2500 mg | ORAL_TABLET | Freq: Once | ORAL | Status: DC
  Filled 2018-05-01 (×2): qty 1

## 2018-05-01 MED ORDER — POTASSIUM CHLORIDE CRYS ER 20 MEQ PO TBCR
40.0000 meq | EXTENDED_RELEASE_TABLET | Freq: Three times a day (TID) | ORAL | Status: DC
  Administered 2018-05-01 (×2): 40 meq via ORAL
  Filled 2018-05-01 (×3): qty 2

## 2018-05-01 MED ORDER — PROMETHAZINE HCL 6.25 MG/5ML PO SYRP
6.2500 mg | ORAL_SOLUTION | Freq: Once | ORAL | Status: AC
  Administered 2018-05-01: 21:00:00 6.25 mg via ORAL
  Filled 2018-05-01: qty 5

## 2018-05-01 MED ORDER — CITALOPRAM HYDROBROMIDE 20 MG PO TABS
40.0000 mg | ORAL_TABLET | Freq: Every day | ORAL | Status: DC
  Administered 2018-05-01 – 2018-05-02 (×2): 40 mg via ORAL
  Filled 2018-05-01 (×2): qty 2

## 2018-05-01 MED ORDER — ALUM & MAG HYDROXIDE-SIMETH 200-200-20 MG/5ML PO SUSP
30.0000 mL | ORAL | Status: DC | PRN
  Administered 2018-05-01: 21:00:00 30 mL via ORAL
  Filled 2018-05-01: qty 30

## 2018-05-01 NOTE — Progress Notes (Addendum)
PHARMACY CONSULT NOTE - FOLLOW UP  Pharmacy Consult for Electrolyte Monitoring and Replacement   Recent Labs: Potassium (mmol/L)  Date Value  05/01/2018 2.8 (L)  05/09/2014 3.8   Magnesium (mg/dL)  Date Value  16/02/9603 2.5 (H)  02/26/2014 2.1   Calcium (mg/dL)  Date Value  54/01/8118 8.5 (L)   Calcium, Total (mg/dL)  Date Value  14/78/2956 9.1   Albumin (g/dL)  Date Value  21/30/8657 3.9  05/09/2014 4.6   Phosphorus (mg/dL)  Date Value  84/69/6295 3.8    Assessment: 22 year old female admitted with weakness and N/V. Pharmacy consulted for electrolyte monitoring and replacement.   Goal of Therapy:  Potassium ~ 4 and magnesium ~ 2  Plan:  K - 2.8 Mg - 2.5 Phos - 3.5  MD has already ordered KCL x 3 doses. Patient is now receiving NS w/15mEq @ 46mL/hr.   No additional replacement needed at this time. Will order F/U lab @ 2000 and continue to replace as needed. .  Will recheck BMP with morning labs. Gardner Candle, PharmD, BCPS Clinical Pharmacist 05/01/2018 8:29 AM

## 2018-05-01 NOTE — Progress Notes (Signed)
Patient ID: Jennifer Watts, female   DOB: 11-Sep-1995, 22 y.o.   MRN: [id number]  [facility] PROGRESS NOTE  PEARLINE WINTERFELD QQI:[id number] DOB: 11/28/1995 DOA: 04/30/2018 PCP: Gabriel Cirri, NP  HPI/Subjective: Patient states that she still had some nausea vomiting this morning but wants to try solid food today.  Patient did have some diarrhea yesterday.  Slight abdominal pain.  Objective: Vitals:   04/30/18 2037 05/01/18 0419  BP: 114/76 113/83  Pulse: 93 83  Resp: 18 18  Temp: 98.7 F (37.1 C) 98.9 F (37.2 C)  SpO2: 99% 99%    Filed Weights   04/30/18 0732  Weight: 45.4 kg    ROS: Review of Systems  Constitutional: Negative for chills and fever.  Eyes: Negative for blurred vision.  Respiratory: Negative for cough and shortness of breath.   Cardiovascular: Negative for chest pain.  Gastrointestinal: Positive for abdominal pain, diarrhea, nausea and vomiting. Negative for constipation.  Genitourinary: Negative for dysuria.  Musculoskeletal: Negative for joint pain.  Neurological: Negative for dizziness and headaches.   Exam: Physical Exam  Constitutional: She is oriented to person, place, and time.  HENT:  Nose: No mucosal edema.  Mouth/Throat: No oropharyngeal exudate or posterior oropharyngeal edema.  Eyes: Pupils are equal, round, and reactive to light. Conjunctivae, EOM and lids are normal.  Neck: No JVD present. Carotid bruit is not present. No edema present. No thyroid mass and no thyromegaly present.  Cardiovascular: S1 normal and S2 normal. Exam reveals no gallop.  No murmur heard. Pulses:      Dorsalis pedis pulses are 2+ on the right side and 2+ on the left side.  Respiratory: No respiratory distress. She has no wheezes. She has no rhonchi. She has no rales.  GI: Soft. Bowel sounds are normal. There is abdominal tenderness in the epigastric area.  Musculoskeletal:     Right ankle: She exhibits no swelling.     Left ankle: She exhibits no swelling.   Lymphadenopathy:    She has no cervical adenopathy.  Neurological: She is alert and oriented to person, place, and time. No cranial nerve deficit.  Skin: Skin is warm. No rash noted. Nails show no clubbing.  Psychiatric: She has a normal mood and affect.      Data Reviewed: Basic Metabolic Panel: Recent Labs  Lab 04/30/18 0747 04/30/18 1022 04/30/18 1548 05/01/18 0434  NA 137 138  --  139  K 2.3* 3.2* 4.0 2.8*  CL 107 114*  --  106  CO2 11* 15*  --  24  GLUCOSE 197* 113*  --  104*  BUN 8 6  --  <5*  CREATININE 0.84 0.61  --  0.52  CALCIUM 9.5 7.3*  --  8.5*  MG  --   --  1.7 2.5*  PHOS  --   --  2.2* 3.8   Liver Function Tests: Recent Labs  Lab 04/30/18 1022  ALBUMIN 3.9   CBC: Recent Labs  Lab 04/30/18 0747  WBC 14.5*  HGB 15.6*  HCT 44.8  MCV 88.7  PLT 503*     Recent Results (from the past 240 hour(s))  Microscopic Examination     Status: None   Collection Time: 04/24/18  9:54 AM  Result Value Ref Range Status   WBC, UA 6-10W 0 - 5 /hpf Final   RBC, UA 0-2 0 - 2 /hpf Final   Epithelial Cells (non renal) 0-10 0 - 10 /hpf Final   Renal Epithel, UA 0-10RE None  seen /hpf Final   Mucus, UA PRES Not Estab. Final   Bacteria, UA URFEWB None seen/Few Final   Yeast, UA NOSEE None seen Final  Urine Culture, Reflex     Status: Abnormal   Collection Time: 04/24/18  9:54 AM  Result Value Ref Range Status   Urine Culture, Routine Final report (A)  Final   Organism ID, Bacteria Comment (A)  Final    Comment: Beta hemolytic Streptococcus, group B Greater than 100,000 colony forming units per mL Penicillin and ampicillin are drugs of choice for treatment of beta-hemolytic streptococcal infections. Susceptibility testing of penicillins and other beta-lactam agents approved by the FDA for treatment of beta-hemolytic streptococcal infections need not be performed routinely because nonsusceptible isolates are extremely rare in any beta-hemolytic streptococcus and  have not been reported for Streptococcus pyogenes (group A). (CLSI)   WET PREP FOR TRICH, YEAST, CLUE     Status: Abnormal   Collection Time: 04/24/18 10:18 AM  Result Value Ref Range Status   Trichomonas Exam Negative Negative Final   Yeast Exam Positive (A) Negative Final   Clue Cell Exam Positive (A) Negative Final  GC/Chlamydia Probe Amp     Status: None   Collection Time: 04/24/18 10:20 AM  Result Value Ref Range Status   Chlamydia trachomatis, NAA Negative Negative Final   Neisseria gonorrhoeae by PCR Negative Negative Final  Urine culture     Status: None (Preliminary result)   Collection Time: 04/30/18  8:08 AM  Result Value Ref Range Status   Specimen Description   Final    URINE, RANDOM Performed at [facility], [address], [place], [place] [address]    Special Requests   Final    NONE Performed at [facility], [address] [place], [place] [address]    Culture   Final    CULTURE REINCUBATED FOR BETTER GROWTH Performed at [facility], [address], [place], [place] [address]    Report Status PENDING  Incomplete  Blood culture (routine x 2)     Status: None (Preliminary result)   Collection Time: 04/30/18  8:08 AM  Result Value Ref Range Status   Specimen Description BLOOD RIGHT ANTECUBITAL  Final   Special Requests   Final    BOTTLES DRAWN AEROBIC AND ANAEROBIC Blood Culture results may not be optimal due to an excessive volume of blood received in culture bottles   Culture   Final    NO GROWTH < 24 HOURS Performed at [facility], [address], [place], [place] [address]    Report Status PENDING  Incomplete  Blood culture (routine x 2)     Status: None (Preliminary result)   Collection Time: 04/30/18  8:09 AM  Result Value Ref Range Status   Specimen Description BLOOD LEFT ANTECUBITAL  Final   Special Requests   Final    BOTTLES DRAWN AEROBIC AND ANAEROBIC Blood Culture results may not be optimal due to an  excessive volume of blood received in culture bottles   Culture   Final    NO GROWTH < 24 HOURS Performed at [facility], [address] [place], [place] [address]    Report Status PENDING  Incomplete      Scheduled Meds: . cholecalciferol  1,000 Units Oral Daily  . citalopram  40 mg Oral QHS  . enoxaparin (LOVENOX) injection  30 mg Subcutaneous Q24H  . potassium chloride  40 mEq Oral Once  . potassium chloride  40 mEq Oral TID   Continuous Infusions: . 0.9 % NaCl with KCl 20 mEq / L 75 mL/hr at 05/01/18 0923  . cefTRIAXone (ROCEPHIN)  IV 1 g (05/01/18 0924)    Assessment/Plan:  1. Clinical sepsis with metabolic acidosis and acute cystitis.  On IV Rocephin.  Urine culture still pending.  So far blood cultures are negative. 2. Persistent nausea vomiting diarrhea.  Send off stool studies.  Looking back at prior urine toxicology use cannabis was positive previously.  Will send off urine toxicology. 3. Hypokalemia.  Replace potassium and IV fluids and orally 4. Prolonged QT interval with tachycardia.  Continue to monitor at this point.  Cardiology recommended replacing electrolytes 5. Depression on Celexa  Code Status:     Code Status Orders  (From admission, onward)         Start     Ordered   04/30/18 1151  Full code  Continuous     04/30/18 1150        Code Status History    Date Active Date Inactive Code Status Order ID Comments User Context   03/28/2017 1151 03/29/2017 1638 Full Code [id number]  Alford Highland, MD ED   07/08/2015 1616 07/10/2015 1635 Full Code [id number]  Clapacs, Jackquline Denmark, MD Inpatient     Disposition Plan: Reevaluate this afternoon and recheck potassium.  Consultants:  Cardiology  Antibiotics:  Rocephin  Time spent: 30 minutes  Redell Nazir [employer]

## 2018-05-01 NOTE — Progress Notes (Signed)
PHARMACY CONSULT NOTE - FOLLOW UP  Pharmacy Consult for Electrolyte Monitoring and Replacement   Recent Labs: Potassium (mmol/L)  Date Value  05/01/2018 3.9  05/09/2014 3.8   Magnesium (mg/dL)  Date Value  16/02/9603 2.5 (H)  02/26/2014 2.1   Calcium (mg/dL)  Date Value  54/01/8118 8.5 (L)   Calcium, Total (mg/dL)  Date Value  14/78/2956 9.1   Albumin (g/dL)  Date Value  21/30/8657 3.9  05/09/2014 4.6   Phosphorus (mg/dL)  Date Value  84/69/6295 3.8    Assessment: 22 year old female admitted with weakness and N/V. Pharmacy consulted for electrolyte monitoring and replacement.   Goal of Therapy:  Potassium ~ 4 and magnesium ~ 2  Plan:  K - 2.8 Mg - 2.5 Phos - 3.5  MD has already ordered KCL x 3 doses. Patient is now receiving NS w/68mEq @ 52mL/hr.   No additional replacement needed at this time. Will order F/U lab @ 2000 and continue to replace as needed.   12/18:  K @ 20:48 = 3.9 No additional K supplementation needed at this time.   Will recheck BMP with morning labs. Princella Ion D, PharmD Clinical Pharmacist 05/01/2018 9:41 PM

## 2018-05-02 LAB — BASIC METABOLIC PANEL
Anion gap: 6 (ref 5–15)
BUN: <5 mg/dL — ABNORMAL LOW (ref 6–20)
CO2: 18 mmol/L — AB (ref 22–32)
Calcium: 9.1 mg/dL (ref 8.9–10.3)
Chloride: 114 mmol/L — ABNORMAL HIGH (ref 98–111)
Creatinine, Ser: 0.6 mg/dL (ref 0.44–1.00)
GFR calc Af Amer: >60 mL/min (ref >60)
GFR calc non Af Amer: >60 mL/min (ref >60)
Glucose, Bld: 92 mg/dL (ref 70–99)
Potassium: 4.5 mmol/L (ref 3.5–5.1)
Sodium: 138 mmol/L (ref 135–145)

## 2018-05-02 MED ORDER — DRONABINOL 2.5 MG PO CAPS
2.5000 mg | ORAL_CAPSULE | Freq: Once | ORAL | Status: AC
  Administered 2018-05-02: 2.5 mg via ORAL
  Filled 2018-05-02: qty 1

## 2018-05-02 MED ORDER — PROMETHAZINE HCL 12.5 MG PO TABS: 12.5000 mg | ORAL_TABLET | Freq: Four times a day (QID) | ORAL | 0 refills | Status: DC | PRN

## 2018-05-02 MED ORDER — DEXAMETHASONE SODIUM PHOSPHATE 10 MG/ML IJ SOLN
4.0000 mg | Freq: Once | INTRAMUSCULAR | Status: AC
  Administered 2018-05-02: 4 mg via INTRAVENOUS
  Filled 2018-05-02: qty 1

## 2018-05-02 MED ORDER — ONDANSETRON HCL 4 MG/2ML IJ SOLN
4.0000 mg | Freq: Once | INTRAMUSCULAR | Status: AC
  Administered 2018-05-02: 4 mg via INTRAVENOUS
  Filled 2018-05-02: qty 2

## 2018-05-02 MED ORDER — CEPHALEXIN 250 MG PO CAPS: 250.0000 mg | ORAL_CAPSULE | Freq: Three times a day (TID) | ORAL | 0 refills | Status: AC

## 2018-05-02 MED ORDER — LORAZEPAM 0.5 MG PO TABS
0.5000 mg | ORAL_TABLET | Freq: Once | ORAL | Status: AC
  Administered 2018-05-02: 16:00:00 0.5 mg via ORAL
  Filled 2018-05-02: qty 1

## 2018-05-02 NOTE — Discharge Instructions (Signed)
Soft bland diet for 2-3 days and then advance

## 2018-05-02 NOTE — Progress Notes (Signed)
PHARMACY CONSULT NOTE - FOLLOW UP  Pharmacy Consult for Electrolyte Monitoring and Replacement   Recent Labs: Potassium (mmol/L)  Date Value  05/02/2018 4.5  05/09/2014 3.8   Magnesium (mg/dL)  Date Value  56/86/1683 2.5 (H)  02/26/2014 2.1   Calcium (mg/dL)  Date Value  72/90/2111 9.1   Calcium, Total (mg/dL)  Date Value  55/20/8022 9.1   Albumin (g/dL)  Date Value  33/61/2244 3.9  05/09/2014 4.6   Phosphorus (mg/dL)  Date Value  97/53/0051 3.8    Assessment: 22 year old female admitted with weakness and N/V. Pharmacy consulted for electrolyte monitoring and replacement.   Goal of Therapy:  Potassium ~ 4 and magnesium ~ 2  Plan:  K - 2.8 Mg - 2.5 Phos - 3.5  12/19 0400 K 4.5 will d/c po potassium for now, no replacement needed at this time will continue to monitor.  Thomasene Ripple, PharmD Clinical Pharmacist 05/02/2018 5:49 AM

## 2018-05-03 ENCOUNTER — Encounter: Payer: Self-pay | Admitting: Emergency Medicine

## 2018-05-03 ENCOUNTER — Emergency Department
Admission: EM | Admit: 2018-05-03 | Discharge: 2018-05-03 | Disposition: A | Discharge status: Home / Self Care | Payer: Medicaid Other | Attending: Emergency Medicine | Admitting: Emergency Medicine

## 2018-05-03 ENCOUNTER — Other Ambulatory Visit: Payer: Self-pay

## 2018-05-03 DIAGNOSIS — F17210 Nicotine dependence, cigarettes, uncomplicated: Secondary | ICD-10-CM | POA: Insufficient documentation

## 2018-05-03 DIAGNOSIS — M79602 Pain in left arm: Secondary | ICD-10-CM | POA: Insufficient documentation

## 2018-05-03 DIAGNOSIS — Z79899 Other long term (current) drug therapy: Secondary | ICD-10-CM | POA: Insufficient documentation

## 2018-05-03 MED ORDER — SODIUM CHLORIDE 0.9 % IV SOLN
INTRAVENOUS | Status: DC | PRN
  Administered 2018-05-03: 250 mL via INTRAVENOUS

## 2018-05-03 MED ORDER — OMEPRAZOLE 40 MG PO CPDR: 40.0000 mg | DELAYED_RELEASE_CAPSULE | Freq: Every day | ORAL | 0 refills | Status: DC

## 2018-05-03 MED ORDER — FAMOTIDINE 20 MG PO TABS: 20.0000 mg | ORAL_TABLET | Freq: Two times a day (BID) | ORAL | 1 refills | Status: DC

## 2018-05-03 NOTE — Care Management Note (Signed)
Case Management Note  Patient Details  Name: Jennifer Watts MRN: [id number] Date of Birth: 1996-04-11  Subjective/Objective:                  Patient's  mother helps her with her meds.   She has family planning medicaid.  Is not disabled even though she has had MS since she was 18. Is followed by [facility] and prefers to remain under that practice.   Action/Plan:  Provided patient with Good Rx coupons for Prilosec, Pepcid and keflex.  Patient uses [facility].  Discussed that Prilosec and Pepcid can be bought over the counter. Provided patient with [facility] applications.   Expected Discharge Date:  05/03/18               Expected Discharge Plan:     In-House Referral:     Discharge planning Services     Post Acute Care Choice:    Choice offered to:     DME Arranged:    DME Agency:     HH Arranged:    HH Agency:     Status of Service:     If discussed at [employer], dates discussed:    Additional Comments:  Eber Hong, RN 05/03/2018, 10:42 AM

## 2018-05-03 NOTE — ED Triage Notes (Signed)
Pt arrives POV and ambulatory to triage with left arm swelling where her IV was removed earlier today. Pt has small amount of swelling noted to left arm. Pt denies injury or trauma and is in NAD.

## 2018-05-03 NOTE — Progress Notes (Signed)
Patient ID: Jennifer Watts, female   DOB: [birth year], 22 y.o.   MRN: [id number]  [employer] PROGRESS NOTE  Jennifer Watts:[id number] DOB: 1995/08/03 DOA: 04/30/2018 PCP: Jennifer Cirri, NP  HPI/Subjective:  Still has nausea and vomiting  Objective: Vitals:   05/02/18 1451 05/02/18 1925  BP: 126/86 (!) 134/99  Pulse: 70 97  Resp:  16  Temp: 98.5 F (36.9 C) 98.6 F (37 C)  SpO2: 100% 100%    Filed Weights   04/30/18 0732  Weight: 45.4 kg    ROS: Review of Systems  Constitutional: Negative for chills and fever.  Eyes: Negative for blurred vision.  Respiratory: Negative for cough and shortness of breath.   Cardiovascular: Negative for chest pain.  Gastrointestinal: Positive for abdominal pain, diarrhea, nausea and vomiting. Negative for constipation.  Genitourinary: Negative for dysuria.  Musculoskeletal: Negative for joint pain.  Neurological: Negative for dizziness and headaches.   Exam: Physical Exam  Constitutional: She is oriented to person, place, and time.  HENT:  Nose: No mucosal edema.  Mouth/Throat: No oropharyngeal exudate or posterior oropharyngeal edema.  Eyes: Pupils are equal, round, and reactive to light. Conjunctivae, EOM and lids are normal.  Neck: No JVD present. Carotid bruit is not present. No edema present. No thyroid mass and no thyromegaly present.  Cardiovascular: S1 normal and S2 normal. Exam reveals no gallop.  No murmur heard. Pulses:      Dorsalis pedis pulses are 2+ on the right side and 2+ on the left side.  Respiratory: No respiratory distress. She has no wheezes. She has no rhonchi. She has no rales.  GI: Soft. Bowel sounds are normal. There is abdominal tenderness in the epigastric area.  Musculoskeletal:     Right ankle: She exhibits no swelling.     Left ankle: She exhibits no swelling.  Lymphadenopathy:    She has no cervical adenopathy.  Neurological: She is alert and oriented to person, place, and time. No cranial  nerve deficit.  Skin: Skin is warm. No rash noted. Nails show no clubbing.  Psychiatric: She has a normal mood and affect.      Data Reviewed: Basic Metabolic Panel: Recent Labs  Lab 04/30/18 0747 04/30/18 1022 04/30/18 1548 05/01/18 0434 05/01/18 1357 05/01/18 2048 05/02/18 0407  NA 137 138  --  139  --   --  138  K 2.3* 3.2* 4.0 2.8* 2.9* 3.9 4.5  CL 107 114*  --  106  --   --  114*  CO2 11* 15*  --  24  --   --  18*  GLUCOSE 197* 113*  --  104*  --   --  92  BUN 8 6  --  <5*  --   --  <5*  CREATININE 0.84 0.61  --  0.52  --   --  0.60  CALCIUM 9.5 7.3*  --  8.5*  --   --  9.1  MG  --   --  1.7 2.5*  --   --   --   PHOS  --   --  2.2* 3.8  --   --   --    Liver Function Tests: Recent Labs  Lab 04/30/18 1022  ALBUMIN 3.9   CBC: Recent Labs  Lab 04/30/18 0747  WBC 14.5*  HGB 15.6*  HCT 44.8  MCV 88.7  PLT 503*     Recent Results (from the past 240 hour(s))  Microscopic Examination     Status:  None   Collection Time: 04/24/18  9:54 AM  Result Value Ref Range Status   WBC, UA 6-10W 0 - 5 /hpf Final   RBC, UA 0-2 0 - 2 /hpf Final   Epithelial Cells (non renal) 0-10 0 - 10 /hpf Final   Renal Epithel, UA 0-10RE None seen /hpf Final   Mucus, UA PRES Not Estab. Final   Bacteria, UA URFEWB None seen/Few Final   Yeast, UA NOSEE None seen Final  Urine Culture, Reflex     Status: Abnormal   Collection Time: 04/24/18  9:54 AM  Result Value Ref Range Status   Urine Culture, Routine Final report (A)  Final   Organism ID, Bacteria Comment (A)  Final    Comment: Beta hemolytic Streptococcus, group B Greater than 100,000 colony forming units per mL Penicillin and ampicillin are drugs of choice for treatment of beta-hemolytic streptococcal infections. Susceptibility testing of penicillins and other beta-lactam agents approved by the FDA for treatment of beta-hemolytic streptococcal infections need not be performed routinely because nonsusceptible isolates are  extremely rare in any beta-hemolytic streptococcus and have not been reported for Streptococcus pyogenes (group A). (CLSI)   WET PREP FOR TRICH, YEAST, CLUE     Status: Abnormal   Collection Time: 04/24/18 10:18 AM  Result Value Ref Range Status   Trichomonas Exam Negative Negative Final   Yeast Exam Positive (A) Negative Final   Clue Cell Exam Positive (A) Negative Final  GC/Chlamydia Probe Amp     Status: None   Collection Time: 04/24/18 10:20 AM  Result Value Ref Range Status   Chlamydia trachomatis, NAA Negative Negative Final   Neisseria gonorrhoeae by PCR Negative Negative Final  Urine culture     Status: Abnormal   Collection Time: 04/30/18  8:08 AM  Result Value Ref Range Status   Specimen Description   Final    URINE, RANDOM Performed at [facility], [address] [place], [place] [address]    Special Requests   Final    NONE Performed at [facility], [address] [place], [place] [address]    Culture (A)  Final    80,000 COLONIES/mL GROUP B STREP(S.AGALACTIAE)ISOLATED TESTING AGAINST S. AGALACTIAE NOT ROUTINELY PERFORMED DUE TO PREDICTABILITY OF AMP/PEN/VAN SUSCEPTIBILITY. Performed at [facility], [address] [place], [place] [address]    Report Status 05/01/2018 FINAL  Final  Blood culture (routine x 2)     Status: None (Preliminary result)   Collection Time: 04/30/18  8:08 AM  Result Value Ref Range Status   Specimen Description BLOOD RIGHT ANTECUBITAL  Final   Special Requests   Final    BOTTLES DRAWN AEROBIC AND ANAEROBIC Blood Culture results may not be optimal due to an excessive volume of blood received in culture bottles   Culture   Final    NO GROWTH 2 DAYS Performed at [facility], [address] [place], [place] [address]    Report Status PENDING  Incomplete  Blood culture (routine x 2)     Status: None (Preliminary result)   Collection Time: 04/30/18  8:09 AM  Result Value Ref Range Status    Specimen Description BLOOD LEFT ANTECUBITAL  Final   Special Requests   Final    BOTTLES DRAWN AEROBIC AND ANAEROBIC Blood Culture results may not be optimal due to an excessive volume of blood received in culture bottles   Culture   Final    NO GROWTH 2 DAYS Performed at  [facility], [address], [place], [place] [address]    Report Status PENDING  Incomplete  Gastrointestinal Panel by PCR , Stool     Status: None   Collection Time: 05/01/18 10:07 AM  Result Value Ref Range Status   Campylobacter species NOT DETECTED NOT DETECTED Final   Plesimonas shigelloides NOT DETECTED NOT DETECTED Final   Salmonella species NOT DETECTED NOT DETECTED Final   Yersinia enterocolitica NOT DETECTED NOT DETECTED Final   Vibrio species NOT DETECTED NOT DETECTED Final   Vibrio cholerae NOT DETECTED NOT DETECTED Final   Enteroaggregative E coli (EAEC) NOT DETECTED NOT DETECTED Final   Enteropathogenic E coli (EPEC) NOT DETECTED NOT DETECTED Final   Enterotoxigenic E coli (ETEC) NOT DETECTED NOT DETECTED Final   Shiga like toxin producing E coli (STEC) NOT DETECTED NOT DETECTED Final   Shigella/Enteroinvasive E coli (EIEC) NOT DETECTED NOT DETECTED Final   Cryptosporidium NOT DETECTED NOT DETECTED Final   Cyclospora cayetanensis NOT DETECTED NOT DETECTED Final   Entamoeba histolytica NOT DETECTED NOT DETECTED Final   Giardia lamblia NOT DETECTED NOT DETECTED Final   Adenovirus F40/41 NOT DETECTED NOT DETECTED Final   Astrovirus NOT DETECTED NOT DETECTED Final   Norovirus GI/GII NOT DETECTED NOT DETECTED Final   Rotavirus A NOT DETECTED NOT DETECTED Final   Sapovirus (I, II, IV, and V) NOT DETECTED NOT DETECTED Final    Comment: Performed at [facility], [address] [place], [place] [address]      Scheduled Meds: . cholecalciferol  1,000 Units Oral Daily  . citalopram  40 mg Oral QHS  . enoxaparin (LOVENOX) injection  30 mg Subcutaneous Q24H   Continuous Infusions: .  cefTRIAXone (ROCEPHIN)  IV Stopped (05/02/18 1706)    Assessment/Plan:  * UTI with sepsis POA> Change to kelfex tomorrow ta d/c  *Hypokalemia.  Replace potassium and IV fluids and orally *Prolonged QT interval with tachycardia.  Continue to monitor at this point.  Cardiology recommended replacing electrolytes  *  Depression on Celexa  Code Status:     Code Status Orders  (From admission, onward)         Start     Ordered   04/30/18 1151  Full code  Continuous     04/30/18 1150        Code Status History    Date Active Date Inactive Code Status Order ID Comments User Context   03/28/2017 1151 03/29/2017 1638 Full Code [id number]  Alford Highland, MD ED   07/08/2015 1616 07/10/2015 1635 Full Code [id number]  Clapacs, Jackquline Denmark, MD Inpatient     Disposition Plan: Reevaluate this afternoon and recheck potassium.  Consultants:  Cardiology  Antibiotics:  Rocephin  Time spent: 30 minutes  Genevie Elman R Brinklee Cisse  [employer]

## 2018-05-03 NOTE — ED Provider Notes (Signed)
[facility] Emergency Department Provider Note  ____________________________________________  Time seen: Approximately 9:45 PM  I have reviewed the triage vital signs and the nursing notes.   HISTORY  Chief Complaint Arm Pain    HPI Jennifer Watts is a 22 y.o. female with a history of multiple sclerosis, depression, recent admission for sepsis and urinary tract infection who comes to the ED today complaining of pain and swelling in the left forearm.  She just left the hospital today and notes that she had an IV that was infiltrated, and she wanted to be sure that it is okay.  Denies fever chills chest pain shortness of breath.  She is on antibiotics from the hospital.  No other acute complaints other than pain and swelling in the left forearm which was present before she left the hospital.   Gradual onset.     Past Medical History:  Diagnosis Date  . Anxiety   . Depression   . MS (multiple sclerosis) (HCC)   . Neuromuscular disorder [facility](HCC)      Patient Active Problem List   Diagnosis Date Noted  . Sepsis (HCC) 04/30/2018  . Urinary tract infection 04/26/2018  . Bacterial vaginosis 04/24/2018  . Yeast infection of the vagina 04/24/2018  . Migraine without aura and without status migrainosus, not intractable 03/14/2017  . Panic disorder 07/08/2015  . Suicidal ideation 07/08/2015  . 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 Tobacco use disorder 07/08/2015  . Cannabis use disorder, moderate, dependence (HCC) 07/08/2015  . Dysmenorrhea in adolescent 06/02/2015  . Severe anxiety 10/28/2014  . Multiple sclerosis (HCC) 10/26/2014  . Depression 02/27/2014  . Anxiety 01/20/2014  . Vitamin D deficiency 12/29/2013     Past Surgical History:  Procedure Laterality Date  . TYMPANOSTOMY TUBE PLACEMENT       Prior to Admission medications   Medication Sig Start Date End Date Taking? Authorizing Provider   cephALEXin (KEFLEX) 250 MG capsule Take 1 capsule (250 mg total) by mouth 3 (three) times daily for 3 days. 05/02/18 05/05/18  Milagros LollSudini, Srikar, MD  Cholecalciferol (VITAMIN D PO) Take 1 tablet by mouth daily.    [provider]  citalopram (CELEXA) 40 MG tablet TAKE 1 TABLET BY MOUTH EVERY DAY 02/22/18   [provider]  Cyanocobalamin (B-12 PO) Take 1 tablet by mouth daily.    [provider]  famotidine (PEPCID) 20 MG tablet Take 1 tablet (20 mg total) by mouth 2 (two) times daily. 05/03/18 05/03/19  Milagros LollSudini, Srikar, MD  medroxyPROGESTERone (DEPO-PROVERA) 150 MG/ML injection INJECT 1 ML (150 MG TOTAL) INTO THE MUSCLE EVERY 3 (THREE) MONTHS. 12/11/17   Gabriel CirriWicker, Cheryl, NP  omeprazole (PRILOSEC) 40 MG capsule Take 1 capsule (40 mg total) by mouth daily. 05/03/18 05/03/19  Milagros LollSudini, Srikar, MD  promethazine (PHENERGAN) 12.5 MG tablet Take 1 tablet (12.5 mg total) by mouth every 6 (six) hours as needed for nausea or vomiting. 05/02/18   Milagros LollSudini, Srikar, MD  SUMAtriptan (IMITREX) 100 MG tablet Take 50-100 mg by mouth daily as needed. 01/08/17   [provider]     Allergies Reglan [metoclopramide]; Ibuprofen; Penicillins; Sulfa antibiotics; and Sulfasalazine   Family History  Problem Relation Age of Onset  . Hypertension Maternal Grandmother   . Cancer Paternal Grandfather        throat  . Multiple sclerosis Paternal Grandfather   . Anxiety disorder Mother     Social History Social History   Tobacco Use  .  Smoking status: Current Every Day Smoker    Packs/day: 0.50    Types: Cigarettes  . Smokeless tobacco: Never Used  Substance Use Topics  . Alcohol use: No  . Drug use: No    Review of Systems  Constitutional:   No fever or chills.  Cardiovascular:   No chest pain or syncope. Respiratory:   No dyspnea or cough. Gastrointestinal:   Negative for abdominal pain, vomiting and diarrhea.  Musculoskeletal:   Left forearm pain and swelling as above.   Otherwise no acute pain or swelling complaints. All other systems reviewed and are negative except as documented above in ROS and HPI.  ____________________________________________   PHYSICAL EXAM:  VITAL SIGNS: ED Triage Vitals  Enc Vitals Group     BP 05/03/18 2101 (!) 144/100     Pulse Rate 05/03/18 2101 (!) 115     Resp --      Temp 05/03/18 2101 98.4 F (36.9 C)     Temp Source 05/03/18 2101 Oral     SpO2 05/03/18 2101 99 %     Weight 05/03/18 2059 100 lb (45.4 kg)     Height 05/03/18 2059 5\' 5"  (1.651 m)     Head Circumference --      Peak Flow --      Pain Score 05/03/18 2059 3     Pain Loc --      Pain Edu? --      Excl. in GC? --     Vital signs reviewed, nursing assessments reviewed.   Constitutional:   Alert and oriented. Non-toxic appearance. Eyes:   Conjunctivae are normal. ENT      Head:   Normocephalic and atraumatic.       Neck:   No meningismus. Full ROM.  Cardiovascular:   RRR. Symmetric bilateral radial  pulses.  Respiratory:   Normal respiratory effort without tachypnea/retractions. Musculoskeletal:   Normal range of motion in all extremities. No joint effusions.  No lower extremity tenderness.  Mild swelling in the distal left forearm.  Tissues and compartments are soft.  No induration.  There is a small amount of prominent vein on the volar aspect distally, and about a 3 cm hazy erythematous area on the dorsal aspect in the area where she indicates she had the infiltrated IV.  No induration, no fluctuance or focal tenderness or warmth.  No crepitus.  Brisk distal capillary refill.  Hand is warm. Neurologic:   Normal speech and language.  Motor grossly intact. No acute focal neurologic deficits are appreciated.  Skin:    Skin is warm, dry and intact. No rash noted.  No petechiae, purpura, or bullae.  ____________________________________________    LABS (pertinent positives/negatives) (all labs ordered are listed, but only abnormal results are  displayed) Labs Reviewed - No data to display ____________________________________________   EKG    ____________________________________________    RADIOLOGY  No results found.  ____________________________________________   PROCEDURES Procedures  ____________________________________________    CLINICAL IMPRESSION / ASSESSMENT AND PLAN / ED COURSE  Pertinent labs & imaging results that were available during my care of the patient were reviewed by me and considered in my medical decision making (see chart for details).    Patient is nontoxic, not in distress.  Presents with pain and swelling of the left forearm after recent IV infiltration.  Do not think she has a DVT or skin or soft tissue infection.  I doubt a vascular injury.  This appears to be a localized reaction, which  can be treated with heat therapy, elevation.  Recommended she continue her antibiotics and other medications, follow-up with primary care.      ____________________________________________   FINAL CLINICAL IMPRESSION(S) / ED DIAGNOSES    Final diagnoses:  Left arm pain     ED Discharge Orders    None      Portions of this note were generated with dragon dictation software. Dictation errors may occur despite best attempts at proofreading.   Sharman Cheek, MD 05/03/18 2149

## 2018-05-05 ENCOUNTER — Other Ambulatory Visit: Payer: Self-pay

## 2018-05-05 ENCOUNTER — Emergency Department: Payer: Medicaid Other

## 2018-05-05 ENCOUNTER — Emergency Department
Admission: EM | Admit: 2018-05-05 | Discharge: 2018-05-05 | Disposition: A | Discharge status: Home / Self Care | Payer: Medicaid Other | Attending: Emergency Medicine | Admitting: Emergency Medicine

## 2018-05-05 DIAGNOSIS — R110 Nausea: Secondary | ICD-10-CM | POA: Diagnosis not present

## 2018-05-05 DIAGNOSIS — F17210 Nicotine dependence, cigarettes, uncomplicated: Secondary | ICD-10-CM | POA: Diagnosis not present

## 2018-05-05 DIAGNOSIS — R42 Dizziness and giddiness: Secondary | ICD-10-CM | POA: Diagnosis not present

## 2018-05-05 DIAGNOSIS — R0789 Other chest pain: Secondary | ICD-10-CM | POA: Insufficient documentation

## 2018-05-05 DIAGNOSIS — Z79899 Other long term (current) drug therapy: Secondary | ICD-10-CM | POA: Insufficient documentation

## 2018-05-05 DIAGNOSIS — E860 Dehydration: Secondary | ICD-10-CM

## 2018-05-05 DIAGNOSIS — R002 Palpitations: Secondary | ICD-10-CM | POA: Diagnosis present

## 2018-05-05 DIAGNOSIS — R000 Tachycardia, unspecified: Secondary | ICD-10-CM | POA: Diagnosis not present

## 2018-05-05 LAB — CBC
HCT: 49.3 % — ABNORMAL HIGH (ref 36.0–46.0)
Hemoglobin: 16.9 g/dL — ABNORMAL HIGH (ref 12.0–15.0)
MCH: 31.4 pg (ref 26.0–34.0)
MCHC: 34.3 g/dL (ref 30.0–36.0)
MCV: 91.5 fL (ref 80.0–100.0)
Platelets: 478 10*3/uL — ABNORMAL HIGH (ref 150–400)
RBC: 5.39 MIL/uL — ABNORMAL HIGH (ref 3.87–5.11)
RDW: 12 % (ref 11.5–15.5)
WBC: 16.3 10*3/uL — AB (ref 4.0–10.5)
nRBC: 0 % (ref 0.0–0.2)

## 2018-05-05 LAB — BASIC METABOLIC PANEL
Anion gap: 14 (ref 5–15)
BUN: 11 mg/dL (ref 6–20)
CO2: 21 mmol/L — ABNORMAL LOW (ref 22–32)
Calcium: 10.2 mg/dL (ref 8.9–10.3)
Chloride: 104 mmol/L (ref 98–111)
Creatinine, Ser: 0.75 mg/dL (ref 0.44–1.00)
GFR calc Af Amer: >60 mL/min (ref >60)
GFR calc non Af Amer: >60 mL/min (ref >60)
GLUCOSE: 123 mg/dL — AB (ref 70–99)
Potassium: 3.4 mmol/L — ABNORMAL LOW (ref 3.5–5.1)
SODIUM: 139 mmol/L (ref 135–145)

## 2018-05-05 LAB — CULTURE, BLOOD (ROUTINE X 2)
CULTURE: NO GROWTH
Culture: NO GROWTH

## 2018-05-05 LAB — TROPONIN I: Troponin I: <0.03 ng/mL (ref <0.03)

## 2018-05-05 LAB — MAGNESIUM: Magnesium: 2.3 mg/dL (ref 1.7–2.4)

## 2018-05-05 MED ORDER — SODIUM CHLORIDE 0.9 % IV BOLUS
1000.0000 mL | Freq: Once | INTRAVENOUS | Status: AC
  Administered 2018-05-05: 1000 mL via INTRAVENOUS

## 2018-05-05 NOTE — ED Provider Notes (Signed)
[facility] Emergency Department Provider Note ____________________________________________   First MD Initiated Contact with Patient 05/05/18 1728     (approximate)  I have reviewed the triage vital signs and the nursing notes.   HISTORY  Chief Complaint Chest Pain and Tachycardia    HPI Jennifer Watts is a 22 y.o. female with PMH as noted below who presents with palpitations and chest pressure, acute onset approximately hour ago, occurring after she smoke a cigarette, and associated with lightheadedness and nausea.  The patient has been treated for a UTI with sepsis, and is currently on Keflex and Phenergan.  She denies any prior history of similar very high heart rate.  With her family members out of the room I asked her about medication and illicit substance use.  She denies any illicit drugs other than occasional marijuana (but none recently) and no prescription medications that are not prescribed to her.  Past Medical History:  Diagnosis Date  . Anxiety   . Depression   . MS (multiple sclerosis) (HCC)   . Neuromuscular disorder [facility])     Patient Active Problem List   Diagnosis Date Noted  . Sepsis (HCC) 04/30/2018  . Urinary tract infection 04/26/2018  . Bacterial vaginosis 04/24/2018  . Yeast infection of the vagina 04/24/2018  . Migraine without aura and without status migrainosus, not intractable 03/14/2017  . Panic disorder 07/08/2015  . Suicidal ideation 07/08/2015  . 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 Tobacco use disorder 07/08/2015  . Cannabis use disorder, moderate, dependence (HCC) 07/08/2015  . Dysmenorrhea in adolescent 06/02/2015  . Severe anxiety 10/28/2014  . Multiple sclerosis (HCC) 10/26/2014  . Depression 02/27/2014  . Anxiety 01/20/2014  . Vitamin D deficiency 12/29/2013    Past Surgical History:  Procedure Laterality Date  . TYMPANOSTOMY TUBE PLACEMENT      Prior to  Admission medications   Medication Sig Start Date End Date Taking? Authorizing Provider  cephALEXin (KEFLEX) 250 MG capsule Take 1 capsule (250 mg total) by mouth 3 (three) times daily for 3 days. 05/02/18 05/05/18  Milagros Loll, MD  Cholecalciferol (VITAMIN D PO) Take 1 tablet by mouth daily.    [provider]  citalopram (CELEXA) 40 MG tablet TAKE 1 TABLET BY MOUTH EVERY DAY 02/22/18   [provider]  Cyanocobalamin (B-12 PO) Take 1 tablet by mouth daily.    [provider]  famotidine (PEPCID) 20 MG tablet Take 1 tablet (20 mg total) by mouth 2 (two) times daily. 05/03/18 05/03/19  Milagros Loll, MD  medroxyPROGESTERone (DEPO-PROVERA) 150 MG/ML injection INJECT 1 ML (150 MG TOTAL) INTO THE MUSCLE EVERY 3 (THREE) MONTHS. 12/11/17   Gabriel Cirri, NP  omeprazole (PRILOSEC) 40 MG capsule Take 1 capsule (40 mg total) by mouth daily. 05/03/18 05/03/19  Milagros Loll, MD  promethazine (PHENERGAN) 12.5 MG tablet Take 1 tablet (12.5 mg total) by mouth every 6 (six) hours as needed for nausea or vomiting. 05/02/18   Milagros Loll, MD  SUMAtriptan (IMITREX) 100 MG tablet Take 50-100 mg by mouth daily as needed. 01/08/17   [provider]    Allergies Reglan [metoclopramide]; Ibuprofen; Penicillins; Sulfa antibiotics; and Sulfasalazine  Family History  Problem Relation Age of Onset  . Hypertension Maternal Grandmother   . Cancer Paternal Grandfather        throat  . Multiple sclerosis Paternal Grandfather   . Anxiety disorder Mother     Social History Social History   Tobacco  Use  . Smoking status: Current Every Day Smoker    Packs/day: 0.50    Types: Cigarettes  . Smokeless tobacco: Never Used  Substance Use Topics  . Alcohol use: No  . Drug use: No    Review of Systems  Constitutional: No fever. Eyes: No redness. ENT: No sore throat. Cardiovascular: Positive for chest pain and palpitations. Respiratory: Positive for shortness of  breath. Gastrointestinal: No vomiting. Genitourinary: Negative for dysuria.  Musculoskeletal: Negative for back pain. Skin: Negative for rash. Neurological: Negative for headache.   ____________________________________________   PHYSICAL EXAM:  VITAL SIGNS: ED Triage Vitals [05/05/18 1721]  Enc Vitals Group     BP (!) 139/95     Pulse Rate (!) 169     Resp 20     Temp 98.1 F (36.7 C)     Temp Source Oral     SpO2 100 %     Weight 100 lb (45.4 kg)     Height 5\' 5"  (1.651 m)     Head Circumference      Peak Flow      Pain Score 4     Pain Loc      Pain Edu?      Excl. in GC?     Constitutional: Alert and oriented.  Extremely anxious and uncomfortable appearing.   Eyes: Conjunctivae are normal.  Head: Atraumatic. Nose: No congestion/rhinnorhea. Mouth/Throat: Mucous membranes are dry.   Neck: Normal range of motion.  Cardiovascular: Tachycardic, regular rhythm. Grossly normal heart sounds.  Good peripheral circulation. Respiratory: Normal respiratory effort.  No retractions. Lungs CTAB. Gastrointestinal: No distention.  Musculoskeletal: No lower extremity edema.  Extremities warm and well perfused.  Neurologic:  Normal speech and language.  Motor intact in all extremities.  No gross focal neurologic deficits are appreciated.  Skin:  Skin is warm and dry. No rash noted. Psychiatric: Anxious appearing.  ____________________________________________   LABS (all labs ordered are listed, but only abnormal results are displayed)  Labs Reviewed  BASIC METABOLIC PANEL - Abnormal; Notable for the following components:      Result Value   Potassium 3.4 (*)    CO2 21 (*)    Glucose, Bld 123 (*)    All other components within normal limits  CBC - Abnormal; Notable for the following components:   WBC 16.3 (*)    RBC 5.39 (*)    Hemoglobin 16.9 (*)    HCT 49.3 (*)    Platelets 478 (*)    All other components within normal limits  TROPONIN I  MAGNESIUM  URINALYSIS,  COMPLETE (UACMP) WITH MICROSCOPIC  URINE DRUG SCREEN, QUALITATIVE ([facility] ONLY)  POC URINE PREG, ED   ____________________________________________  EKG  ED ECG REPORT I, Dionne Bucy, the attending physician, personally viewed and interpreted this ECG.  Date: 05/05/2018 EKG Time: 1717 Rate: 159 Rhythm: Sinus tachycardia QRS Axis: normal Intervals: normal ST/T Wave abnormalities: Nonspecific abnormalities, likely rate related Narrative Interpretation: Sinus tachycardia with no evidence of acute ischemia  ED ECG REPORT I, Dionne Bucy, the attending physician, personally viewed and interpreted this ECG.  Date: 05/05/2018 EKG Time: 1837 Rate: 109 Rhythm: Sinus tachycardia QRS Axis: normal Intervals: normal ST/T Wave abnormalities: Borderline T wave abnormality Narrative Interpretation: no evidence of acute ischemia; no significant change when compared to EKG of 04/30/2018   ____________________________________________  RADIOLOGY  CXR: No focal infiltrate or other acute abnormality  ____________________________________________   PROCEDURES  Procedure(s) performed: No  Procedures  Critical Care performed: Yes  CRITICAL CARE Performed by: Dionne BucySebastian Krystan Northrop   Total critical care time: 30 minutes  Critical care time was exclusive of separately billable procedures and treating other patients.  Critical care was necessary to treat or prevent imminent or life-threatening deterioration.  Critical care was time spent personally by me on the following activities: development of treatment plan with patient and/or surrogate as well as nursing, discussions with consultants, evaluation of patient's response to treatment, examination of patient, obtaining history from patient or surrogate, ordering and performing treatments and interventions, ordering and review of laboratory studies, ordering and review of radiographic studies, pulse oximetry and re-evaluation of  patient's condition. ____________________________________________   INITIAL IMPRESSION / ASSESSMENT AND PLAN / ED COURSE  Pertinent labs & imaging results that were available during my care of the patient were reviewed by me and considered in my medical decision making (see chart for details).  22 year old female with PMH as noted above presents with acute onset of palpitations, chest discomfort, and lightheadedness for about the last hour after smoking a cigarette.  She has had some tachycardia previously but denies any similar episodes in the past.  She denies any illicit drug use or recent heavy alcohol.  I reviewed the past medical records in epic; the patient was admitted earlier this month for UTI with sepsis.  She was discharged last week and is still on Keflex.  She was seen in the ED 2 days ago because of pain and swelling in the left forearm from an IV infiltration.  During her admission she was evaluated by cardiology for a prolonged QT and borderline tachycardia to around 100.  She had some electrolyte abnormalities which were repleted.  On exam currently the patient is extremely anxious and uncomfortable appearing, with significant tachycardia as high as the 170s.  She is also hypertensive.  Her lungs are clear, and the remainder of her exam is unremarkable.  Her neurologic exam is nonfocal.  On EKG and on the monitor, the rate appears sinus although it is quite elevated.  While I was placing an IV, the patient became even more acutely anxious and her heart rate went as high as the 180s briefly.  However while I was in the room the rate spontaneously went back down to the 140s.  Since this appears to be sinus tachycardia rather than SVT, the differential includes dehydration, recurrent infection/sepsis, electrolyte abnormality, or less likely arrhythmia.  The patient's heart rate was quite fast for sinus, but in this young patient was able to compensate with tachycardia, sinus  tachycardia to this rate is certainly possible.  We will give IV fluids, obtain labs, and reassess.  ----------------------------------------- 8:27 PM on 05/05/2018 -----------------------------------------  The lab work-up is unremarkable except for elevated WBC, hemoglobin, and platelets, suggesting hemoconcentration.  The heart rate steadily improved with fluids, and after the full liter was done, the heart rate is now in the high 90s.  The patient's symptoms have completely resolved.  She remained in sinus rhythm throughout.  Overall I suspect a significant component of dehydration, likely exacerbated by anxiety.  I think the patient had some tachycardia related to decreased p.o. intake and dehydration during the infection, then began to feel palpitations, became more anxious, and her symptoms worsened.  There is no evidence of arrhythmia.  EKG obtained when her heart rate was slower is normal for the patient.  I had an extensive discussion with the patient and her family about the results of the work-up.  She feels comfortable going home.  She is stable for discharge at this time.  They have a follow-up arranged with the PMD in 5 days.  Return precautions given, and the patient expresses understanding.  ____________________________________________   FINAL CLINICAL IMPRESSION(S) / ED DIAGNOSES  Final diagnoses:  Sinus tachycardia  Dehydration      NEW MEDICATIONS STARTED DURING THIS VISIT:  New Prescriptions   No medications on file     Note:  This document was prepared using Dragon voice recognition software and may include unintentional dictation errors.    Dionne Bucy, MD 05/05/18 2032

## 2018-05-05 NOTE — Discharge Instructions (Addendum)
Make sure to drink plenty of fluids.  Avoid smoking, alcohol, and caffeine until you are off the antibiotics.  Eat small meals throughout the day.  Continue the antibiotic as prescribed and nausea medicine as needed.  Follow-up with your regular doctor on the 27th as scheduled.  Return to the ER for new, worsening, persistent severe palpitations, chest discomfort, weakness or lightheadedness, or any other new or worsening symptoms that concern you.

## 2018-05-05 NOTE — ED Triage Notes (Signed)
Pt A&O, ambulatory. States was recently treated for urosepsis. Still on antibiotics. Hx anxiety. Shaking in triage. States central CP started today while smoking a cigarette. Took phenergan for nausea. Also c/o SOB. Talking in complete sentences.

## 2018-05-06 ENCOUNTER — Other Ambulatory Visit: Payer: Self-pay

## 2018-05-06 ENCOUNTER — Telehealth: Payer: Self-pay

## 2018-05-06 ENCOUNTER — Emergency Department
Admission: EM | Admit: 2018-05-06 | Discharge: 2018-05-06 | Disposition: A | Discharge status: Home / Self Care | Payer: Medicaid Other | Attending: Emergency Medicine | Admitting: Emergency Medicine

## 2018-05-06 DIAGNOSIS — F410 Panic disorder [episodic paroxysmal anxiety] without agoraphobia: Secondary | ICD-10-CM

## 2018-05-06 DIAGNOSIS — F17210 Nicotine dependence, cigarettes, uncomplicated: Secondary | ICD-10-CM | POA: Diagnosis not present

## 2018-05-06 DIAGNOSIS — F1210 Cannabis abuse, uncomplicated: Secondary | ICD-10-CM | POA: Insufficient documentation

## 2018-05-06 DIAGNOSIS — Z79899 Other long term (current) drug therapy: Secondary | ICD-10-CM | POA: Diagnosis not present

## 2018-05-06 DIAGNOSIS — F1290 Cannabis use, unspecified, uncomplicated: Secondary | ICD-10-CM

## 2018-05-06 DIAGNOSIS — R000 Tachycardia, unspecified: Secondary | ICD-10-CM | POA: Diagnosis not present

## 2018-05-06 LAB — CBC WITH DIFFERENTIAL/PLATELET
Abs Immature Granulocytes: 0.07 10*3/uL (ref 0.00–0.07)
Basophils Absolute: 0.1 10*3/uL (ref 0.0–0.1)
Basophils Relative: 1 %
EOS ABS: 0.3 10*3/uL (ref 0.0–0.5)
Eosinophils Relative: 3 %
HCT: 42.7 % (ref 36.0–46.0)
Hemoglobin: 14.6 g/dL (ref 12.0–15.0)
Immature Granulocytes: 1 %
Lymphocytes Relative: 22 %
Lymphs Abs: 2.5 10*3/uL (ref 0.7–4.0)
MCH: 31.1 pg (ref 26.0–34.0)
MCHC: 34.2 g/dL (ref 30.0–36.0)
MCV: 90.9 fL (ref 80.0–100.0)
Monocytes Absolute: 0.6 10*3/uL (ref 0.1–1.0)
Monocytes Relative: 5 %
Neutro Abs: 8.2 10*3/uL — ABNORMAL HIGH (ref 1.7–7.7)
Neutrophils Relative %: 68 %
Platelets: 333 10*3/uL (ref 150–400)
RBC: 4.7 MIL/uL (ref 3.87–5.11)
RDW: 12 % (ref 11.5–15.5)
WBC: 11.7 10*3/uL — ABNORMAL HIGH (ref 4.0–10.5)
nRBC: 0 % (ref 0.0–0.2)

## 2018-05-06 LAB — BASIC METABOLIC PANEL
ANION GAP: 9 (ref 5–15)
BUN: 13 mg/dL (ref 6–20)
CALCIUM: 9 mg/dL (ref 8.9–10.3)
CO2: 20 mmol/L — ABNORMAL LOW (ref 22–32)
Chloride: 105 mmol/L (ref 98–111)
Creatinine, Ser: 0.66 mg/dL (ref 0.44–1.00)
GFR calc Af Amer: >60 mL/min (ref >60)
GFR calc non Af Amer: >60 mL/min (ref >60)
Glucose, Bld: 154 mg/dL — ABNORMAL HIGH (ref 70–99)
Potassium: 3.3 mmol/L — ABNORMAL LOW (ref 3.5–5.1)
Sodium: 134 mmol/L — ABNORMAL LOW (ref 135–145)

## 2018-05-06 MED ORDER — SODIUM CHLORIDE 0.9 % IV BOLUS
1000.0000 mL | Freq: Once | INTRAVENOUS | Status: AC
  Administered 2018-05-06: 1000 mL via INTRAVENOUS

## 2018-05-06 NOTE — Telephone Encounter (Signed)
Left msg for pt to call back for TCM and confirm hospital follow up on Fri 05/10/18 9:00 with Jolene Cannady.

## 2018-05-06 NOTE — ED Provider Notes (Signed)
[facility] Emergency Department Provider Note  ____________________________________________   I have reviewed the triage vital signs and the nursing notes.   HISTORY  Chief Complaint Tachycardia   History limited by: Not Limited   HPI Jennifer Watts is a 22 y.o. female who presents to the emergency department today because of concerns for anxiety and fast heart rate.  The patient states his symptoms started shortly after she smoked tobacco and marijuana.  She was seen yesterday for similar symptoms after the same activity.  By the time my exam patient already stated she felt improved.  She denied any chest pain.  She denies any fevers.  No vomiting.   Per medical record review patient has a history of recent admission for urinary tract infection and sepsis.  Seen in the emergency department yesterday for similar symptoms as today's presentation.  Past Medical History:  Diagnosis Date  . Anxiety   . Depression   . MS (multiple sclerosis) (HCC)   . Neuromuscular disorder [facility](HCC)     Patient Active Problem List   Diagnosis Date Noted  . Sepsis (HCC) 04/30/2018  . Urinary tract infection 04/26/2018  . Bacterial vaginosis 04/24/2018  . Yeast infection of the vagina 04/24/2018  . Migraine without aura and without status migrainosus, not intractable 03/14/2017  . Panic disorder 07/08/2015  . Suicidal ideation 07/08/2015  . 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 Tobacco use disorder 07/08/2015  . Cannabis use disorder, moderate, dependence (HCC) 07/08/2015  . Dysmenorrhea in adolescent 06/02/2015  . Severe anxiety 10/28/2014  . Multiple sclerosis (HCC) 10/26/2014  . Depression 02/27/2014  . Anxiety 01/20/2014  . Vitamin D deficiency 12/29/2013    Past Surgical History:  Procedure Laterality Date  . TYMPANOSTOMY TUBE PLACEMENT      Prior to Admission medications   Medication Sig Start Date End Date  Taking? Authorizing Provider  Cholecalciferol (VITAMIN D PO) Take 1 tablet by mouth daily.    [provider]  citalopram (CELEXA) 40 MG tablet TAKE 1 TABLET BY MOUTH EVERY DAY 02/22/18   [provider]  Cyanocobalamin (B-12 PO) Take 1 tablet by mouth daily.    [provider]  famotidine (PEPCID) 20 MG tablet Take 1 tablet (20 mg total) by mouth 2 (two) times daily. 05/03/18 05/03/19  Milagros LollSudini, Srikar, MD  medroxyPROGESTERone (DEPO-PROVERA) 150 MG/ML injection INJECT 1 ML (150 MG TOTAL) INTO THE MUSCLE EVERY 3 (THREE) MONTHS. 12/11/17   Gabriel CirriWicker, Cheryl, NP  omeprazole (PRILOSEC) 40 MG capsule Take 1 capsule (40 mg total) by mouth daily. 05/03/18 05/03/19  Milagros LollSudini, Srikar, MD  promethazine (PHENERGAN) 12.5 MG tablet Take 1 tablet (12.5 mg total) by mouth every 6 (six) hours as needed for nausea or vomiting. 05/02/18   Milagros LollSudini, Srikar, MD  SUMAtriptan (IMITREX) 100 MG tablet Take 50-100 mg by mouth daily as needed. 01/08/17   [provider]    Allergies Reglan [metoclopramide]; Ibuprofen; Penicillins; Sulfa antibiotics; and Sulfasalazine  Family History  Problem Relation Age of Onset  . Hypertension Maternal Grandmother   . Cancer Paternal Grandfather        throat  . Multiple sclerosis Paternal Grandfather   . Anxiety disorder Mother     Social History Social History   Tobacco Use  . Smoking status: Current Every Day Smoker    Packs/day: 0.50    Types: Cigarettes  . Smokeless tobacco: Never Used  Substance Use Topics  . Alcohol use: No  . Drug use:  No    Review of Systems Constitutional: No fever/chills Eyes: No visual changes. ENT: No sore throat. Cardiovascular: Positive for fast heart rate. Respiratory: Denies shortness of breath. Gastrointestinal: No abdominal pain.  No nausea, no vomiting.  No diarrhea.   Genitourinary: Negative for dysuria. Musculoskeletal: Negative for back pain. Skin: Negative for rash. Neurological: Negative for  headaches, focal weakness or numbness.  ____________________________________________   PHYSICAL EXAM:  VITAL SIGNS: ED Triage Vitals  Enc Vitals Group     BP 05/06/18 1809 (!) 144/90     Pulse Rate 05/06/18 1809 (!) 143     Resp 05/06/18 1808 20     Temp 05/06/18 1809 98.6 F (37 C)     Temp Source 05/06/18 1809 Oral     SpO2 05/06/18 1809 100 %     Weight 05/06/18 1808 100 lb (45.4 kg)     Height 05/06/18 1808 5\' 5"  (1.651 m)     Head Circumference --      Peak Flow --      Pain Score 05/06/18 1808 5   Constitutional: Alert and oriented.  Eyes: Conjunctivae are normal.  ENT      Head: Normocephalic and atraumatic.      Nose: No congestion/rhinnorhea.      Mouth/Throat: Mucous membranes are moist.      Neck: No stridor. Hematological/Lymphatic/Immunilogical: No cervical lymphadenopathy. Cardiovascular: Tachycardic, regular rhythm.  No murmurs, rubs, or gallops.  Respiratory: Normal respiratory effort without tachypnea nor retractions. Breath sounds are clear and equal bilaterally. No wheezes/rales/rhonchi. Gastrointestinal: Soft and non tender. No rebound. No guarding.  Genitourinary: Deferred Musculoskeletal: Normal range of motion in all extremities. No lower extremity edema. Neurologic:  Normal speech and language. No gross focal neurologic deficits are appreciated.  Skin:  Skin is warm, dry and intact. No rash noted. Psychiatric: Mood and affect are normal. Speech and behavior are normal. Patient exhibits appropriate insight and judgment.  ____________________________________________    LABS (pertinent positives/negatives)  CBC wbc 11.7, hgb 14.6, plt 33 BMP na 134, k 3.3, glu 154, cr 0.66  ____________________________________________   EKG  I, Phineas Semen, attending physician, personally viewed and interpreted this EKG  EKG Time: 1806 Rate: 156 Rhythm: sinus tachycardia Axis: normal Intervals: qtc 512 QRS: narrow ST changes: no st  elevation Impression: abnormal ekg   ____________________________________________    RADIOLOGY  None  ____________________________________________   PROCEDURES  Procedures  ____________________________________________   INITIAL IMPRESSION / ASSESSMENT AND PLAN / ED COURSE  Pertinent labs & imaging results that were available during my care of the patient were reviewed by me and considered in my medical decision making (see chart for details).   Patient presented to the emergency department today with again episode of tachycardia and anxiety.  Patient does state that she just smoked and smoked some marijuana. By the time of my exam her heart rate had already significantly improved. Blood work shows an improving WBC count. At this point patient states she feels better. Patient given iv fluids while awaiting blood work. Will discharge.   ____________________________________________   FINAL CLINICAL IMPRESSION(S) / ED DIAGNOSES  Final diagnoses:  Sinus tachycardia  Panic attack  Marijuana use     Note: This dictation was prepared with Dragon dictation. Any transcriptional errors that result from this process are unintentional     Phineas Semen, MD 05/06/18 [phone]

## 2018-05-06 NOTE — ED Notes (Signed)
Pt states she smoked marijuana when she started having the symptoms of palpitations.

## 2018-05-06 NOTE — Discharge Instructions (Signed)
Please seek medical attention for any high fevers, chest pain, shortness of breath, change in behavior, persistent vomiting, bloody stool or any other new or concerning symptoms.  

## 2018-05-06 NOTE — ED Triage Notes (Signed)
Pt c/o having palpitations and feeling shaking today after smoking a cigarette. Pt was just seen here for the same thing yesterday. Pt was just admitted for sepsis 12/17 and states she stopped taking the abx yesterday because she thinks it was causing her palpitations

## 2018-05-10 ENCOUNTER — Inpatient Hospital Stay: Payer: Self-pay | Admitting: Family Medicine

## 2018-05-10 ENCOUNTER — Encounter: Payer: Self-pay | Admitting: Nurse Practitioner

## 2018-05-10 ENCOUNTER — Ambulatory Visit (INDEPENDENT_AMBULATORY_CARE_PROVIDER_SITE_OTHER): Payer: Self-pay | Admitting: Nurse Practitioner

## 2018-05-10 ENCOUNTER — Inpatient Hospital Stay: Payer: Self-pay | Admitting: Nurse Practitioner

## 2018-05-10 VITALS — BP 132/82 | HR 76 | Temp 98.5°F | Ht 65.0 in | Wt 100.3 lb

## 2018-05-10 DIAGNOSIS — B373 Candidiasis of vulva and vagina: Secondary | ICD-10-CM | POA: Diagnosis not present

## 2018-05-10 DIAGNOSIS — B3731 Acute candidiasis of vulva and vagina: Secondary | ICD-10-CM

## 2018-05-10 DIAGNOSIS — B9689 Other specified bacterial agents as the cause of diseases classified elsewhere: Secondary | ICD-10-CM

## 2018-05-10 DIAGNOSIS — N760 Acute vaginitis: Secondary | ICD-10-CM

## 2018-05-10 LAB — WET PREP FOR TRICH, YEAST, CLUE
Clue Cell Exam: POSITIVE — AB
Trichomonas Exam: NEGATIVE
Yeast Exam: NEGATIVE

## 2018-05-10 MED ORDER — CLINDAMYCIN PHOSPHATE 2 % VA CREA: 1.0000 | TOPICAL_CREAM | Freq: Every day | VAGINAL | 0 refills | Status: DC

## 2018-05-10 NOTE — Patient Instructions (Signed)

## 2018-05-10 NOTE — Progress Notes (Signed)
BP 132/82 (BP Location: Left Arm, Patient Position: Sitting, Cuff Size: Normal)   Pulse 76   Temp 98.5 F (36.9 C) (Oral)   Ht 5\' 5"  (1.651 m)   Wt 100 lb 4.8 oz (45.5 kg)   SpO2 97%   BMI 16.69 kg/m    Subjective:    Patient ID: Jennifer Watts, female    DOB: 11/19/1995, 22 y.o.   MRN: [id number]  HPI: Jennifer RoutCassie L Watts is a 22 y.o. female  Chief Complaint  Patient presents with  . Hospitalization Follow-up  . Vaginitis    Patient states she thinks she has another yeast infection   ER FOLLOW UP Recently admitted for urine infection with sepsis, 04/30/18.  Then had 3 more admissions to ER after this 12/20, 12/22, and 12/23 for sinus tachy after smoking MJ and nicotine.  She reports she is no longer smoking either.  States she is overall feeling "much better".   Time since discharge:  Hospital/facility: [facility] Diagnosis: Sepsis 12/20 Procedures/tests: EKG Consultants: none New medications: none Discharge instructions:  Follow-up with PCP Status: better  VAGINAL DISCHARGE Has recurrent infections (yeast and BV).  Recent abx treatment in hospital.  Pt with MS and is on therapy every 6 months.  Have advised her mother and her to follow-up with neuro to determine if side effect from treatment.  Reports she has one sexual partner, has safe sexual relations with this partner. Duration: days Discharge description: white  Pruritus: yes Dysuria: no Malodorous: no Urinary frequency: no Fevers: no Abdominal pain: no  Sexual activity: monogamous, practices safe sex, recent activity two days ago History of sexually transmitted diseases: no Recent antibiotic use: yes Context: recurrent yeast infections  Treatments attempted: none  Relevant past medical, surgical, family and social history reviewed and updated as indicated. Interim medical history since our last visit reviewed. Allergies and medications reviewed and updated.  Review of Systems  Constitutional: Negative for  activity change, appetite change, diaphoresis, fatigue and fever.  Respiratory: Negative for cough, chest tightness and shortness of breath.   Cardiovascular: Negative for chest pain, palpitations and leg swelling.  Gastrointestinal: Negative for abdominal distention, abdominal pain, constipation, diarrhea, nausea and vomiting.  Genitourinary: Positive for vaginal discharge. Negative for dysuria, frequency, hematuria, pelvic pain, urgency and vaginal pain.  Neurological: Negative for dizziness, syncope, weakness, light-headedness, numbness and headaches.  Psychiatric/Behavioral: Negative.     Per HPI unless specifically indicated above     Objective:    BP 132/82 (BP Location: Left Arm, Patient Position: Sitting, Cuff Size: Normal)   Pulse 76   Temp 98.5 F (36.9 C) (Oral)   Ht 5\' 5"  (1.651 m)   Wt 100 lb 4.8 oz (45.5 kg)   SpO2 97%   BMI 16.69 kg/m   Wt Readings from Last 3 Encounters:  05/10/18 100 lb 4.8 oz (45.5 kg)  05/06/18 100 lb (45.4 kg)  05/05/18 100 lb (45.4 kg)    Physical Exam Vitals signs and nursing note reviewed.  Constitutional:      Appearance: She is well-developed.  HENT:     Head: Normocephalic.  Eyes:     General:        Right eye: No discharge.        Left eye: No discharge.     Conjunctiva/sclera: Conjunctivae normal.     Pupils: Pupils are equal, round, and reactive to light.  Neck:     Musculoskeletal: Normal range of motion and neck supple.     Thyroid:  No thyromegaly.     Vascular: No carotid bruit or JVD.  Cardiovascular:     Rate and Rhythm: Normal rate and regular rhythm.     Heart sounds: Normal heart sounds.  Pulmonary:     Effort: Pulmonary effort is normal.     Breath sounds: Normal breath sounds.  Abdominal:     General: Bowel sounds are normal.     Palpations: Abdomen is soft.  Genitourinary:    Exam position: Supine.     Pubic Area: No rash.      Labia:        Right: No rash or tenderness.        Left: No rash or  tenderness.      Cervix: Discharge present.     Comments: Mild erythema bilateral labia with noted yellowish discharge. Lymphadenopathy:     Cervical: No cervical adenopathy.  Skin:    General: Skin is warm and dry.  Neurological:     Mental Status: She is alert and oriented to person, place, and time.  Psychiatric:        Mood and Affect: Mood normal.        Behavior: Behavior normal.        Thought Content: Thought content normal.        Judgment: Judgment normal.     Results for orders placed or performed in visit on 05/10/18  WET PREP FOR TRICH, YEAST, CLUE  Result Value Ref Range   Trichomonas Exam Negative Negative   Yeast Exam Negative Negative   Clue Cell Exam Positive (A) Negative      Assessment & Plan:   Problem List Items Addressed This Visit      Genitourinary   Bacterial vaginosis    Wet prep negative yeast and + clue cell.  Recurrent.  Poorly tolerates oral Flagyl.  Will use Clindamycin cream 2%, script sent to pharmacy.  Avoid sexual intercourse at this time, until improvement.  Referral to GYN for recurrent yeast and BV infections in patient with underlying MS (undergoing treatment Q6MOS).      RESOLVED: Yeast infection of the vagina - Primary   Relevant Orders   Ambulatory referral to Gynecology   WET PREP FOR TRICH, YEAST, CLUE (Completed)       Follow up plan: Return in about 1 month (around 06/10/2018) for follow-up.

## 2018-05-10 NOTE — Assessment & Plan Note (Addendum)
Wet prep negative yeast and + clue cell.  Recurrent.  Poorly tolerates oral Flagyl.  Will use Clindamycin cream 2%, script sent to pharmacy.  Avoid sexual intercourse at this time, until improvement.  Referral to GYN for recurrent yeast and BV infections in patient with underlying MS (undergoing treatment Q6MOS).

## 2018-05-16 NOTE — Discharge Summary (Signed)
[facility] [place] [facility]   PATIENT NAME: Jennifer Watts    MR#:  [id number]  DATE OF BIRTH:  11/15/1995  DATE OF ADMISSION:  04/30/2018 ADMITTING PHYSICIAN: Enedina FinnerSona Patel, MD  DATE OF DISCHARGE: 05/03/2018 11:56 AM  PRIMARY CARE PHYSICIAN: Aura Dialsannady, Jolene T, NP   ADMISSION DIAGNOSIS:  Hypokalemia [E87.6] Acute cystitis without hematuria [N30.00] QT prolongation [R94.31] Sepsis, due to unspecified organism, unspecified whether acute organ dysfunction present (HCC) [A41.9]  DISCHARGE DIAGNOSIS:  Active Problems:   Sepsis (HCC)   SECONDARY DIAGNOSIS:   Past Medical History:  Diagnosis Date  . Anxiety   . Depression   . MS (multiple sclerosis) (HCC)   . Neuromuscular disorder (HCC)      ADMITTING HISTORY  CHIEF COMPLAINT:  I am not feeling well for last few days. I was treated for by general infection. I am having nausea and vomiting. Passing out earlier. Sister in the room   HOSPITAL COURSE:   *UTI with sepsis present on admission *Hypokalemia *Prolonged QT interval with tachycardia *Depression *Nausea and vomiting  Patient was admitted to medical floor and started on IV antibiotics.  Treated symptomatically for nausea and vomiting which were secondary to UTI with Zofran and Reglan.  She did have mild prolonged QT interval on EKG and cardiology was consulted.  Dr. Marina GoodellPerry shows with cardiology has seen the patient and she did not need any further work-up.  Advised outpatient follow-up.  By day of discharge patient is afebrile.  Tolerated diet.  No vomiting.  Does have some nausea.  Will be discharged home on Keflex, Zofran.  Follow-up with primary care physician in 1 week.  CONSULTS OBTAINED:    DRUG ALLERGIES:   Allergies  Allergen Reactions  . Reglan [Metoclopramide] Other (See Comments)    Hyperactivity /becomes mean  . Ibuprofen Rash  . Penicillins Rash    Has patient had a PCN reaction causing immediate rash,  facial/tongue/throat swelling, SOB or lightheadedness with hypotension: No Has patient had a PCN reaction causing severe rash involving mucus membranes or skin necrosis: No Has patient had a PCN reaction that required hospitalization: No Has patient had a PCN reaction occurring within the last 10 years: No If all of the above answers are "NO", then may proceed with Cephalosporin use.   . Sulfa Antibiotics Rash  . Sulfasalazine Rash    DISCHARGE MEDICATIONS:   Allergies as of 05/03/2018      Reactions   Reglan [metoclopramide] Other (See Comments)   Hyperactivity /becomes mean   Ibuprofen Rash   Penicillins Rash   Has patient had a PCN reaction causing immediate rash, facial/tongue/throat swelling, SOB or lightheadedness with hypotension: No Has patient had a PCN reaction causing severe rash involving mucus membranes or skin necrosis: No Has patient had a PCN reaction that required hospitalization: No Has patient had a PCN reaction occurring within the last 10 years: No If all of the above answers are "NO", then may proceed with Cephalosporin use.   Sulfa Antibiotics Rash   Sulfasalazine Rash      Medication List    STOP taking these medications   metroNIDAZOLE 500 MG tablet Commonly known as:  FLAGYL     TAKE these medications   B-12 PO Take 1 tablet by mouth daily.   citalopram 40 MG tablet Commonly known as:  CELEXA TAKE 1 TABLET BY MOUTH EVERY DAY   medroxyPROGESTERone 150 MG/ML injection Commonly known as:  DEPO-PROVERA INJECT 1 ML (150 MG TOTAL) INTO THE  MUSCLE EVERY 3 (THREE) MONTHS.   promethazine 12.5 MG tablet Commonly known as:  PHENERGAN Take 1 tablet (12.5 mg total) by mouth every 6 (six) hours as needed for nausea or vomiting.   SUMAtriptan 100 MG tablet Commonly known as:  IMITREX Take 50-100 mg by mouth daily as needed.   VITAMIN D PO Take 1 tablet by mouth daily.     ASK your doctor about these medications   cephALEXin 250 MG  capsule Commonly known as:  KEFLEX Take 1 capsule (250 mg total) by mouth 3 (three) times daily for 3 days. Ask about: Should I take this medication?       Today   VITAL SIGNS:  Blood pressure (!) 129/97, pulse 84, temperature 98.3 F (36.8 C), temperature source Oral, resp. rate 16, height 5\' 5"  (1.651 m), weight 45.4 kg, SpO2 97 %.  I/O:  No intake or output data in the 24 hours ending 05/16/18 2126  PHYSICAL EXAMINATION:  Physical Exam  GENERAL:  23 y.o.-year-old patient lying in the bed with no acute distress.  LUNGS: Normal breath sounds bilaterally, no wheezing, rales,rhonchi or crepitation. No use of accessory muscles of respiration.  CARDIOVASCULAR: S1, S2 normal. No murmurs, rubs, or gallops.  ABDOMEN: Soft, non-tender, non-distended. Bowel sounds present. No organomegaly or mass.  NEUROLOGIC: Moves all 4 extremities. PSYCHIATRIC: The patient is alert and oriented x 3.  SKIN: No obvious rash, lesion, or ulcer.   DATA REVIEW:   CBC No results for input(s): WBC, HGB, HCT, PLT in the last 168 hours.  Chemistries  No results for input(s): NA, K, CL, CO2, GLUCOSE, BUN, CREATININE, CALCIUM, MG, AST, ALT, ALKPHOS, BILITOT in the last 168 hours.  Invalid input(s): GFRCGP  Cardiac Enzymes No results for input(s): TROPONINI in the last 168 hours.  Microbiology Results  Results for orders placed or performed during the hospital encounter of 04/30/18  Urine culture     Status: Abnormal   Collection Time: 04/30/18  8:08 AM  Result Value Ref Range Status   Specimen Description   Final    URINE, RANDOM Performed at [facility], [address], [place], [place] [address]    Special Requests   Final    NONE Performed at [facility], [address] [place], [place] [address]    Culture (A)  Final    80,000 COLONIES/mL GROUP B STREP(S.AGALACTIAE)ISOLATED TESTING AGAINST S. AGALACTIAE NOT ROUTINELY PERFORMED DUE TO PREDICTABILITY OF AMP/PEN/VAN  SUSCEPTIBILITY. Performed at [facility], [address], [place], [place] [address]    Report Status 05/01/2018 FINAL  Final  Blood culture (routine x 2)     Status: None   Collection Time: 04/30/18  8:08 AM  Result Value Ref Range Status   Specimen Description BLOOD RIGHT ANTECUBITAL  Final   Special Requests   Final    BOTTLES DRAWN AEROBIC AND ANAEROBIC Blood Culture results may not be optimal due to an excessive volume of blood received in culture bottles   Culture   Final    NO GROWTH 5 DAYS Performed at [facility], [address] [place], [place] [address]    Report Status 05/05/2018 FINAL  Final  Blood culture (routine x 2)     Status: None   Collection Time: 04/30/18  8:09 AM  Result Value Ref Range Status   Specimen Description BLOOD LEFT ANTECUBITAL  Final   Special Requests   Final    BOTTLES DRAWN AEROBIC AND ANAEROBIC Blood Culture  results may not be optimal due to an excessive volume of blood received in culture bottles   Culture   Final    NO GROWTH 5 DAYS Performed at [facility], [address] [place], [place] [address]    Report Status 05/05/2018 FINAL  Final  Gastrointestinal Panel by PCR , Stool     Status: None   Collection Time: 05/01/18 10:07 AM  Result Value Ref Range Status   Campylobacter species NOT DETECTED NOT DETECTED Final   Plesimonas shigelloides NOT DETECTED NOT DETECTED Final   Salmonella species NOT DETECTED NOT DETECTED Final   Yersinia enterocolitica NOT DETECTED NOT DETECTED Final   Vibrio species NOT DETECTED NOT DETECTED Final   Vibrio cholerae NOT DETECTED NOT DETECTED Final   Enteroaggregative E coli (EAEC) NOT DETECTED NOT DETECTED Final   Enteropathogenic E coli (EPEC) NOT DETECTED NOT DETECTED Final   Enterotoxigenic E coli (ETEC) NOT DETECTED NOT DETECTED Final   Shiga like toxin producing E coli (STEC) NOT DETECTED NOT DETECTED Final   Shigella/Enteroinvasive E coli (EIEC) NOT DETECTED NOT  DETECTED Final   Cryptosporidium NOT DETECTED NOT DETECTED Final   Cyclospora cayetanensis NOT DETECTED NOT DETECTED Final   Entamoeba histolytica NOT DETECTED NOT DETECTED Final   Giardia lamblia NOT DETECTED NOT DETECTED Final   Adenovirus F40/41 NOT DETECTED NOT DETECTED Final   Astrovirus NOT DETECTED NOT DETECTED Final   Norovirus GI/GII NOT DETECTED NOT DETECTED Final   Rotavirus A NOT DETECTED NOT DETECTED Final   Sapovirus (I, II, IV, and V) NOT DETECTED NOT DETECTED Final    Comment: Performed at [facility], [address] [place], [place] [address]    RADIOLOGY:  No results found.  Follow up with PCP in 1 week.  Management plans discussed with the patient, family and they are in agreement.  CODE STATUS:  Code Status History    Date Active Date Inactive Code Status Order ID Comments User Context   04/30/2018 1151 05/03/2018 1501 Full Code [id number]  Enedina Finner, MD Inpatient   03/28/2017 1151 03/29/2017 1638 Full Code [id number]  Alford Highland, MD ED   07/08/2015 1616 07/10/2015 1635 Full Code [id number]  Clapacs, Jackquline Denmark, MD Inpatient      TOTAL TIME TAKING CARE OF THIS PATIENT ON DAY OF DISCHARGE: more than 30 minutes.   Molinda Bailiff Jull Harral M.D on 05/16/2018 at 9:26 PM  Between 7am to 6pm - Pager - [phone]  After 6pm go to [email or web address] - password [email or web address] [facility]  [facility]  Office  [phone]  CC: Primary care physician; Marjie Skiff, NP  Note: This dictation was prepared with Dragon dictation along with smaller phrase technology. Any transcriptional errors that result from this process are unintentional.

## 2018-05-21 NOTE — ED Provider Notes (Signed)
Addendum 04/30/2018:  CRITICAL CARE Performed by: Rockne Menghini   Total critical care time: 35 minutes  Critical care time was exclusive of separately billable procedures and treating other patients.  Critical care was necessary to treat or prevent imminent or life-threatening deterioration.  Critical care was time spent personally by me on the following activities: development of treatment plan with patient and/or surrogate as well as nursing, discussions with consultants, evaluation of patient's response to treatment, examination of patient, obtaining history from patient or surrogate, ordering and performing treatments and interventions, ordering and review of laboratory studies, ordering and review of radiographic studies, pulse oximetry and re-evaluation of patient's condition.    Rockne Menghini, MD 05/21/18 1409

## 2018-05-25 NOTE — Progress Notes (Deleted)
[facility]  Telephone:([phone] [phone]  ID: Jennifer Watts OB: 01/29/1996  MR#: [id number]  UYQ#:[id number]#:[id number]  Patient Care Team: Marjie Skiffannady, Jolene T, NP as PCP - General (Nurse Practitioner)  CHIEF COMPLAINT: Rituxan infusion for multiple sclerosis.  INTERVAL HISTORY: Patient returns to clinic today for routine six-month evaluation and continuation of Rituxan.  Patient states he feels symptomatically improved since initiating Rituxan, but does note the effects wear off as she is leading up to her next infusion.  She currently feels well and is asymptomatic.  She denies any recent fevers or illnesses. She continues to be anxious. She has no new neurologic complaints. She has no chest pain or shortness of breath. She denies any weight loss. She denies any vomiting, constipation, or diarrhea. She has no urinary complaints.  Patient offers no specific complaints today.  REVIEW OF SYSTEMS:   Review of Systems  Constitutional: Negative.  Negative for fever, malaise/fatigue and weight loss.  Respiratory: Negative.  Negative for cough and shortness of breath.   Cardiovascular: Negative.  Negative for chest pain and leg swelling.  Gastrointestinal: Negative.  Negative for abdominal pain and nausea.  Genitourinary: Negative.  Negative for dysuria.  Musculoskeletal: Negative.  Negative for myalgias.  Skin: Negative.  Negative for rash.  Neurological: Negative.  Negative for sensory change, focal weakness and weakness.  Psychiatric/Behavioral: The patient is nervous/anxious.     As per HPI. Otherwise, a complete review of systems is negative.  PAST MEDICAL HISTORY: Past Medical History:  Diagnosis Date  . Anxiety   . Depression   . MS (multiple sclerosis) (HCC)   . Neuromuscular disorder (HCC)     PAST SURGICAL HISTORY: Past Surgical History:  Procedure Laterality Date  . TYMPANOSTOMY TUBE PLACEMENT      FAMILY HISTORY Family History  Problem  Relation Age of Onset  . Hypertension Maternal Grandmother   . Cancer Paternal Grandfather        throat  . Multiple sclerosis Paternal Grandfather   . Anxiety disorder Mother        ADVANCED DIRECTIVES:    HEALTH MAINTENANCE: Social History   Tobacco Use  . Smoking status: Former Smoker    Packs/day: 0.50    Types: Cigarettes  . Smokeless tobacco: Never Used  Substance Use Topics  . Alcohol use: No  . Drug use: No     Colonoscopy:  PAP:  Bone density:  Lipid panel:  Allergies  Allergen Reactions  . Reglan [Metoclopramide] Other (See Comments)    Hyperactivity /becomes mean  . Ibuprofen Rash  . Penicillins Rash    Has patient had a PCN reaction causing immediate rash, facial/tongue/throat swelling, SOB or lightheadedness with hypotension: No Has patient had a PCN reaction causing severe rash involving mucus membranes or skin necrosis: No Has patient had a PCN reaction that required hospitalization: No Has patient had a PCN reaction occurring within the last 10 years: No If all of the above answers are "NO", then may proceed with Cephalosporin use.   . Sulfa Antibiotics Rash  . Sulfasalazine Rash    Current Outpatient Medications  Medication Sig Dispense Refill  . Cholecalciferol (VITAMIN D PO) Take 1 tablet by mouth daily.    . citalopram (CELEXA) 40 MG tablet TAKE 1 TABLET BY MOUTH EVERY DAY    . clindamycin (CLEOCIN) 2 % vaginal cream Place 1 Applicatorful vaginally at bedtime. 40 g 0  . Cyanocobalamin (B-12 PO) Take 1 tablet by mouth daily.    . medroxyPROGESTERone (  DEPO-PROVERA) 150 MG/ML injection INJECT 1 ML (150 MG TOTAL) INTO THE MUSCLE EVERY 3 (THREE) MONTHS. 1 mL 4  . promethazine (PHENERGAN) 12.5 MG tablet Take 1 tablet (12.5 mg total) by mouth every 6 (six) hours as needed for nausea or vomiting. (Patient not taking: Reported on 05/10/2018) 20 tablet 0  . SUMAtriptan (IMITREX) 100 MG tablet Take 50-100 mg by mouth daily as needed.     No current  facility-administered medications for this visit.    Facility-Administered Medications Ordered in Other Visits  Medication Dose Route Frequency Provider Last Rate Last Dose  . acetaminophen (TYLENOL) tablet 1,000 mg  1,000 mg Oral Once Jeralyn Ruths, MD      . diphenhydrAMINE (BENADRYL) capsule 50 mg  50 mg Oral Once Jeralyn Ruths, MD      . methylPREDNISolone sodium succinate (SOLU-MEDROL) 125 mg/2 mL injection 100 mg  100 mg Intravenous Once Jeralyn Ruths, MD      . riTUXimab (RITUXAN) 1,000 mg in sodium chloride 0.9 % 150 mL chemo infusion  1,000 mg Intravenous Once Jeralyn Ruths, MD        OBJECTIVE: There were no vitals filed for this visit.   There is no height or weight on file to calculate BMI.    ECOG FS:0 - Asymptomatic  General: Thin, no acute distress. Eyes: Pink conjunctiva, anicteric sclera. Lungs: Clear to auscultation bilaterally. Heart: Regular rate and rhythm. No rubs, murmurs, or gallops. Abdomen: Soft, nontender, nondistended. No organomegaly noted, normoactive bowel sounds. Musculoskeletal: No edema, cyanosis, or clubbing. Neuro: Alert, answering all questions appropriately. Cranial nerves grossly intact. Skin: No rashes or petechiae noted. Psych: Normal affect.  LAB RESULTS:  Lab Results  Component Value Date   NA 134 (L) 05/06/2018   K 3.3 (L) 05/06/2018   CL 105 05/06/2018   CO2 20 (L) 05/06/2018   GLUCOSE 154 (H) 05/06/2018   BUN 13 05/06/2018   CREATININE 0.66 05/06/2018   CALCIUM 9.0 05/06/2018   PROT 8.1 05/29/2017   ALBUMIN 3.9 04/30/2018   AST 30 05/29/2017   ALT 14 05/29/2017   ALKPHOS 47 05/29/2017   BILITOT 1.5 (H) 05/29/2017   GFRNONAA >60 05/06/2018   GFRAA >60 05/06/2018    Lab Results  Component Value Date   WBC 11.7 (H) 05/06/2018   NEUTROABS 8.2 (H) 05/06/2018   HGB 14.6 05/06/2018   HCT 42.7 05/06/2018   MCV 90.9 05/06/2018   PLT 333 05/06/2018     STUDIES: Dg Chest 2 View  Result Date:  05/05/2018 CLINICAL DATA:  Chest pain and shortness of breath beginning today. Tachycardia. Smoker. EXAM: CHEST - 2 VIEW COMPARISON:  06/20/2017 FINDINGS: The heart size and mediastinal contours are within normal limits. Both lungs are clear. The visualized skeletal structures are unremarkable. IMPRESSION: No active cardiopulmonary disease. Electronically Signed   By: Myles Rosenthal M.D.   On: 05/05/2018 18:19    ASSESSMENT: Rituxan infusion for multiple sclerosis.  PLAN:    1. Multiple sclerosis: Proceed with 1000 mg IV Rituxan today.  Per neurology, patient only requires treatment every 6 months.  Patient expressed understanding that all laboratory work and imaging will be monitored by her primary neurologist. She also understands that she has any questions regarding her multiple sclerosis or side effects to Rituxan to direct them towards Dr. Malvin Johns. Return to clinic in 6 months for further evaluation and continuation of Rituxan.  2.  Nausea: Patient does not complain of this today.  I spent a total  of 30 minutes face-to-face with the patient of which greater than 50% of the visit was spent in counseling and coordination of care as detailed above.   Patient expressed understanding and was in agreement with this plan. She also understands that She can call clinic at any time with any questions, concerns, or complaints.    Jeralyn Ruths, MD   05/25/2018 8:27 AM

## 2018-05-29 ENCOUNTER — Other Ambulatory Visit: Payer: Self-pay | Admitting: Oncology

## 2018-05-30 LAB — UA/M W/RFLX CULTURE, ROUTINE
BILIRUBIN UA: NEGATIVE
GLUCOSE, UA: NEGATIVE
NITRITE UA: NEGATIVE
Specific Gravity, UA: 1.025 (ref 1.005–1.030)
UUROB: 1 mg/dL (ref 0.2–1.0)
pH, UA: 6.5 (ref 5.0–7.5)

## 2018-05-30 LAB — MICROSCOPIC EXAMINATION

## 2018-05-30 LAB — URINE CULTURE, REFLEX

## 2018-05-31 ENCOUNTER — Ambulatory Visit: Payer: Self-pay | Admitting: Oncology

## 2018-05-31 ENCOUNTER — Ambulatory Visit: Payer: Self-pay

## 2018-06-03 ENCOUNTER — Other Ambulatory Visit: Payer: Self-pay | Admitting: Nurse Practitioner

## 2018-06-03 NOTE — Telephone Encounter (Signed)
Requested medication (s) are due for refill today: yes  Requested medication (s) are on the active medication list: yes    Last refill: 05/11/2019  Future visit scheduled yes 06/11/2018  Notes to clinic:not delegated  Requested Prescriptions  Pending Prescriptions Disp Refills   clindamycin (CLEOCIN) 2 % vaginal cream [Pharmacy Med Name: CLINDAMYCIN 2% VAGINAL CREAM] 40 g 0    Sig: Place 1 Applicatorful vaginally at bedtime.     Off-Protocol Failed - 06/03/2018  6:13 PM      Failed - Medication not assigned to a protocol, review manually.      Passed - Valid encounter within last 12 months    Recent Outpatient Visits          3 weeks ago Yeast infection of the vagina   [facility], [place] T, NP   1 month ago Bacterial vaginosis   [facility] [place], [place] T, NP   2 months ago Vaginal discharge   [facility] Gabriel Cirri, NP   3 years ago Immunization due   [facility] [place], Elnita Maxwell, NP   3 years ago Severe anxiety   [facility] Gabriel Cirri, NP      Future Appointments            In 1 week Cannady, Dorie Rank, NP [facility]

## 2018-06-07 ENCOUNTER — Other Ambulatory Visit: Payer: Self-pay | Admitting: Neurology

## 2018-06-07 DIAGNOSIS — G35 Multiple sclerosis: Secondary | ICD-10-CM

## 2018-06-11 ENCOUNTER — Ambulatory Visit: Payer: Self-pay | Admitting: Nurse Practitioner

## 2018-06-17 ENCOUNTER — Ambulatory Visit
Admission: RE | Admit: 2018-06-17 | Discharge: 2018-06-17 | Disposition: A | Discharge status: Home / Self Care | Payer: Medicaid Other | Source: Ambulatory Visit | Attending: Neurology | Admitting: Neurology

## 2018-06-17 DIAGNOSIS — G35 Multiple sclerosis: Secondary | ICD-10-CM | POA: Diagnosis not present

## 2018-06-17 DIAGNOSIS — G43919 Migraine, unspecified, intractable, without status migrainosus: Secondary | ICD-10-CM | POA: Diagnosis not present

## 2018-06-17 MED ORDER — GADOBUTROL 1 MMOL/ML IV SOLN
4.0000 mL | Freq: Once | INTRAVENOUS | Status: AC | PRN
  Administered 2018-06-17: 4 mL via INTRAVENOUS

## 2018-06-27 ENCOUNTER — Other Ambulatory Visit: Payer: Self-pay | Admitting: Nurse Practitioner

## 2018-06-27 NOTE — Telephone Encounter (Signed)
Requested medication (s) are due for refill today: yes  Requested medication (s) are on the active medication list: yes  Last refill:  06/04/18  Future visit scheduled: no   Notes to clinic:  Unable to refill per protocol    Requested Prescriptions  Pending Prescriptions Disp Refills   clindamycin (CLEOCIN) 2 % vaginal cream [Pharmacy Med Name: CLINDAMYCIN 2% VAGINAL CREAM] 40 g 0    Sig: PLACE 1 APPLICATORFUL VAGINALLY AT BEDTIME.     Off-Protocol Failed - 06/27/2018  5:41 PM      Failed - Medication not assigned to a protocol, review manually.      Passed - Valid encounter within last 12 months    Recent Outpatient Visits          1 month ago [facility] the [facility] [place] [place] T, NP   2 months ago Bacterial vaginosis   [facility] [place], [place]   3 months ago Vaginal discharge   [facility] Gabriel CirriWicker, Cheryl, NP   3 years ago Immunization due   [facility] Cheryl, [facility]   3 years ago Severe anxiety   [facility] CirriWicker, Cheryl, NP

## 2018-06-28 NOTE — Telephone Encounter (Signed)
Refill on Clindamycin gel sent, no further refills after this one unless patient seen by provider or attends GYN appointment (referral placed in December).

## 2018-07-01 ENCOUNTER — Telehealth: Payer: Self-pay

## 2018-07-01 NOTE — Telephone Encounter (Signed)
Prior Authorization for Clindamycin 2% cream approved through medicaid.  Confirmation #: [id number]  Prior Approval #: [id number]  Status: APPROVED

## 2018-07-15 ENCOUNTER — Telehealth: Payer: Self-pay

## 2018-07-15 NOTE — Telephone Encounter (Signed)
PA again via medicaid for refill for Clindamycin 2% vaginal cream.  Approved Confirmation #: [id number] W Prior Approval #: [id number] Status: APPROVED

## 2018-07-23 ENCOUNTER — Encounter: Payer: Self-pay | Admitting: Nurse Practitioner

## 2018-07-23 ENCOUNTER — Ambulatory Visit (INDEPENDENT_AMBULATORY_CARE_PROVIDER_SITE_OTHER): Payer: Medicaid Other | Admitting: Nurse Practitioner

## 2018-07-23 VITALS — BP 126/78 | HR 96 | Temp 98.7°F | Ht 64.61 in | Wt 114.1 lb

## 2018-07-23 DIAGNOSIS — R300 Dysuria: Secondary | ICD-10-CM

## 2018-07-23 DIAGNOSIS — G35 Multiple sclerosis: Secondary | ICD-10-CM

## 2018-07-23 DIAGNOSIS — E559 Vitamin D deficiency, unspecified: Secondary | ICD-10-CM | POA: Diagnosis not present

## 2018-07-23 DIAGNOSIS — B9689 Other specified bacterial agents as the cause of diseases classified elsewhere: Secondary | ICD-10-CM

## 2018-07-23 DIAGNOSIS — N760 Acute vaginitis: Secondary | ICD-10-CM | POA: Diagnosis not present

## 2018-07-23 LAB — WET PREP FOR TRICH, YEAST, CLUE
Clue Cell Exam: POSITIVE — AB
Trichomonas Exam: NEGATIVE
Yeast Exam: NEGATIVE

## 2018-07-23 MED ORDER — METRONIDAZOLE 0.75 % VA GEL: 1.0000 | Freq: Every day | VAGINAL | 0 refills | Status: DC

## 2018-07-23 MED ORDER — NITROFURANTOIN MONOHYD MACRO 100 MG PO CAPS: 100.0000 mg | ORAL_CAPSULE | Freq: Two times a day (BID) | ORAL | 0 refills | Status: DC

## 2018-07-23 NOTE — Assessment & Plan Note (Signed)
+   clue cells and negative yeast.  Script for Flagyl gel sent.  Discussed vaginal care.  Return for ongoing or continued symptoms.

## 2018-07-23 NOTE — Patient Instructions (Signed)

## 2018-07-23 NOTE — Progress Notes (Signed)
BP 126/78 (BP Location: Left Arm, Patient Position: Sitting)   Pulse 96   Temp 98.7 F (37.1 C)   Ht 5' 4.61" (1.641 m)   Wt 114 lb 2 oz (51.8 kg)   SpO2 100%   BMI 19.22 kg/m    Subjective:    Patient ID: Jennifer Watts, female    DOB: [birth year], 23 y.o.   MRN: [id number]  HPI: Jennifer Watts is a 23 y.o. female  Chief Complaint  Patient presents with  . Vaginal Discharge   VAGINAL DISCHARGE Has h/o recurrent yeast infection and BV.  Is on treatment every 6 months for MS.  Last vaginal treatment was 05/10/18.  Has h/o hospitalization with sepsis from UTI 04/30/18.   Duration: days Discharge description: cottage cheese  Pruritus: yes Dysuria: no Malodorous: yes Urinary frequency: no Fevers: no Abdominal pain: no  Sexual activity: monogamous and safe sexual activity at home History of sexually transmitted diseases: no Recent antibiotic use: no Context: recurrent yeast infections  Treatments attempted: none   LAB WORK: Patient and mother are requesting blood work to check electrolyte, CBC, TSH, and Vitamin D level. Reports history of abnormal Vitamin D level.  Relevant past medical, surgical, family and social history reviewed and updated as indicated. Interim medical history since our last visit reviewed. Allergies and medications reviewed and updated.  Review of Systems  Constitutional: Negative for activity change, appetite change, diaphoresis, fatigue and fever.  Respiratory: Negative for cough, chest tightness and shortness of breath.   Cardiovascular: Negative for chest pain, palpitations and leg swelling.  Gastrointestinal: Negative for abdominal distention, abdominal pain, constipation, diarrhea, nausea and vomiting.  Endocrine: Negative for cold intolerance, heat intolerance, polydipsia, polyphagia and polyuria.  Genitourinary: Positive for vaginal discharge. Negative for dysuria, flank pain, frequency, hematuria, pelvic pain, urgency, vaginal bleeding  and vaginal pain.  Neurological: Negative for dizziness, syncope, weakness, light-headedness, numbness and headaches.  Psychiatric/Behavioral: Negative.     Per HPI unless specifically indicated above     Objective:    BP 126/78 (BP Location: Left Arm, Patient Position: Sitting)   Pulse 96   Temp 98.7 F (37.1 C)   Ht 5' 4.61" (1.641 m)   Wt 114 lb 2 oz (51.8 kg)   SpO2 100%   BMI 19.22 kg/m   Wt Readings from Last 3 Encounters:  07/23/18 114 lb 2 oz (51.8 kg)  05/10/18 100 lb 4.8 oz (45.5 kg)  05/06/18 100 lb (45.4 kg)    Physical Exam Vitals signs and nursing note reviewed.  Constitutional:      General: She is awake.     Appearance: She is well-developed.  HENT:     Head: Normocephalic.  Eyes:     General:        Right eye: No discharge.        Left eye: No discharge.     Conjunctiva/sclera: Conjunctivae normal.     Pupils: Pupils are equal, round, and reactive to light.  Neck:     Musculoskeletal: Normal range of motion and neck supple.     Thyroid: No thyromegaly.     Vascular: No carotid bruit or JVD.  Cardiovascular:     Rate and Rhythm: Normal rate and regular rhythm.     Heart sounds: Normal heart sounds.  Pulmonary:     Effort: Pulmonary effort is normal.     Breath sounds: Normal breath sounds.  Abdominal:     General: Bowel sounds are normal.  Palpations: Abdomen is soft.     Tenderness: There is abdominal tenderness in the suprapubic area. There is no right CVA tenderness, left CVA tenderness or guarding.  Genitourinary:    Comments: Deferred, self swab per patient request. Lymphadenopathy:     Cervical: No cervical adenopathy.  Skin:    General: Skin is warm and dry.  Neurological:     Mental Status: She is alert and oriented to person, place, and time.  Psychiatric:        Attention and Perception: Attention normal.        Mood and Affect: Mood normal.        Speech: Speech normal.        Behavior: Behavior normal. Behavior is  cooperative.        Thought Content: Thought content normal.        Judgment: Judgment normal.     Results for orders placed or performed in visit on 05/10/18  WET PREP FOR TRICH, YEAST, CLUE  Result Value Ref Range   Trichomonas Exam Negative Negative   Yeast Exam Negative Negative   Clue Cell Exam Positive (A) Negative      Assessment & Plan:   Problem List Items Addressed This Visit      Nervous and Auditory   Multiple sclerosis (HCC)   Relevant Orders   CBC with Differential/Platelet   Comprehensive metabolic panel   TSH     Genitourinary   Bacterial vaginosis    + clue cells and negative yeast.  Script for Flagyl gel sent.  Discussed vaginal care.  Return for ongoing or continued symptoms.        Other   Vitamin D deficiency    History of low level and would like rechecked.  Vit D order placed.      Relevant Orders   VITAMIN D 25 Hydroxy (Vit-D Deficiency, Fractures)   Dysuria - Primary    UA returned 2+ LEU, trace blood, and many bacteria.  Wet prep with + clue cells.  Due to past hospitalization for UTI and symptoms will send script for Macrobid to cover UTI.  Await culture and change treatment as needed.  Return for worsening or continued symptoms.      Relevant Orders   UA/M w/rflx Culture, Routine   WET PREP FOR TRICH, YEAST, CLUE       Follow up plan: Return if symptoms worsen or fail to improve.

## 2018-07-23 NOTE — Assessment & Plan Note (Signed)
History of low level and would like rechecked.  Vit D order placed.

## 2018-07-23 NOTE — Assessment & Plan Note (Signed)
UA returned 2+ LEU, trace blood, and many bacteria.  Wet prep with + clue cells.  Due to past hospitalization for UTI and symptoms will send script for Macrobid to cover UTI.  Await culture and change treatment as needed.  Return for worsening or continued symptoms.

## 2018-07-24 ENCOUNTER — Other Ambulatory Visit: Payer: Self-pay | Admitting: Nurse Practitioner

## 2018-07-24 DIAGNOSIS — E559 Vitamin D deficiency, unspecified: Secondary | ICD-10-CM

## 2018-07-24 LAB — CBC WITH DIFFERENTIAL/PLATELET
Basophils Absolute: 0.1 10*3/uL (ref 0.0–0.2)
Basos: 1 %
EOS (ABSOLUTE): 0.3 10*3/uL (ref 0.0–0.4)
Eos: 5 %
HEMATOCRIT: 42 % (ref 34.0–46.6)
Hemoglobin: 14.6 g/dL (ref 11.1–15.9)
Immature Grans (Abs): 0 10*3/uL (ref 0.0–0.1)
Immature Granulocytes: 1 %
Lymphocytes Absolute: 1.8 10*3/uL (ref 0.7–3.1)
Lymphs: 30 %
MCH: 31.5 pg (ref 26.6–33.0)
MCHC: 34.8 g/dL (ref 31.5–35.7)
MCV: 91 fL (ref 79–97)
Monocytes Absolute: 0.4 10*3/uL (ref 0.1–0.9)
Monocytes: 7 %
Neutrophils Absolute: 3.4 10*3/uL (ref 1.4–7.0)
Neutrophils: 56 %
Platelets: 344 10*3/uL (ref 150–450)
RBC: 4.64 x10E6/uL (ref 3.77–5.28)
RDW: 12.8 % (ref 11.7–15.4)
WBC: 5.9 10*3/uL (ref 3.4–10.8)

## 2018-07-24 LAB — VITAMIN D 25 HYDROXY (VIT D DEFICIENCY, FRACTURES): Vit D, 25-Hydroxy: 21.8 ng/mL — ABNORMAL LOW (ref 30.0–100.0)

## 2018-07-24 LAB — COMPREHENSIVE METABOLIC PANEL
A/G RATIO: 2.2 (ref 1.2–2.2)
ALT: 21 IU/L (ref 0–32)
AST: 25 IU/L (ref 0–40)
Albumin: 4.6 g/dL (ref 3.9–5.0)
Alkaline Phosphatase: 68 IU/L (ref 39–117)
BUN/Creatinine Ratio: 18 (ref 9–23)
BUN: 13 mg/dL (ref 6–20)
Bilirubin Total: 0.6 mg/dL (ref 0.0–1.2)
CO2: 20 mmol/L (ref 20–29)
Calcium: 9.6 mg/dL (ref 8.7–10.2)
Chloride: 103 mmol/L (ref 96–106)
Creatinine, Ser: 0.74 mg/dL (ref 0.57–1.00)
GFR calc Af Amer: 134 mL/min/{1.73_m2} (ref >59)
GFR calc non Af Amer: 116 mL/min/{1.73_m2} (ref >59)
Globulin, Total: 2.1 g/dL (ref 1.5–4.5)
Glucose: 100 mg/dL — ABNORMAL HIGH (ref 65–99)
Potassium: 4.4 mmol/L (ref 3.5–5.2)
SODIUM: 138 mmol/L (ref 134–144)
Total Protein: 6.7 g/dL (ref 6.0–8.5)

## 2018-07-24 LAB — TSH: TSH: 1.33 u[IU]/mL (ref 0.450–4.500)

## 2018-07-24 MED ORDER — CHOLECALCIFEROL 1.25 MG (50000 UT) PO CAPS: 50000.0000 [IU] | ORAL_CAPSULE | ORAL | 0 refills | Status: DC

## 2018-07-24 NOTE — Progress Notes (Signed)
Vitamin D level 21.4.  Script for Vitamin D sent and will recheck level in 8 weeks.

## 2018-07-25 ENCOUNTER — Other Ambulatory Visit: Payer: Self-pay | Admitting: Nurse Practitioner

## 2018-07-25 LAB — UA/M W/RFLX CULTURE, ROUTINE
BILIRUBIN UA: NEGATIVE
GLUCOSE, UA: NEGATIVE
Ketones, UA: NEGATIVE
Nitrite, UA: NEGATIVE
Protein, UA: NEGATIVE
Specific Gravity, UA: 1.01 (ref 1.005–1.030)
Urobilinogen, Ur: 0.2 mg/dL (ref 0.2–1.0)
pH, UA: 7 (ref 5.0–7.5)

## 2018-07-25 LAB — MICROSCOPIC EXAMINATION: WBC, UA: >30 /hpf — AB (ref 0–5)

## 2018-07-25 LAB — URINE CULTURE, REFLEX

## 2018-07-25 MED ORDER — CEPHALEXIN 250 MG PO CAPS: 250.0000 mg | ORAL_CAPSULE | Freq: Three times a day (TID) | ORAL | 0 refills | Status: AC

## 2018-07-25 NOTE — Progress Notes (Signed)
Spoke to patient mother via telephone, on DPR, and updated on need for abx change due to return of C&S noting Group B Strep on UTI >100, 000.  Patient previously, a few months ago, took Keflex for UTI with no allergic reaction and successful treatment.  Will switch to this.  Script sent.

## 2018-07-31 ENCOUNTER — Telehealth: Payer: Self-pay | Admitting: Nurse Practitioner

## 2018-07-31 ENCOUNTER — Other Ambulatory Visit: Payer: Self-pay | Admitting: Nurse Practitioner

## 2018-07-31 MED ORDER — METRONIDAZOLE 500 MG PO TABS: 500.0000 mg | ORAL_TABLET | Freq: Two times a day (BID) | ORAL | 0 refills | Status: DC

## 2018-07-31 MED ORDER — FLUCONAZOLE 150 MG PO TABS: 150.0000 mg | ORAL_TABLET | Freq: Once | ORAL | 0 refills | Status: AC

## 2018-07-31 NOTE — Telephone Encounter (Signed)
LVM for patient that script is ready at [facility]

## 2018-07-31 NOTE — Telephone Encounter (Signed)
Requested medication (s) are due for refill today: No   Requested medication (s) are on the active medication list: No  Last refill:  07/23/18  Future visit scheduled: No  Notes to clinic:  Medication not on med list    Requested Prescriptions  Pending Prescriptions Disp Refills   metroNIDAZOLE (METROGEL) 0.75 % vaginal gel [Pharmacy Med Name: METRONIDAZOLE VAGINAL 0.75% GL] 70 g 0    Sig: Place 1 Applicatorful vaginally at bedtime for 5 days.     Off-Protocol Failed - 07/31/2018  2:52 PM      Failed - Medication not assigned to a protocol, review manually.      Passed - Valid encounter within last 12 months    Recent Outpatient Visits          1 week ago Dysuria   [facility] [place], Dorie Rank, NP   2 months ago Yeast infection of the vagina   [facility], [place] T, NP   3 months ago Bacterial vaginosis   [facility] [place], [place] T, NP   4 months ago Vaginal discharge   [facility] Gabriel Cirri, NP   3 years ago Immunization due   [facility] Gabriel Cirri, NP

## 2018-07-31 NOTE — Telephone Encounter (Signed)
Patient reports continued irritation to vaginal area and is requesting oral Flagyl vs the gel.  Will send in oral Flagyl and Diflucan.  She is to return to office if continued issues with discharge and irritation.

## 2018-07-31 NOTE — Telephone Encounter (Signed)
Copied from CRM [id number]. Topic: Quick Communication - See Telephone Encounter >> Jul 31, 2018  2:55 PM Burchel, Abbi R wrote: CRM for notification. See Telephone encounter for: 07/31/18.  Pt's mother states pt would like a call from [facility] re: medication   [phone]

## 2018-08-06 ENCOUNTER — Other Ambulatory Visit: Payer: Self-pay | Admitting: Neurology

## 2018-08-06 DIAGNOSIS — G35 Multiple sclerosis: Secondary | ICD-10-CM

## 2018-08-08 ENCOUNTER — Telehealth: Payer: Self-pay | Admitting: Nurse Practitioner

## 2018-08-08 ENCOUNTER — Other Ambulatory Visit: Payer: Self-pay | Admitting: Nurse Practitioner

## 2018-08-08 MED ORDER — FLUCONAZOLE 150 MG PO TABS: ORAL_TABLET | ORAL | 0 refills | Status: DC

## 2018-08-08 NOTE — Progress Notes (Signed)
Patient reports continued cottage cheese like discharge.  Will send in Fluconazole 150 MG every 72 hours x 14 days due to recurrent yeast infections, followed by one tablet once weekly for 6 months.   If no improvement, she has been told to call and come in when I am in office next week.

## 2018-08-08 NOTE — Telephone Encounter (Signed)
Spoke with patient via telephone

## 2018-08-08 NOTE — Telephone Encounter (Signed)
Copied from CRM [id number]. Topic: Quick Communication - See Telephone Encounter >> Aug 08, 2018 10:15 AM Baldo Daub L wrote: CRM for notification. See Telephone encounter for: 08/08/18.  Pt called to state that she is still having the same problem with vaginal discharge and she has taken all of the cream that was prescribed for her.  Pt states she still has thick cottage cheese type discharge but no smell to it.  Pt wants to know what she can do.

## 2018-08-10 IMAGING — US US ABDOMEN COMPLETE
1 series · 14 of 25 positions shown · non-contrast
Comparison: CT scan of March 01, 2017.

CLINICAL DATA: Elevated bilirubin level.

EXAM:
ABDOMEN ULTRASOUND COMPLETE

[Series 1: us abdomen complete · 0.15mm/px · 14 of 125 slices shown]
[im 1/125]
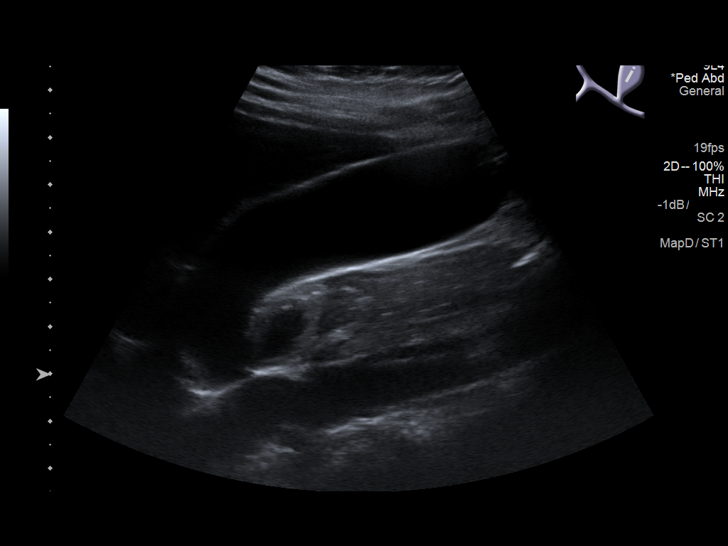
[im 11/125]
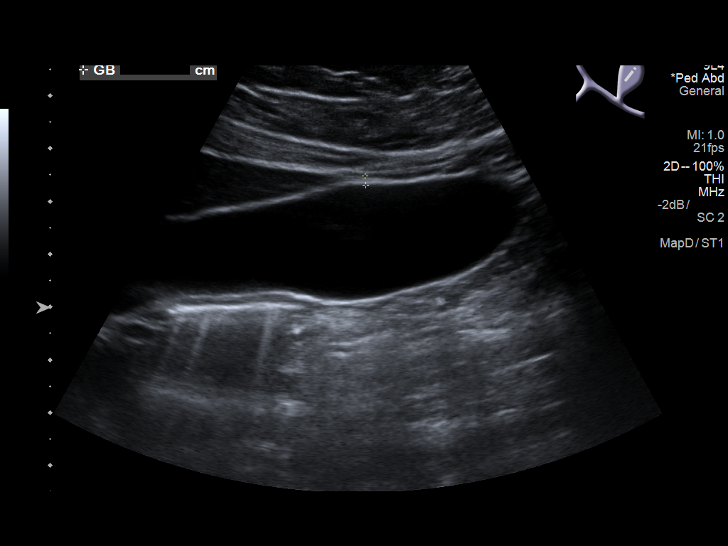
[im 21/125]
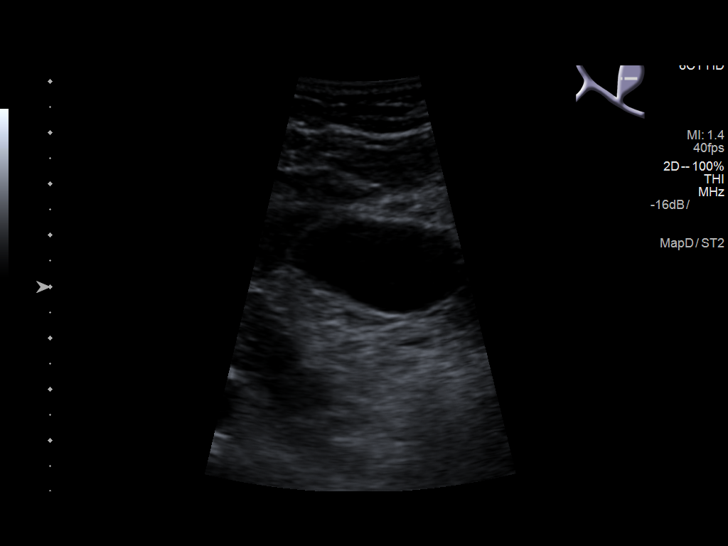
[im 32/125]
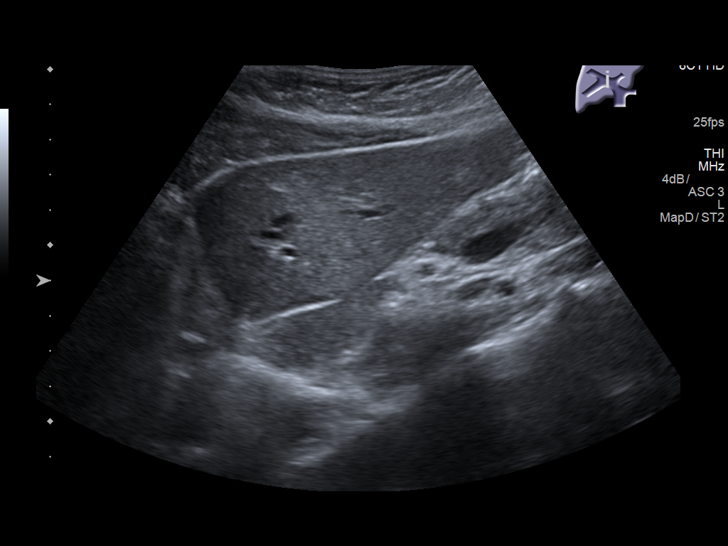
[im 42/125]
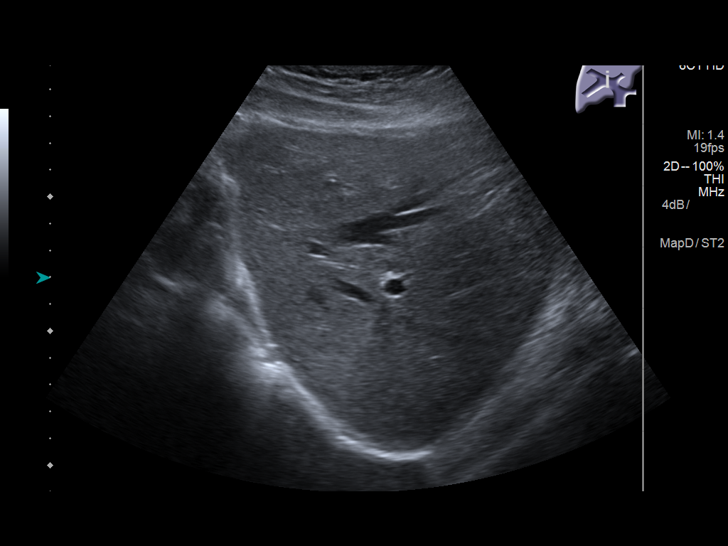
[im 47/125]
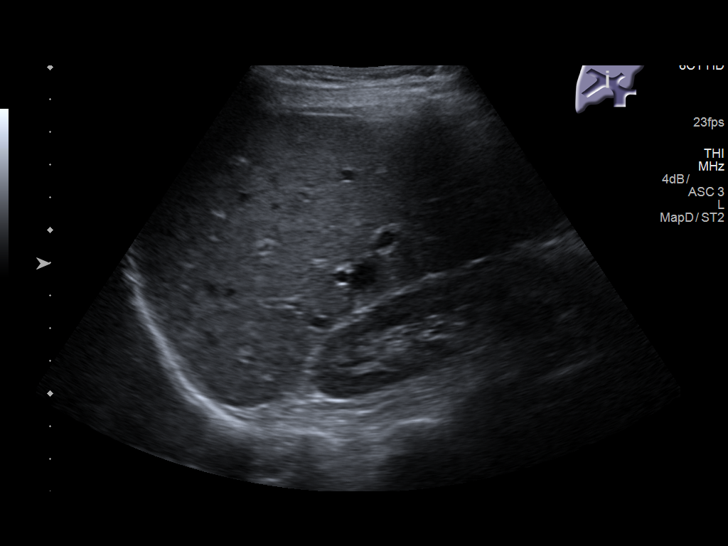
[im 57/125]
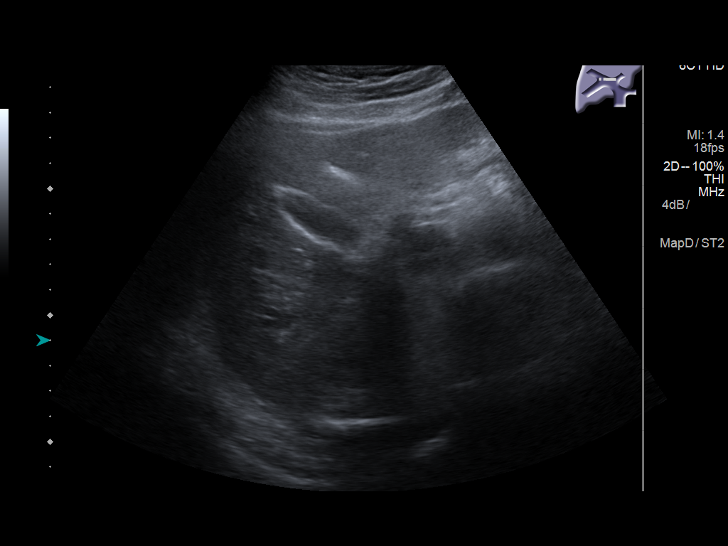
[im 68/125]
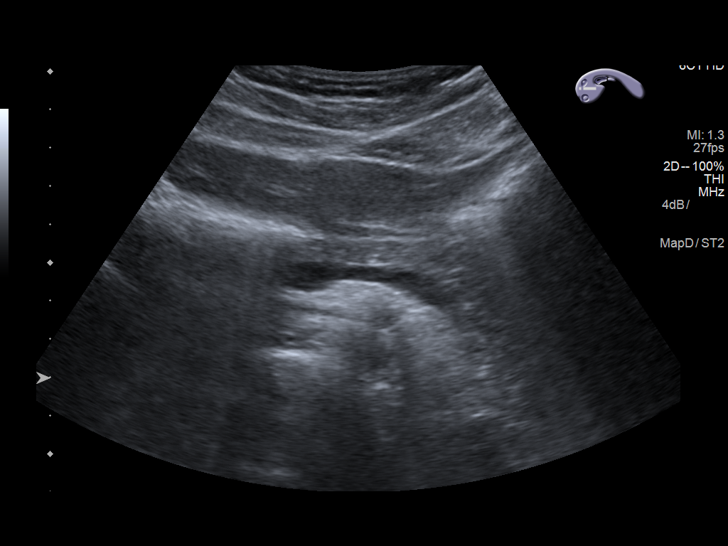
[im 78/125]
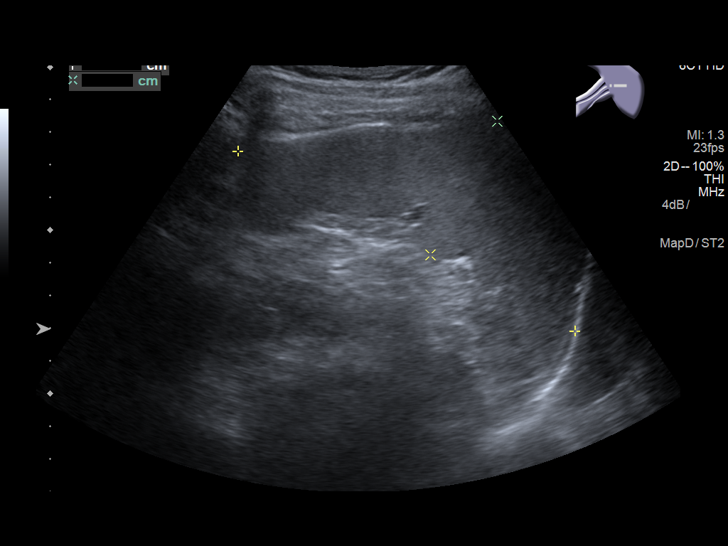
[im 83/125]
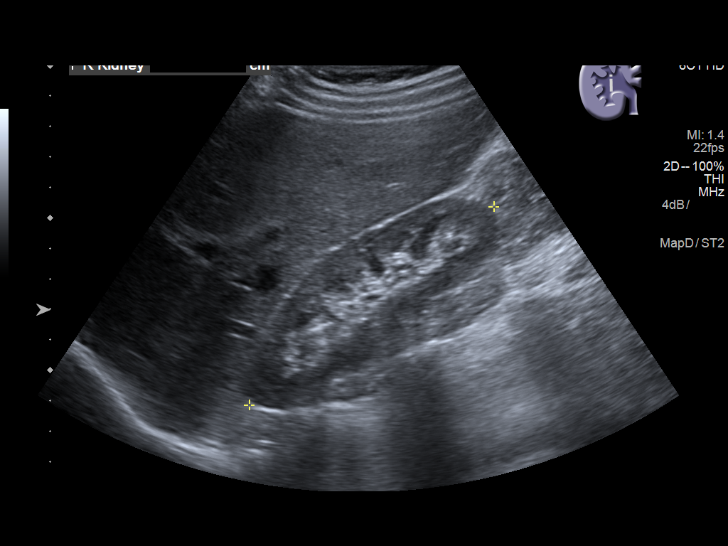
[im 94/125]
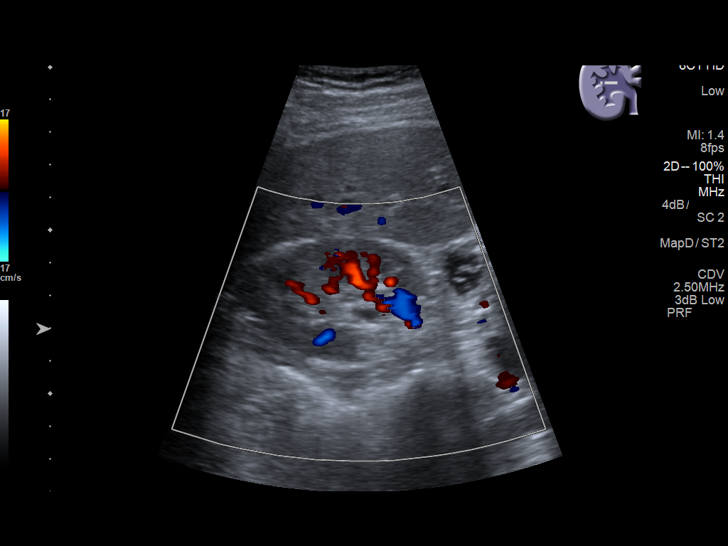
[im 104/125]
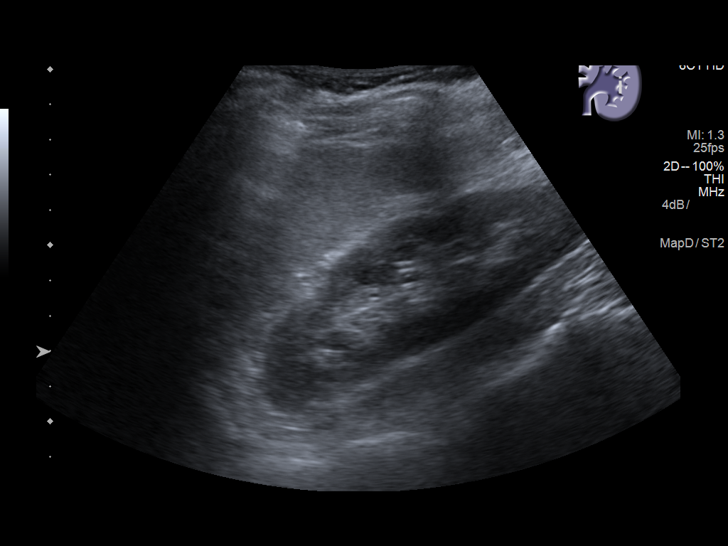
[im 114/125]
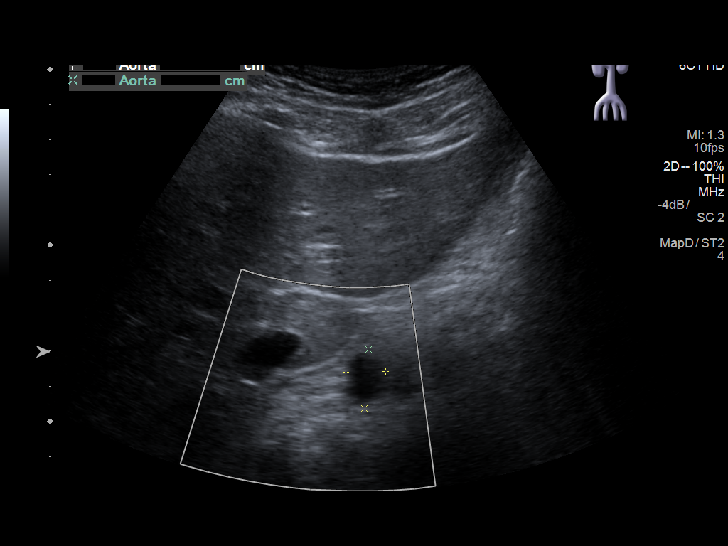
[im 125/125]
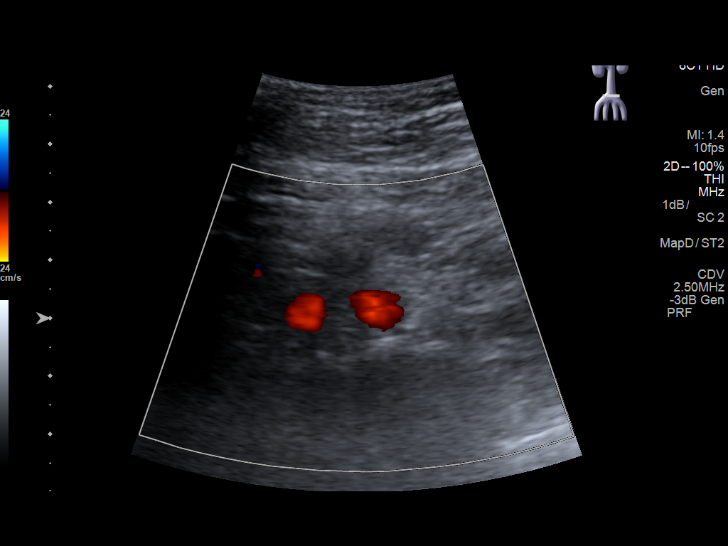

[14 of 25 positions shown; findings below may reference images not displayed]

FINDINGS: Gallbladder: No gallstones or wall thickening visualized. No
sonographic Murphy sign noted by sonographer.

Common bile duct: Diameter: 3 mm which is within normal limits.

Liver: No focal lesion identified. Within normal limits in
parenchymal echogenicity. Portal vein is patent on color Doppler
imaging with normal direction of blood flow towards the liver.

IVC: No abnormality visualized.

Pancreas: Visualized portion unremarkable.

Spleen: Size and appearance within normal limits.

Right Kidney: Length: 10.3 cm. Echogenicity within normal limits. No
mass or hydronephrosis visualized.

Left Kidney: Length: 10.7 cm. Echogenicity within normal limits. No
mass or hydronephrosis visualized.

Abdominal aorta: No aneurysm visualized.

Other findings: None.
IMPRESSION: No definite abnormality seen in the abdomen.

## 2018-08-12 ENCOUNTER — Telehealth: Payer: Self-pay

## 2018-08-12 NOTE — Telephone Encounter (Signed)
Prior Authorization initiated via [employer] for Clindamycin Cream 2 %  Confirmation #: [id number] W

## 2018-08-13 ENCOUNTER — Ambulatory Visit
Admission: RE | Admit: 2018-08-13 | Discharge: 2018-08-13 | Disposition: A | Discharge status: Home / Self Care | Payer: Medicaid Other | Source: Ambulatory Visit | Attending: Neurology | Admitting: Neurology

## 2018-08-13 ENCOUNTER — Other Ambulatory Visit: Payer: Self-pay

## 2018-08-13 ENCOUNTER — Ambulatory Visit (INDEPENDENT_AMBULATORY_CARE_PROVIDER_SITE_OTHER): Payer: Medicaid Other | Admitting: Nurse Practitioner

## 2018-08-13 ENCOUNTER — Encounter: Payer: Self-pay | Admitting: Nurse Practitioner

## 2018-08-13 DIAGNOSIS — G35 Multiple sclerosis: Secondary | ICD-10-CM | POA: Diagnosis not present

## 2018-08-13 DIAGNOSIS — R350 Frequency of micturition: Secondary | ICD-10-CM | POA: Diagnosis not present

## 2018-08-13 DIAGNOSIS — N760 Acute vaginitis: Secondary | ICD-10-CM

## 2018-08-13 DIAGNOSIS — H547 Unspecified visual loss: Secondary | ICD-10-CM | POA: Diagnosis not present

## 2018-08-13 DIAGNOSIS — B9689 Other specified bacterial agents as the cause of diseases classified elsewhere: Secondary | ICD-10-CM

## 2018-08-13 MED ORDER — NITROFURANTOIN MONOHYD MACRO 100 MG PO CAPS: 100.0000 mg | ORAL_CAPSULE | ORAL | 1 refills | Status: DC | PRN

## 2018-08-13 MED ORDER — METRONIDAZOLE 500 MG PO TABS: 500.0000 mg | ORAL_TABLET | Freq: Two times a day (BID) | ORAL | 0 refills | Status: AC

## 2018-08-13 MED ORDER — GADOBUTROL 1 MMOL/ML IV SOLN
5.0000 mL | Freq: Once | INTRAVENOUS | Status: AC | PRN
  Administered 2018-08-13: 5 mL via INTRAVENOUS

## 2018-08-13 NOTE — Assessment & Plan Note (Signed)
Recurrent in patient receiving treatment for MS.  Currently on Diflucan extended period for recurrent yeast.  Script for oral Flagyl sent to pharmacy, along with Macrobid to take postcoitus to assist in preventing recurrent UTI.  Recommended use of daily probiotic tablet and increasing oral fluid intake.  Discussed proper hygiene techniques postcoitus, for both partner and her, including safe sex discussion.  She is to come into office tomorrow to provide urine sample and will also obtain urine for GC/Chlam.  If continued issues will placed referral to GYN for further recommendations.

## 2018-08-13 NOTE — Patient Instructions (Signed)

## 2018-08-13 NOTE — Progress Notes (Signed)
There were no vitals taken for this visit.   Subjective:    Patient ID: Jennifer Watts, female    DOB: [birth year], 23 y.o.   MRN: [id number]  HPI: Jennifer Watts is a 23 y.o. female  Chief Complaint  Patient presents with  . Follow-up    pt states she is still having the same vaginal issues    . This visit was completed via telephone due to the restrictions of the COVID-19 pandemic. All issues as above were discussed and addressed but no physical exam was performed. If it was felt that the patient should be evaluated in the office, they were directed there. The patient verbally consented to this visit. Patient was unable to complete an audio/visual visit due to Technical difficulties,Lack of internet. Due to the catastrophic nature of the COVID-19 pandemic, this visit was done through audio contact only. . Location of the patient: home . Location of the provider: home . Those involved with this call:  . Provider: Aura Dials, DNP, AGPCNP-C . CMA: Wilhemena Durie, CMA . Front Desk/Registration: Adela Ports  . Time spent on call: 20 minutes on the phone discussing health concerns   VAGINAL DISCHARGE FOLLOW-UP: Ongoing issue in this 23 yo female with multiple sclerosis, receiving treatment every 6 months.  Has had recurrent vaginal infections starting 02/21/18 with bacterial vaginosis and yeast infections.  Had one hospitalization for sepsis with UTI 04/30/18, treated with Keflex.  Her last UTI treated outpatient was for Group B Strep on 07/25/18, treated with Keflex which it was susceptible to.  Is sexually active.  Does not use condoms, but reports practicing safe sex with good hygiene.  She is currently on extended Diflucan treatment for recurrent infections, but reports her current discharge has an odor and reminds her more of her BV discharge.  States the discharge no longer looks like "cottage cheese" and is no longer "itchy" with the Diflucan.  She has done better with  oral Flagyl for these in past versus vaginal gel.  Jennifer Watts does endorse some urinary frequency at this time, but no abdominal pain, fever, dysuria, hematuria, or urgency.  She does agree to come in morning to provide urine sample due to her history with UTIs.  Discussed with her sending script for Macrobid which she could take post-sexual intercourse as she reports increased symptoms noted in past after sex.  Educated her on how this would be taken. Duration: days Discharge description: yellow  Pruritus: no Dysuria: no Malodorous: often worse after sex Urinary frequency: yes Fevers: no Abdominal pain: no  Sexual activity: monogamous, boyfriend is circumcised History of sexually transmitted diseases: no Recent antibiotic use: yes Context: recurrent yeast infections and recurrent BV  Treatments attempted: antifungal   Relevant past medical, surgical, family and social history reviewed and updated as indicated. Interim medical history since our last visit reviewed. Allergies and medications reviewed and updated.  Review of Systems  Constitutional: Negative for activity change, appetite change, diaphoresis, fatigue and fever.  Respiratory: Negative for cough, chest tightness and shortness of breath.   Cardiovascular: Negative for chest pain, palpitations and leg swelling.  Gastrointestinal: Negative for abdominal distention, abdominal pain, constipation, diarrhea, nausea and vomiting.  Endocrine: Negative for cold intolerance, heat intolerance, polydipsia, polyphagia and polyuria.  Genitourinary: Positive for frequency and vaginal discharge. Negative for decreased urine volume, difficulty urinating, dyspareunia, dysuria, genital sores, hematuria, urgency and vaginal pain.  Neurological: Negative for dizziness, syncope, weakness, light-headedness, numbness and headaches.  Psychiatric/Behavioral: Negative.  Per HPI unless specifically indicated above     Objective:    There were no  vitals taken for this visit.  Wt Readings from Last 3 Encounters:  07/23/18 114 lb 2 oz (51.8 kg)  05/10/18 100 lb 4.8 oz (45.5 kg)  05/06/18 100 lb (45.4 kg)    Physical Exam   Unable to obtain due to patient having no visual access at this time.    Results for orders placed or performed in visit on 07/23/18  WET PREP FOR TRICH, YEAST, CLUE  Result Value Ref Range   Trichomonas Exam Negative Negative   Yeast Exam Negative Negative   Clue Cell Exam Positive (A) Negative  Microscopic Examination  Result Value Ref Range   WBC, UA >30 (A) 0 - 5 /hpf   RBC, UA 0-2 0 - 2 /hpf   Epithelial Cells (non renal) 0-10 0 - 10 /hpf   Bacteria, UA Many (A) None seen/Few  Urine Culture, Reflex  Result Value Ref Range   Urine Culture, Routine Final report (A)    Organism ID, Bacteria Comment (A)   UA/M w/rflx Culture, Routine  Result Value Ref Range   Specific Gravity, UA 1.010 1.005 - 1.030   pH, UA 7.0 5.0 - 7.5   Color, UA Yellow Yellow   Appearance Ur Hazy (A) Clear   Leukocytes, UA 2+ (A) Negative   Protein, UA Negative Negative/Trace   Glucose, UA Negative Negative   Ketones, UA Negative Negative   RBC, UA Trace (A) Negative   Bilirubin, UA Negative Negative   Urobilinogen, Ur 0.2 0.2 - 1.0 mg/dL   Nitrite, UA Negative Negative   Microscopic Examination See below:    Urinalysis Reflex Comment   CBC with Differential/Platelet  Result Value Ref Range   WBC 5.9 3.4 - 10.8 x10E3/uL   RBC 4.64 3.77 - 5.28 x10E6/uL   Hemoglobin 14.6 11.1 - 15.9 g/dL   Hematocrit 74.6 00.2 - 46.6 %   MCV 91 79 - 97 fL   MCH 31.5 26.6 - 33.0 pg   MCHC 34.8 31.5 - 35.7 g/dL   RDW 98.4 73.0 - 85.6 %   Platelets 344 150 - 450 x10E3/uL   Neutrophils 56 Not Estab. %   Lymphs 30 Not Estab. %   Monocytes 7 Not Estab. %   Eos 5 Not Estab. %   Basos 1 Not Estab. %   Neutrophils Absolute 3.4 1.4 - 7.0 x10E3/uL   Lymphocytes Absolute 1.8 0.7 - 3.1 x10E3/uL   Monocytes Absolute 0.4 0.1 - 0.9 x10E3/uL    EOS (ABSOLUTE) 0.3 0.0 - 0.4 x10E3/uL   Basophils Absolute 0.1 0.0 - 0.2 x10E3/uL   Immature Granulocytes 1 Not Estab. %   Immature Grans (Abs) 0.0 0.0 - 0.1 x10E3/uL  Comprehensive metabolic panel  Result Value Ref Range   Glucose 100 (H) 65 - 99 mg/dL   BUN 13 6 - 20 mg/dL   Creatinine, Ser 9.43 0.57 - 1.00 mg/dL   GFR calc non Af Amer 116 >59 mL/min/1.73   GFR calc Af Amer 134 >59 mL/min/1.73   BUN/Creatinine Ratio 18 9 - 23   Sodium 138 134 - 144 mmol/L   Potassium 4.4 3.5 - 5.2 mmol/L   Chloride 103 96 - 106 mmol/L   CO2 20 20 - 29 mmol/L   Calcium 9.6 8.7 - 10.2 mg/dL   Total Protein 6.7 6.0 - 8.5 g/dL   Albumin 4.6 3.9 - 5.0 g/dL   Globulin, Total 2.1 1.5 -  4.5 g/dL   Albumin/Globulin Ratio 2.2 1.2 - 2.2   Bilirubin Total 0.6 0.0 - 1.2 mg/dL   Alkaline Phosphatase 68 39 - 117 IU/L   AST 25 0 - 40 IU/L   ALT 21 0 - 32 IU/L  VITAMIN D 25 Hydroxy (Vit-D Deficiency, Fractures)  Result Value Ref Range   Vit D, 25-Hydroxy 21.8 (L) 30.0 - 100.0 ng/mL  TSH  Result Value Ref Range   TSH 1.330 0.450 - 4.500 uIU/mL      Assessment & Plan:   Problem List Items Addressed This Visit      Genitourinary   Bacterial vaginosis - Primary    Recurrent in patient receiving treatment for MS.  Currently on Diflucan extended period for recurrent yeast.  Script for oral Flagyl sent to pharmacy, along with Macrobid to take postcoitus to assist in preventing recurrent UTI.  Recommended use of daily probiotic tablet and increasing oral fluid intake.  Discussed proper hygiene techniques postcoitus, for both partner and her, including safe sex discussion.  She is to come into office tomorrow to provide urine sample and will also obtain urine for GC/Chlam.  If continued issues will placed referral to GYN for further recommendations.        Relevant Medications   metroNIDAZOLE (FLAGYL) 500 MG tablet    Other Visit Diagnoses    Urinary frequency       To come in office in morning to provide  urine sample and will also obtain GC/Chlam urine sample.   Relevant Orders   UA/M w/rflx Culture, Routine   GC/Chlamydia Probe Amp      I discussed the assessment and treatment plan with the patient. The patient was provided an opportunity to ask questions and all were answered. The patient agreed with the plan and demonstrated an understanding of the instructions.  The patient was advised to call back or seek an in-person evaluation if the symptoms worsen or if the condition fails to improve as anticipated.  I provided 20 minutes of time during this encounter.  Follow up plan: Return if symptoms worsen or fail to improve.

## 2018-08-14 ENCOUNTER — Other Ambulatory Visit: Payer: Medicaid Other

## 2018-08-14 DIAGNOSIS — R350 Frequency of micturition: Secondary | ICD-10-CM | POA: Diagnosis not present

## 2018-08-14 NOTE — Telephone Encounter (Signed)
PA was approved. 

## 2018-08-15 DIAGNOSIS — E559 Vitamin D deficiency, unspecified: Secondary | ICD-10-CM | POA: Diagnosis not present

## 2018-08-15 DIAGNOSIS — G35 Multiple sclerosis: Secondary | ICD-10-CM | POA: Diagnosis not present

## 2018-08-15 LAB — GC/CHLAMYDIA PROBE AMP
Chlamydia trachomatis, NAA: NEGATIVE
Neisseria Gonorrhoeae by PCR: NEGATIVE

## 2018-08-16 ENCOUNTER — Telehealth: Payer: Self-pay | Admitting: Nurse Practitioner

## 2018-08-16 LAB — MICROSCOPIC EXAMINATION: RBC, Urine: NONE SEEN /hpf (ref 0–2)

## 2018-08-16 LAB — UA/M W/RFLX CULTURE, ROUTINE
Bilirubin, UA: NEGATIVE
Glucose, UA: NEGATIVE
Ketones, UA: NEGATIVE
Nitrite, UA: NEGATIVE
Protein,UA: NEGATIVE
RBC, UA: NEGATIVE
Specific Gravity, UA: 1.02 (ref 1.005–1.030)
Urobilinogen, Ur: 0.2 mg/dL (ref 0.2–1.0)
pH, UA: 6 (ref 5.0–7.5)

## 2018-08-16 LAB — URINE CULTURE, REFLEX

## 2018-08-16 MED ORDER — CEPHALEXIN 250 MG PO CAPS: 250.0000 mg | ORAL_CAPSULE | Freq: Three times a day (TID) | ORAL | 0 refills | Status: DC

## 2018-08-16 NOTE — Telephone Encounter (Signed)
Spoke to [employer] via telephone.  Initial report for growth showed 10, 000 to 25, 000.  Final report noting >100, 000 Group B Strep susceptible to PCN and Ampicillin.  Patient allergic to PCN.  Patient previously has taken Keflex for UTI with no allergic reaction and successful treatment.  Script sent.  She reports she was told on her recent MRI she had more lesions with her MS and is supposed to go in next week for treatment.  She is unsure if she wants to do this d/t her continued issues with vaginal and urine infections.  Advised her to speak with neurology team about this.

## 2018-08-22 ENCOUNTER — Telehealth: Payer: Self-pay | Admitting: Nurse Practitioner

## 2018-08-22 NOTE — Telephone Encounter (Signed)
Would need face to face visit and possible GYN referral for ongoing recurrence.

## 2018-08-22 NOTE — Telephone Encounter (Signed)
Pt scheduled for a face to face.

## 2018-08-22 NOTE — Telephone Encounter (Signed)
Copied from CRM [id number]. Topic: General - Other >> Aug 22, 2018  2:33 PM Jennifer Watts wrote: Reason for CRM: Patient ask for Watts call from [facility] said that she is done with ATBs but still having some issues. Please advise Ph# [phone]

## 2018-08-23 ENCOUNTER — Other Ambulatory Visit: Payer: Self-pay

## 2018-08-23 ENCOUNTER — Ambulatory Visit (INDEPENDENT_AMBULATORY_CARE_PROVIDER_SITE_OTHER): Payer: Medicaid Other | Admitting: Nurse Practitioner

## 2018-08-23 ENCOUNTER — Ambulatory Visit: Payer: Medicaid Other | Admitting: Nurse Practitioner

## 2018-08-23 ENCOUNTER — Encounter: Payer: Self-pay | Admitting: Nurse Practitioner

## 2018-08-23 VITALS — BP 128/84 | HR 82 | Temp 98.8°F | Wt 114.0 lb

## 2018-08-23 DIAGNOSIS — B9689 Other specified bacterial agents as the cause of diseases classified elsewhere: Secondary | ICD-10-CM | POA: Diagnosis not present

## 2018-08-23 DIAGNOSIS — N760 Acute vaginitis: Secondary | ICD-10-CM

## 2018-08-23 LAB — WET PREP FOR TRICH, YEAST, CLUE
Clue Cell Exam: POSITIVE — AB
Trichomonas Exam: NEGATIVE
Yeast Exam: NEGATIVE

## 2018-08-23 MED ORDER — METRONIDAZOLE 0.75 % EX GEL: 1.0000 "application " | Freq: Every day | CUTANEOUS | 2 refills | Status: DC

## 2018-08-23 NOTE — Progress Notes (Signed)
BP 128/84 (BP Location: Left Arm)   Pulse 82 Comment: apical  Temp 98.8 F (37.1 C) (Oral)   Wt 114 lb (51.7 kg)   SpO2 98%   BMI 19.20 kg/m    Subjective:    Patient ID: Jennifer Watts, female    DOB: 05/30/1995, 23 y.o.   MRN: [id number]  HPI: Jennifer Watts is a 23 y.o. female  Chief Complaint  Patient presents with  . Vaginal Itching   VAGINAL DISCHARGE Ongoing issue in this female with multiple sclerosis, receiving treatment every 6 months.  Has had recurrent vaginal infections starting 02/21/18 with bacterial vaginosis and yeast infections.  Had one hospitalization for sepsis with UTI 04/30/18, treated with Keflex.  Her last UTI treated outpatient was for Group B Strep on 08/16/2018, treated with Keflex which it was susceptible to.  Is sexually active. Does not use condoms, but reports practicing safe sex with good hygiene.  She is currently on extended Diflucan treatment for recurrent infections.  Was recently treated with oral Flagyl for BV symptoms, helped during treatment period and then symptoms returned. Recent urine GC/Chlam negative.  She reports she has an MS treatment scheduled coming up, but is considering holding off on it which "my neurologist said I could".  She reports that her neurologist is having her talk to neurologist at [facility] this upcoming week about treatment options and she is going to discuss the recurrent vaginal infections with them and express her concerns about treatments.  Had recent MRI which noted more lesions, but she reports she has no symptoms at this time. Duration: days Discharge description: yellow  Pruritus: yes Dysuria: no Malodorous: yes Urinary frequency: no Fevers: no Abdominal pain: no  Sexual activity: monogamous History of sexually transmitted diseases: no Recent antibiotic use: yes Context: recurrent yeast infections and recurrent BV  Treatments attempted: noted above  Relevant past medical, surgical, family and social history  reviewed and updated as indicated. Interim medical history since our last visit reviewed. Allergies and medications reviewed and updated.  Review of Systems  Constitutional: Negative for activity change, appetite change, diaphoresis, fatigue and fever.  Respiratory: Negative for cough, chest tightness and shortness of breath.   Cardiovascular: Negative for chest pain, palpitations and leg swelling.  Gastrointestinal: Negative for abdominal distention, abdominal pain, constipation, diarrhea, nausea and vomiting.  Endocrine: Negative for cold intolerance, heat intolerance, polydipsia, polyphagia and polyuria.  Neurological: Negative for dizziness, syncope, weakness, light-headedness, numbness and headaches.  Psychiatric/Behavioral: Negative.     Per HPI unless specifically indicated above     Objective:    BP 128/84 (BP Location: Left Arm)   Pulse 82 Comment: apical  Temp 98.8 F (37.1 C) (Oral)   Wt 114 lb (51.7 kg)   SpO2 98%   BMI 19.20 kg/m   Wt Readings from Last 3 Encounters:  08/23/18 114 lb (51.7 kg)  07/23/18 114 lb 2 oz (51.8 kg)  05/10/18 100 lb 4.8 oz (45.5 kg)    Physical Exam Vitals signs and nursing note reviewed.  Constitutional:      General: She is awake.     Appearance: She is well-developed. She is not ill-appearing.  HENT:     Head: Normocephalic.  Eyes:     General:        Right eye: No discharge.        Left eye: No discharge.     Conjunctiva/sclera: Conjunctivae normal.     Pupils: Pupils are equal, round, and reactive to light.  Neck:     Musculoskeletal: Normal range of motion and neck supple.     Thyroid: No thyromegaly.     Vascular: No carotid bruit.  Cardiovascular:     Rate and Rhythm: Normal rate and regular rhythm.     Heart sounds: Normal heart sounds. No murmur. No gallop.   Pulmonary:     Effort: Pulmonary effort is normal.     Breath sounds: Normal breath sounds.  Abdominal:     General: Bowel sounds are normal.      Palpations: Abdomen is soft.  Genitourinary:    Comments: Deferred, patient wishes to self swab Musculoskeletal:     Right lower leg: No edema.     Left lower leg: No edema.  Lymphadenopathy:     Cervical: No cervical adenopathy.  Skin:    General: Skin is warm and dry.  Neurological:     Mental Status: She is alert and oriented to person, place, and time.  Psychiatric:        Attention and Perception: Attention normal.        Behavior: Behavior normal. Behavior is cooperative.        Thought Content: Thought content normal.        Judgment: Judgment normal.     Results for orders placed or performed in visit on 08/23/18  WET PREP FOR TRICH, YEAST, CLUE  Result Value Ref Range   Trichomonas Exam Negative Negative   Yeast Exam Negative Negative   Clue Cell Exam Positive (A) Negative      Assessment & Plan:   Problem List Items Addressed This Visit      Genitourinary   Bacterial vaginosis - Primary    Recurrent in patient with MS treatments.  Will place on Flagyl gel daily at this time.  Current wet prep returned positive for clue cells and negative for yeast (continues on extended Diflucan, which appears to be helping).  Discussed plan of care with patient.  Educated on using gel daily and proper hygiene techniques for vaginal health.  Consider referral to GYN if ongoing issues.  Return to office for continued or worsening issues.      Relevant Orders   WET PREP FOR TRICH, YEAST, CLUE (Completed)       Follow up plan: Return if symptoms worsen or fail to improve.

## 2018-08-23 NOTE — Patient Instructions (Signed)

## 2018-08-23 NOTE — Assessment & Plan Note (Signed)
Recurrent in patient with MS treatments.  Will place on Flagyl gel daily at this time.  Current wet prep returned positive for clue cells and negative for yeast (continues on extended Diflucan, which appears to be helping).  Discussed plan of care with patient.  Educated on using gel daily and proper hygiene techniques for vaginal health.  Consider referral to GYN if ongoing issues.  Return to office for continued or worsening issues.

## 2018-08-27 ENCOUNTER — Other Ambulatory Visit: Payer: Self-pay | Admitting: Nurse Practitioner

## 2018-08-27 MED ORDER — METRONIDAZOLE 0.75 % EX GEL: 1.0000 "application " | Freq: Every day | CUTANEOUS | 2 refills | Status: DC

## 2018-08-27 NOTE — Telephone Encounter (Signed)
Requested medication (s) are due for refill today:   Not sure if it is to be continued or only for 4-5 days.    Requested medication (s) are on the active medication list:   Yes  Future visit scheduled:   No  Just saw Aura Dials on 08/23/2018   Last ordered: 08/23/2018  45g   2 refills   Forwarded to provider for review.  Requested Prescriptions  Pending Prescriptions Disp Refills   metroNIDAZOLE (METROGEL) 0.75 % vaginal gel [Pharmacy Med Name: METRONIDAZOLE VAGINAL 0.75% GL] 70 g 0    Sig: PLACE 1 APPLICATORFUL VAGINALLY AT BEDTIME FOR 5 DAYS.     Off-Protocol Failed - 08/27/2018  2:55 PM      Failed - Medication not assigned to a protocol, review manually.      Passed - Valid encounter within last 12 months    Recent Outpatient Visits          4 days ago Bacterial vaginosis   [facility] [place], [place] T, NP   2 weeks ago Bacterial vaginosis   [facility] [place], Dorie Rank, NP   1 month ago Dysuria   [facility] [place], [place] T, NP   3 months ago Yeast infection of the vagina   [facility], [place] T, NP   4 months ago Bacterial vaginosis   [facility] [place], Dorie Rank, NP

## 2018-08-28 ENCOUNTER — Telehealth: Payer: Self-pay

## 2018-08-28 NOTE — Telephone Encounter (Signed)
Prior authorization for metroNIDAZOLe gel was initiated via [email or web address]. Confirmation # [id number]

## 2018-08-29 ENCOUNTER — Other Ambulatory Visit: Payer: Self-pay | Admitting: Nurse Practitioner

## 2018-08-29 NOTE — Telephone Encounter (Signed)
Requested medication (s) are due for refill today: ?   Requested medication (s) are on the active medication list: yes  Last refill: 07/31/2018  Future visit scheduled: no  Notes to clinic:  No protocol    Requested Prescriptions  Pending Prescriptions Disp Refills   metroNIDAZOLE (METROGEL) 0.75 % vaginal gel [Pharmacy Med Name: METRONIDAZOLE VAGINAL 0.75% GL] 70 g 0    Sig: PLACE 1 APPLICATORFUL VAGINALLY AT BEDTIME FOR 5 DAYS.     Off-Protocol Failed - 08/29/2018  1:11 PM      Failed - Medication not assigned to a protocol, review manually.      Passed - Valid encounter within last 12 months    Recent Outpatient Visits          6 days ago Bacterial vaginosis   [facility] [place], [place] T, NP   2 weeks ago Bacterial vaginosis   [facility] [place], Dorie Rank, NP   1 month ago Dysuria   [facility] [place], Cyril T, NP   3 months ago Yeast infection of the vagina   [facility], [place] T, NP   4 months ago Bacterial vaginosis   [facility] [place], Dorie Rank, NP

## 2018-08-29 NOTE — Telephone Encounter (Signed)
Do you mind looking into this in morning.  She has refills on the one ordered 08/27/2018.  Thanks.

## 2018-08-30 ENCOUNTER — Other Ambulatory Visit: Payer: Self-pay | Admitting: Nurse Practitioner

## 2018-08-30 MED ORDER — METRONIDAZOLE 0.75 % VA GEL: 1.0000 | Freq: Every day | VAGINAL | 3 refills | Status: DC

## 2018-08-30 NOTE — Telephone Encounter (Signed)
Please send another prescription for vaginal gel, it was sent over as topical, that is why they have not filled it.

## 2018-08-30 NOTE — Telephone Encounter (Signed)
Got it, done.

## 2018-10-03 ENCOUNTER — Other Ambulatory Visit: Payer: Self-pay | Admitting: Nurse Practitioner

## 2018-10-03 NOTE — Telephone Encounter (Signed)
Requested medication (s) are due for refill today: Yes  Requested medication (s) are on the active medication list: Yes  Last refill:  1 month ago  Future visit scheduled: No  Notes to clinic:  Not assigned to protocol, unable to refill     Requested Prescriptions  Pending Prescriptions Disp Refills   metroNIDAZOLE (METROGEL) 0.75 % vaginal gel [Pharmacy Med Name: METRONIDAZOLE VAGINAL 0.75% GL] 70 g 3    Sig: Place 1 Applicatorful vaginally at bedtime.     Off-Protocol Failed - 10/03/2018  5:33 PM      Failed - Medication not assigned to a protocol, review manually.      Passed - Valid encounter within last 12 months    Recent Outpatient Visits          1 month ago Bacterial vaginosis   [facility] [place], Garvin T, NP   1 month ago Bacterial vaginosis   [facility] [place], [place] T, NP   2 months ago Dysuria   [facility] [place], [place] T, NP   4 months ago Yeast infection of the vagina   [facility], [place] T, NP   5 months ago Bacterial vaginosis   [facility] [place], Dorie Rank, NP

## 2018-10-15 DIAGNOSIS — F329 Major depressive disorder, single episode, unspecified: Secondary | ICD-10-CM | POA: Diagnosis not present

## 2018-10-15 DIAGNOSIS — G35 Multiple sclerosis: Secondary | ICD-10-CM | POA: Diagnosis not present

## 2018-11-06 ENCOUNTER — Other Ambulatory Visit: Payer: Self-pay | Admitting: Nurse Practitioner

## 2018-11-06 NOTE — Telephone Encounter (Signed)
Ok to refill 

## 2018-11-18 DIAGNOSIS — G35 Multiple sclerosis: Secondary | ICD-10-CM | POA: Diagnosis not present

## 2018-11-18 DIAGNOSIS — F329 Major depressive disorder, single episode, unspecified: Secondary | ICD-10-CM | POA: Diagnosis not present

## 2018-11-18 DIAGNOSIS — E559 Vitamin D deficiency, unspecified: Secondary | ICD-10-CM | POA: Diagnosis not present

## 2018-12-03 ENCOUNTER — Telehealth: Payer: Self-pay | Admitting: Nurse Practitioner

## 2018-12-03 DIAGNOSIS — B9689 Other specified bacterial agents as the cause of diseases classified elsewhere: Secondary | ICD-10-CM

## 2018-12-03 NOTE — Telephone Encounter (Signed)
Copied from [place] [id number]. Topic: Referral - Request for Referral >> Dec 03, 2018 10:13 AM Lennox Solders wrote: Has patient seen PCP for this complaint? Yes. Pt needs another referral to [facility] the other referral expired. Pt  needs to for vaginal itching, pap and blood work. [facility] [phone]. Pt has medicaid

## 2018-12-05 ENCOUNTER — Other Ambulatory Visit: Payer: Self-pay | Admitting: Nurse Practitioner

## 2018-12-05 NOTE — Telephone Encounter (Signed)
Forwarding medication refill to PCP for review. 

## 2018-12-16 DIAGNOSIS — Z113 Encounter for screening for infections with a predominantly sexual mode of transmission: Secondary | ICD-10-CM | POA: Diagnosis not present

## 2018-12-16 DIAGNOSIS — Z01419 Encounter for gynecological examination (general) (routine) without abnormal findings: Secondary | ICD-10-CM | POA: Diagnosis not present

## 2018-12-16 DIAGNOSIS — B9689 Other specified bacterial agents as the cause of diseases classified elsewhere: Secondary | ICD-10-CM | POA: Diagnosis not present

## 2018-12-16 DIAGNOSIS — N760 Acute vaginitis: Secondary | ICD-10-CM | POA: Diagnosis not present

## 2018-12-16 DIAGNOSIS — R87610 Atypical squamous cells of undetermined significance on cytologic smear of cervix (ASC-US): Secondary | ICD-10-CM | POA: Diagnosis not present

## 2019-01-03 ENCOUNTER — Ambulatory Visit (INDEPENDENT_AMBULATORY_CARE_PROVIDER_SITE_OTHER): Payer: Medicaid Other | Admitting: Nurse Practitioner

## 2019-01-03 ENCOUNTER — Other Ambulatory Visit: Payer: Self-pay

## 2019-01-03 ENCOUNTER — Encounter: Payer: Self-pay | Admitting: Nurse Practitioner

## 2019-01-03 VITALS — BP 127/88 | HR 88 | Temp 99.2°F | Ht 64.5 in | Wt 128.8 lb

## 2019-01-03 DIAGNOSIS — F330 Major depressive disorder, recurrent, mild: Secondary | ICD-10-CM | POA: Diagnosis not present

## 2019-01-03 DIAGNOSIS — E559 Vitamin D deficiency, unspecified: Secondary | ICD-10-CM

## 2019-01-03 DIAGNOSIS — Z0000 Encounter for general adult medical examination without abnormal findings: Secondary | ICD-10-CM

## 2019-01-03 DIAGNOSIS — G43009 Migraine without aura, not intractable, without status migrainosus: Secondary | ICD-10-CM | POA: Diagnosis not present

## 2019-01-03 DIAGNOSIS — G35 Multiple sclerosis: Secondary | ICD-10-CM | POA: Diagnosis not present

## 2019-01-03 NOTE — Progress Notes (Signed)
BP 127/88 (BP Location: Left Arm, Cuff Size: Normal)   Pulse 88 Comment: apical  Temp 99.2 F (37.3 C) (Oral)   Ht 5' 4.5" (1.638 m)   Wt 128 lb 12.8 oz (58.4 kg)   SpO2 100%   BMI 21.77 kg/m    Subjective:    Patient ID: Jennifer Watts, female    DOB: 04-17-1996, 23 y.o.   MRN: [id number]  HPI: Jennifer Watts is a 23 y.o. female presenting on 01/03/2019 for comprehensive medical examination. Current medical complaints include:none  She currently lives with: fiance  MULTIPLE SCLEROSIS: Followed by neurology and is seeing MS specialist in upcoming months.  Currently not taking treatment due to side effects.  Report she feels the best she has in awhile at this time.  Denies any changes in function.    MIGRAINES Followed by neurology.  Continues on Pamelor and Imitrex.  Reports good control with minimal migraines. Duration: years Onset: gradual Headache status at time of visit: asymptomatic Aura: yes Nausea:  yes Vomiting: no Photophobia:  yes Phonophobia:  no Effect on social functioning:  no Numbers of missed days of school/work each month: none at this time Confusion:  no Gait disturbance/ataxia:  no Behavioral changes:  no Fevers:  no   DEPRESSION Continues on Celexa with good control. Mood status: stable Satisfied with current treatment?: yes Symptom severity: mild  Duration of current treatment : chronic Side effects: no Medication compliance: good compliance Psychotherapy/counseling: none Depressed mood: no Anxious mood: no Anhedonia: no Significant weight loss or gain: no Insomnia: yes hard to fall asleep Fatigue: no Feelings of worthlessness or guilt: no Impaired concentration/indecisiveness: yes Suicidal ideations: no Hopelessness: no Crying spells: no  Depression Screen done today and results listed below:  Depression screen [facility] 2/9 01/03/2019 07/23/2018 06/02/2015  Decreased Interest 0 0 3  Down, Depressed, Hopeless 0 0 2  PHQ - 2 Score 0 0 5   Altered sleeping 1 - 2  Tired, decreased energy 0 - 2  Change in appetite 0 - 0  Feeling bad or failure about yourself  0 - 2  Trouble concentrating 1 - 0  Moving slowly or fidgety/restless 0 - 1  Suicidal thoughts 0 - 0  PHQ-9 Score 2 - 12  Difficult doing work/chores Not difficult at all - -    Past Medical History:  Past Medical History:  Diagnosis Date  . Anxiety   . Depression   . MS (multiple sclerosis) (HCC)   . Neuromuscular disorder [facility])     Surgical History:  Past Surgical History:  Procedure Laterality Date  . TYMPANOSTOMY TUBE PLACEMENT      Medications:  Current Outpatient Medications on File Prior to Visit  Medication Sig  . ALPRAZolam (XANAX) 0.25 MG tablet Take 0.25 mg by mouth daily as needed. for anxiety  . butalbital-acetaminophen-caffeine (FIORICET, ESGIC) 50-325-40 MG tablet TAKE 1 TAB AT HEADACHE ONSET CAN REPEAT ONCE IN 2 HOURS IF NEEDED. NO MORE THAN 2 DOSES IN 24 HOURS  . cholecalciferol (VITAMIN D3) 25 MCG (1000 UT) tablet Take 1,000 Units by mouth daily.  . citalopram (CELEXA) 40 MG tablet TAKE 1 TABLET BY MOUTH EVERY DAY  . Cyanocobalamin (B-12 PO) Take 1 tablet by mouth daily.  . fluconazole (DIFLUCAN) 150 MG tablet TAKE ONE TABLET EVERY 72 HOURS FOR 14 DAYS, THEN TAKE ONE TABLET ONCE WEEKLY FOR 6 MONTHS.  . Galcanezumab-gnlm (EMGALITY) 120 MG/ML SOAJ INJECT 120 MG SUBCUTANEOUSLY EVERY 28 (TWENTY-EIGHT) DAYS  . medroxyPROGESTERone (DEPO-PROVERA)  150 MG/ML injection INJECT 1 ML (150 MG TOTAL) INTO THE MUSCLE EVERY 3 (THREE) MONTHS.  Marland Kitchen. metroNIDAZOLE (METROGEL) 0.75 % vaginal gel PLACE 1 APPLICATORFUL VAGINALLY AT BEDTIME.  . nitrofurantoin, macrocrystal-monohydrate, (MACROBID) 100 MG capsule Take 1 capsule (100 mg total) by mouth as needed (daily after sexual intercourse only to prevent UTI).  . nortriptyline (PAMELOR) 10 MG capsule TAKE 1 CAPSULE BY MOUTH NIGHTLY FOR ONE WEEK, THEN INCREASE TO 2 CAPSULES NIGHTLY  . SUMAtriptan (IMITREX) 100 MG  tablet Take 50-100 mg by mouth daily as needed.  . promethazine (PHENERGAN) 12.5 MG tablet Take 1 tablet (12.5 mg total) by mouth every 6 (six) hours as needed for nausea or vomiting. (Patient not taking: Reported on 05/10/2018)   Current Facility-Administered Medications on File Prior to Visit  Medication  . acetaminophen (TYLENOL) tablet 1,000 mg  . diphenhydrAMINE (BENADRYL) capsule 50 mg  . methylPREDNISolone sodium succinate (SOLU-MEDROL) 125 mg/2 mL injection 100 mg  . riTUXimab (RITUXAN) 1,000 mg in sodium chloride 0.9 % 150 mL chemo infusion    Allergies:  Allergies  Allergen Reactions  . Reglan [Metoclopramide] Other (See Comments)    Hyperactivity /becomes mean  . Ibuprofen Rash  . Penicillins Rash    Has patient had a PCN reaction causing immediate rash, facial/tongue/throat swelling, SOB or lightheadedness with hypotension: No Has patient had a PCN reaction causing severe rash involving mucus membranes or skin necrosis: No Has patient had a PCN reaction that required hospitalization: No Has patient had a PCN reaction occurring within the last 10 years: No If all of the above answers are "NO", then may proceed with Cephalosporin use.   . Sulfa Antibiotics Rash  . Sulfasalazine Rash    Social History:  Social History   Socioeconomic History  . Marital status: Single    Spouse name: Not on file  . Number of children: Not on file  . Years of education: Not on file  . Highest education level: Not on file  Occupational History  . Not on file  Social Needs  . Financial resource strain: Not on file  . Food insecurity    Worry: Not on file    Inability: Not on file  . Transportation needs    Medical: Not on file    Non-medical: Not on file  Tobacco Use  . Smoking status: Current Every Day Smoker    Packs/day: 0.25    Types: Cigarettes  . Smokeless tobacco: Never Used  Substance and Sexual Activity  . Alcohol use: No  . Drug use: No  . Sexual activity: Yes     Birth control/protection: Injection  Lifestyle  . Physical activity    Days per week: Not on file    Minutes per session: Not on file  . Stress: Not on file  Relationships  . Social Musicianconnections    Talks on phone: Not on file    Gets together: Not on file    Attends religious service: Not on file    Active member of club or organization: Not on file    Attends meetings of clubs or organizations: Not on file    Relationship status: Not on file  . Intimate partner violence    Fear of current or ex partner: Not on file    Emotionally abused: Not on file    Physically abused: Not on file    Forced sexual activity: Not on file  Other Topics Concern  . Not on file  Social History Narrative  .  Not on file   Social History   Tobacco Use  Smoking Status Current Every Day Smoker  . Packs/day: 0.25  . Types: Cigarettes  Smokeless Tobacco Never Used   Social History   Substance and Sexual Activity  Alcohol Use No    Family History:  Family History  Problem Relation Age of Onset  . Hypertension Maternal Grandmother   . Cancer Paternal Grandfather        throat  . Multiple sclerosis Paternal Grandfather   . Anxiety disorder Mother     Past medical history, surgical history, medications, allergies, family history and social history reviewed with patient today and changes made to appropriate areas of the chart.   Review of Systems - negative All other ROS negative except what is listed above and in the HPI.      Objective:    BP 127/88 (BP Location: Left Arm, Cuff Size: Normal)   Pulse 88 Comment: apical  Temp 99.2 F (37.3 C) (Oral)   Ht 5' 4.5" (1.638 m)   Wt 128 lb 12.8 oz (58.4 kg)   SpO2 100%   BMI 21.77 kg/m   Wt Readings from Last 3 Encounters:  01/03/19 128 lb 12.8 oz (58.4 kg)  08/23/18 114 lb (51.7 kg)  07/23/18 114 lb 2 oz (51.8 kg)    Physical Exam Vitals signs and nursing note reviewed.  Constitutional:      General: She is awake. She is not in  acute distress.    Appearance: She is well-developed. She is not ill-appearing.  HENT:     Head: Normocephalic.     Right Ear: Hearing, tympanic membrane, ear canal and external ear normal.     Left Ear: Hearing, tympanic membrane, ear canal and external ear normal.     Nose: Nose normal.     Mouth/Throat:     Mouth: Mucous membranes are moist.     Pharynx: Oropharynx is clear. Uvula midline.  Eyes:     General: Lids are normal.        Right eye: No discharge.        Left eye: No discharge.     Conjunctiva/sclera: Conjunctivae normal.     Pupils: Pupils are equal, round, and reactive to light.  Neck:     Musculoskeletal: Normal range of motion and neck supple.     Thyroid: No thyromegaly.     Vascular: No carotid bruit.  Cardiovascular:     Rate and Rhythm: Normal rate and regular rhythm.     Heart sounds: Normal heart sounds. No murmur. No gallop.   Pulmonary:     Effort: Pulmonary effort is normal. No accessory muscle usage or respiratory distress.     Breath sounds: Normal breath sounds.  Abdominal:     General: Bowel sounds are normal.     Palpations: Abdomen is soft.     Tenderness: There is no abdominal tenderness.  Musculoskeletal:     Right lower leg: No edema.     Left lower leg: No edema.  Lymphadenopathy:     Cervical: No cervical adenopathy.  Skin:    General: Skin is warm and dry.  Neurological:     Mental Status: She is alert and oriented to person, place, and time.     Deep Tendon Reflexes:     Reflex Scores:      Brachioradialis reflexes are 2+ on the right side and 2+ on the left side.      Patellar reflexes are 2+ on  the right side and 2+ on the left side. Psychiatric:        Attention and Perception: Attention normal.        Mood and Affect: Mood normal.        Speech: Speech normal.        Behavior: Behavior normal. Behavior is cooperative.        Thought Content: Thought content normal.        Cognition and Memory: Cognition normal.         Judgment: Judgment normal.     Results for orders placed or performed in visit on 08/23/18  WET PREP FOR TRICH, YEAST, CLUE   Specimen: Vaginal Fluid   VAGINAL FLUI  Result Value Ref Range   Trichomonas Exam Negative Negative   Yeast Exam Negative Negative   Clue Cell Exam Positive (A) Negative      Assessment & Plan:   Problem List Items Addressed This Visit      Cardiovascular and Mediastinum   Migraine without aura and without status migrainosus, not intractable    Chronic, stable.  Continue to collaborate with neurology team.      Relevant Medications   Galcanezumab-gnlm (EMGALITY) 120 MG/ML SOAJ     Nervous and Auditory   Multiple sclerosis (HCC)    Chronic, ongoing.  Continue to collaborate with neurology team and review notes when available.        Other   Depression    Chronic, stable.  Continue current medication regimen and adjust as needed based on mood.      Vitamin D deficiency    Continue supplement.  Recheck Vit D level.      Relevant Orders   VITAMIN D 25 Hydroxy (Vit-D Deficiency, Fractures)    Other Visit Diagnoses    Encounter for annual physical exam    -  Primary   Relevant Orders   CBC with Differential/Platelet   Comprehensive metabolic panel   TSH   VITAMIN D 25 Hydroxy (Vit-D Deficiency, Fractures)       Follow up plan: Return in about 6 months (around 07/06/2019) for MS, Vit D.   LABORATORY TESTING:  - Pap smear: pap done on 12/16/2018 with Dr. Dalbert GarnetBeasley  IMMUNIZATIONS:   - Tdap: Tetanus vaccination status reviewed: last tetanus booster within 10 years. - Influenza: Up to date - Pneumovax: Not applicable - Prevnar: Not applicable - HPV: Not applicable - Zostavax vaccine: Not applicable  SCREENING: -Mammogram: Not applicable  - Colonoscopy: Not applicable  - Bone Density: Not applicable  -Hearing Test: Not applicable  -Spirometry: Not applicable   PATIENT COUNSELING:   Advised to take 1 mg of folate supplement per day if  capable of pregnancy.   Sexuality: Discussed sexually transmitted diseases, partner selection, use of condoms, avoidance of unintended pregnancy  and contraceptive alternatives.   Advised to avoid cigarette smoking.  I discussed with the patient that most people either abstain from alcohol or drink within safe limits (<=14/week and <=4 drinks/occasion for males, <=7/weeks and <= 3 drinks/occasion for females) and that the risk for alcohol disorders and other health effects rises proportionally with the number of drinks per week and how often a drinker exceeds daily limits.  Discussed cessation/primary prevention of drug use and availability of treatment for abuse.   Diet: Encouraged to adjust caloric intake to maintain  or achieve ideal body weight, to reduce intake of dietary saturated fat and total fat, to limit sodium intake by avoiding high sodium foods and not  adding table salt, and to maintain adequate dietary potassium and calcium preferably from fresh fruits, vegetables, and low-fat dairy products.    stressed the importance of regular exercise  Injury prevention: Discussed safety belts, safety helmets, smoke detector, smoking near bedding or upholstery.   Dental health: Discussed importance of regular tooth brushing, flossing, and dental visits.    NEXT PREVENTATIVE PHYSICAL DUE IN 1 YEAR. Return in about 6 months (around 07/06/2019) for MS, Vit D.

## 2019-01-03 NOTE — Assessment & Plan Note (Signed)
Chronic, stable.  Continue current medication regimen and adjust as needed based on mood.

## 2019-01-03 NOTE — Assessment & Plan Note (Signed)
Continue supplement Recheck Vit D level 

## 2019-01-03 NOTE — Patient Instructions (Signed)
Healthy Eating Following a healthy eating pattern may help you to achieve and maintain a healthy body weight, reduce the risk of chronic disease, and live a long and productive life. It is important to follow a healthy eating pattern at an appropriate calorie level for your body. Your nutritional needs should be met primarily through food by choosing a variety of nutrient-rich foods. What are tips for following this plan? Reading food labels  Read labels and choose the following: ? Reduced or low sodium. ? Juices with 100% fruit juice. ? Foods with low saturated fats and high polyunsaturated and monounsaturated fats. ? Foods with whole grains, such as whole wheat, cracked wheat, brown rice, and wild rice. ? Whole grains that are fortified with folic acid. This is recommended for women who are pregnant or who want to become pregnant.  Read labels and avoid the following: ? Foods with a lot of added sugars. These include foods that contain brown sugar, corn sweetener, corn syrup, dextrose, fructose, glucose, high-fructose corn syrup, honey, invert sugar, lactose, malt syrup, maltose, molasses, raw sugar, sucrose, trehalose, or turbinado sugar.  Do not eat more than the following amounts of added sugar per day:  6 teaspoons (25 g) for women.  9 teaspoons (38 g) for men. ? Foods that contain processed or refined starches and grains. ? Refined grain products, such as white flour, degermed cornmeal, white bread, and white rice. Shopping  Choose nutrient-rich snacks, such as vegetables, whole fruits, and nuts. Avoid high-calorie and high-sugar snacks, such as potato chips, fruit snacks, and candy.  Use oil-based dressings and spreads on foods instead of solid fats such as butter, stick margarine, or cream cheese.  Limit pre-made sauces, mixes, and "instant" products such as flavored rice, instant noodles, and ready-made pasta.  Try more plant-protein sources, such as tofu, tempeh, black beans,  edamame, lentils, nuts, and seeds.  Explore eating plans such as the Mediterranean diet or vegetarian diet. Cooking  Use oil to saut or stir-fry foods instead of solid fats such as butter, stick margarine, or lard.  Try baking, boiling, grilling, or broiling instead of frying.  Remove the fatty part of meats before cooking.  Steam vegetables in water or broth. Meal planning   At meals, imagine dividing your plate into fourths: ? One-half of your plate is fruits and vegetables. ? One-fourth of your plate is whole grains. ? One-fourth of your plate is protein, especially lean meats, poultry, eggs, tofu, beans, or nuts.  Include low-fat dairy as part of your daily diet. Lifestyle  Choose healthy options in all settings, including home, work, school, restaurants, or stores.  Prepare your food safely: ? Wash your hands after handling raw meats. ? Keep food preparation surfaces clean by regularly washing with hot, soapy water. ? Keep raw meats separate from ready-to-eat foods, such as fruits and vegetables. ? Cook seafood, meat, poultry, and eggs to the recommended internal temperature. ? Store foods at safe temperatures. In general:  Keep cold foods at 59F (4.4C) or below.  Keep hot foods at 159F (60C) or above.  Keep your freezer at [facility] (-17.8C) or below.  Foods are no longer safe to eat when they have been between the temperatures of 40-159F (4.4-60C) for more than 2 hours. What foods should I eat? Fruits Aim to eat 2 cup-equivalents of fresh, canned (in natural juice), or frozen fruits each day. Examples of 1 cup-equivalent of fruit include 1 small apple, 8 large strawberries, 1 cup canned fruit,  cup  dried fruit, or 1 cup 100% juice. Vegetables Aim to eat 2-3 cup-equivalents of fresh and frozen vegetables each day, including different varieties and colors. Examples of 1 cup-equivalent of vegetables include 2 medium carrots, 2 cups raw, leafy greens, 1 cup chopped  vegetable (raw or cooked), or 1 medium baked potato. Grains Aim to eat 6 ounce-equivalents of whole grains each day. Examples of 1 ounce-equivalent of grains include 1 slice of bread, 1 cup ready-to-eat cereal, 3 cups popcorn, or  cup cooked rice, pasta, or cereal. Meats and other proteins Aim to eat 5-6 ounce-equivalents of protein each day. Examples of 1 ounce-equivalent of protein include 1 egg, 1/2 cup nuts or seeds, or 1 tablespoon (16 g) peanut butter. A cut of meat or fish that is the size of a deck of cards is about 3-4 ounce-equivalents.  Of the protein you eat each week, try to have at least 8 ounces come from seafood. This includes salmon, trout, herring, and anchovies. Dairy Aim to eat 3 cup-equivalents of fat-free or low-fat dairy each day. Examples of 1 cup-equivalent of dairy include 1 cup (240 mL) milk, 8 ounces (250 g) yogurt, 1 ounces (44 g) natural cheese, or 1 cup (240 mL) fortified soy milk. Fats and oils  Aim for about 5 teaspoons (21 g) per day. Choose monounsaturated fats, such as canola and olive oils, avocados, peanut butter, and most nuts, or polyunsaturated fats, such as sunflower, corn, and soybean oils, walnuts, pine nuts, sesame seeds, sunflower seeds, and flaxseed. Beverages  Aim for six 8-oz glasses of water per day. Limit coffee to three to five 8-oz cups per day.  Limit caffeinated beverages that have added calories, such as soda and energy drinks.  Limit alcohol intake to no more than 1 drink a day for nonpregnant women and 2 drinks a day for men. One drink equals 12 oz of beer (355 mL), 5 oz of wine (148 mL), or 1 oz of hard liquor (44 mL). Seasoning and other foods  Avoid adding excess amounts of salt to your foods. Try flavoring foods with herbs and spices instead of salt.  Avoid adding sugar to foods.  Try using oil-based dressings, sauces, and spreads instead of solid fats. This information is based on general [place] nutrition guidelines. For more  information, visit choosemyplate.gov. Exact amounts may vary based on your nutrition needs. Summary  A healthy eating plan may help you to maintain a healthy weight, reduce the risk of chronic diseases, and stay active throughout your life.  Plan your meals. Make sure you eat the right portions of a variety of nutrient-rich foods.  Try baking, boiling, grilling, or broiling instead of frying.  Choose healthy options in all settings, including home, work, school, restaurants, or stores. This information is not intended to replace advice given to you by your health care provider. Make sure you discuss any questions you have with your health care provider. Document Released: 08/13/2017 Document Revised: 08/13/2017 Document Reviewed: 08/13/2017 Elsevier Patient Education  2020 Elsevier Inc.  

## 2019-01-03 NOTE — Assessment & Plan Note (Signed)
Chronic, ongoing.  Continue to collaborate with neurology team and review notes when available.

## 2019-01-03 NOTE — Assessment & Plan Note (Signed)
Chronic, stable.  Continue to collaborate with neurology team.

## 2019-01-04 LAB — CBC WITH DIFFERENTIAL/PLATELET
Basophils Absolute: 0.1 10*3/uL (ref 0.0–0.2)
Basos: 1 %
EOS (ABSOLUTE): 0.4 10*3/uL (ref 0.0–0.4)
Eos: 4 %
Hematocrit: 47.6 % — ABNORMAL HIGH (ref 34.0–46.6)
Hemoglobin: 16 g/dL — ABNORMAL HIGH (ref 11.1–15.9)
Immature Grans (Abs): 0.1 10*3/uL (ref 0.0–0.1)
Immature Granulocytes: 1 %
Lymphocytes Absolute: 3.4 10*3/uL — ABNORMAL HIGH (ref 0.7–3.1)
Lymphs: 37 %
MCH: 31.6 pg (ref 26.6–33.0)
MCHC: 33.6 g/dL (ref 31.5–35.7)
MCV: 94 fL (ref 79–97)
Monocytes Absolute: 0.6 10*3/uL (ref 0.1–0.9)
Monocytes: 6 %
Neutrophils Absolute: 4.8 10*3/uL (ref 1.4–7.0)
Neutrophils: 51 %
Platelets: 340 10*3/uL (ref 150–450)
RBC: 5.07 x10E6/uL (ref 3.77–5.28)
RDW: 11.9 % (ref 11.7–15.4)
WBC: 9.3 10*3/uL (ref 3.4–10.8)

## 2019-01-04 LAB — COMPREHENSIVE METABOLIC PANEL WITH GFR
ALT: 15 IU/L (ref 0–32)
AST: 15 IU/L (ref 0–40)
Albumin/Globulin Ratio: 2.6 — ABNORMAL HIGH (ref 1.2–2.2)
Albumin: 4.7 g/dL (ref 3.9–5.0)
Alkaline Phosphatase: 55 IU/L (ref 39–117)
BUN/Creatinine Ratio: 19 (ref 9–23)
BUN: 14 mg/dL (ref 6–20)
Bilirubin Total: 0.4 mg/dL (ref 0.0–1.2)
CO2: 24 mmol/L (ref 20–29)
Calcium: 9.8 mg/dL (ref 8.7–10.2)
Chloride: 100 mmol/L (ref 96–106)
Creatinine, Ser: 0.73 mg/dL (ref 0.57–1.00)
GFR calc Af Amer: 136 mL/min/1.73
GFR calc non Af Amer: 118 mL/min/1.73
Globulin, Total: 1.8 g/dL (ref 1.5–4.5)
Glucose: 97 mg/dL (ref 65–99)
Potassium: 4.8 mmol/L (ref 3.5–5.2)
Sodium: 138 mmol/L (ref 134–144)
Total Protein: 6.5 g/dL (ref 6.0–8.5)

## 2019-01-04 LAB — TSH: TSH: 1.28 u[IU]/mL (ref 0.450–4.500)

## 2019-01-04 LAB — VITAMIN D 25 HYDROXY (VIT D DEFICIENCY, FRACTURES): Vit D, 25-Hydroxy: 30.7 ng/mL (ref 30.0–100.0)

## 2019-01-23 ENCOUNTER — Other Ambulatory Visit: Payer: Self-pay | Admitting: Unknown Physician Specialty

## 2019-01-23 NOTE — Telephone Encounter (Signed)
Does she come to office to have these done?

## 2019-01-23 NOTE — Telephone Encounter (Signed)
Requested medication (s) are due for refill today: yes  Requested medication (s) are on the active medication list: yes  Last refill:  11/12/2018  Future visit scheduled:yes  Notes to clinic:  Review for refill   Requested Prescriptions  Pending Prescriptions Disp Refills   medroxyPROGESTERone (DEPO-PROVERA) 150 MG/ML injection [Pharmacy Med Name: MEDROXYPROGESTERONE 150 MG/ML] 1 mL 4    Sig: INJECT 1 ML (150 MG TOTAL) INTO THE MUSCLE EVERY 3 (THREE) MONTHS.     Off-Protocol Failed - 01/23/2019  2:56 PM      Failed - Medication not assigned to a protocol, review manually.      Passed - Valid encounter within last 12 months    Recent Outpatient Visits          2 weeks ago Encounter for annual physical exam   [facility], Henrine Screws T, NP   5 months ago Bacterial vaginosis   [facility], Sipsey T, NP   5 months ago Bacterial vaginosis   [place], Barbaraann Faster, NP   6 months ago [place], Barbaraann Faster, NP   8 months ago Yeast infection of the vagina   [employer], Barbaraann Faster, NP      Future Appointments            In 5 months [place], Barbaraann Faster, NP [employer]

## 2019-01-24 ENCOUNTER — Telehealth: Payer: Self-pay | Admitting: Nurse Practitioner

## 2019-01-24 NOTE — Telephone Encounter (Signed)
Routing to provider  

## 2019-01-24 NOTE — Telephone Encounter (Signed)
Patient is requesting a referral to Dr. Harrell Gave [facility] at [place]. The patient has MS. The bp of the patient has been elevated. The medication is so strong. Wanting to get a check up.   Please advise [phone]

## 2019-01-24 NOTE — Telephone Encounter (Signed)
Scheduled for Monday at 3:30 per Jolene's request via Skype.

## 2019-01-24 NOTE — Telephone Encounter (Signed)
See message below, patient's mother gives her the injection.

## 2019-01-24 NOTE — Telephone Encounter (Signed)
Called and LVM for patient to please return my call. 

## 2019-01-24 NOTE — Telephone Encounter (Signed)
Spoke to [employer] via telephone.  Her mom sees Dr. Saunders Revel for cardiac assessments with her MS.  Jennifer Watts reports some chest pressure on/off over past couple weeks, no diaphoresis, N&V, or pain with episodes.  She states pressure and feels it is not anxiety.  Is agreeable to seeing PCP first for further work-up and then will place referral per her request.  Will get her scheduled for Monday.  Recommended if worsening symptoms prior to this she is to go to ER.

## 2019-01-24 NOTE — Telephone Encounter (Signed)
Patient's mother is returning [place] Call. Patient's mother gives the patient the depo shot.   Please advise. [phone]

## 2019-01-27 ENCOUNTER — Encounter: Payer: Self-pay | Admitting: Nurse Practitioner

## 2019-01-27 ENCOUNTER — Ambulatory Visit: Payer: Medicaid Other | Admitting: Nurse Practitioner

## 2019-01-27 ENCOUNTER — Other Ambulatory Visit: Payer: Self-pay

## 2019-01-27 VITALS — BP 120/78 | HR 98 | Temp 98.9°F

## 2019-01-27 DIAGNOSIS — R12 Heartburn: Secondary | ICD-10-CM | POA: Insufficient documentation

## 2019-01-27 DIAGNOSIS — R0789 Other chest pain: Secondary | ICD-10-CM

## 2019-01-27 MED ORDER — FAMOTIDINE 20 MG PO TABS: 20.0000 mg | ORAL_TABLET | Freq: Every day | ORAL | 3 refills | Status: DC

## 2019-01-27 NOTE — Assessment & Plan Note (Signed)
EKG, exam, and recent 01/03/19 labs reassuring.  Suspect related more to reflux.  Have recommend complete smoking cessation.  Will place referral to Dr. Saunders Revel per patient and mother request, as they wish further cardiac work-up.  Return in 4 weeks or sooner for worsening symptoms.

## 2019-01-27 NOTE — Assessment & Plan Note (Signed)
In patient with MS, will trial daily Pepcid, script sent.  Recommend keeping food diary and removing those foods from diet that cause increased reflux + complete smoking cessation.  Return to office in 4 weeks for follow-up.

## 2019-01-27 NOTE — Progress Notes (Signed)
BP 120/78 (BP Location: Left Arm, Patient Position: Sitting)   Pulse 98   Temp 98.9 F (37.2 C) (Oral)    Subjective:    Patient ID: Jennifer Watts, female    DOB: 01/11/1996, 23 y.o.   MRN: [id number]  HPI: Jennifer Watts is a 23 y.o. female  Chief Complaint  Patient presents with  . Chest Pain   CHEST PRESSURE: States that she has had chest pressure sensation for a couple weeks. Notices it every day, lasts all day if she does not do anything to help it.  Getting into a hot bath helps, pressure goes away with this.  Has not tried anything else.  Can not think of anything that makes it worse. Reports it does not feel like anxiety, this is different in that it feels like someone sitting on her chest vs her anxiety feels like rapid heart beat.  Denies an N&V, SOB, diaphoresis, headache.  Reports she has been having more heart burn recently, although she reports the two present differently.  Her heart burn presents with burning feeling, milk resolves this.  Has not taken any other medications for GERD.  Is diagnosed with MS, Emgality has had 3 doses total and takes Xanax three times a day.  She reports the Xanax helps with anxiety.  When she has the chest pressure she denies palpitations, reports just feels strained.  Denies any MJ use, has used in past.  Her mother had similar issues and is at bedside today, her mother also has MS.  Her mother and her would like referral to Dr. Okey DupreEnd with cardiology, who pt's mother sees, for further work-up.    Relevant past medical, surgical, family and social history reviewed and updated as indicated. Interim medical history since our last visit reviewed. Allergies and medications reviewed and updated.  Review of Systems  Constitutional: Negative for activity change, appetite change, diaphoresis, fatigue and fever.  Respiratory: Negative for cough, chest tightness, shortness of breath and wheezing.   Cardiovascular: Negative for chest pain,  palpitations and leg swelling.  Gastrointestinal: Negative for abdominal distention, abdominal pain, constipation, diarrhea, nausea and vomiting.  Endocrine: Negative for cold intolerance and heat intolerance.  Neurological: Negative for dizziness, syncope, weakness, light-headedness, numbness and headaches.  Psychiatric/Behavioral: Negative.     Per HPI unless specifically indicated above     Objective:    BP 120/78 (BP Location: Left Arm, Patient Position: Sitting)   Pulse 98   Temp 98.9 F (37.2 C) (Oral)   Wt Readings from Last 3 Encounters:  01/03/19 128 lb 12.8 oz (58.4 kg)  08/23/18 114 lb (51.7 kg)  07/23/18 114 lb 2 oz (51.8 kg)    Physical Exam Vitals signs and nursing note reviewed.  Constitutional:      General: She is awake. She is not in acute distress.    Appearance: She is well-developed. She is not ill-appearing.  HENT:     Head: Normocephalic.     Right Ear: Hearing normal.     Left Ear: Hearing normal.  Eyes:     General: Lids are normal.        Right eye: No discharge.        Left eye: No discharge.     Conjunctiva/sclera: Conjunctivae normal.     Pupils: Pupils are equal, round, and reactive to light.  Neck:     Musculoskeletal: Normal range of motion and neck supple.     Thyroid: No thyromegaly.     Vascular:  No carotid bruit.  Cardiovascular:     Rate and Rhythm: Normal rate and regular rhythm.     Heart sounds: Normal heart sounds. No murmur. No gallop.   Pulmonary:     Effort: No accessory muscle usage or respiratory distress.     Breath sounds: Normal breath sounds.  Abdominal:     General: Bowel sounds are normal.     Palpations: Abdomen is soft. There is no hepatomegaly or splenomegaly.  Musculoskeletal:     Right lower leg: No edema.     Left lower leg: No edema.  Skin:    General: Skin is warm and dry.  Neurological:     Mental Status: She is alert and oriented to person, place, and time.  Psychiatric:        Attention and  Perception: Attention normal.        Mood and Affect: Mood normal.        Behavior: Behavior normal. Behavior is cooperative.        Thought Content: Thought content normal.        Judgment: Judgment normal.   EKG performed noting NSR with normal axis, HR 99.  Results for orders placed or performed in visit on 01/03/19  CBC with Differential/Platelet  Result Value Ref Range   WBC 9.3 3.4 - 10.8 x10E3/uL   RBC 5.07 3.77 - 5.28 x10E6/uL   Hemoglobin 16.0 (H) 11.1 - 15.9 g/dL   Hematocrit 47.6 (H) 34.0 - 46.6 %   MCV 94 79 - 97 fL   MCH 31.6 26.6 - 33.0 pg   MCHC 33.6 31.5 - 35.7 g/dL   RDW 11.9 11.7 - 15.4 %   Platelets 340 150 - 450 x10E3/uL   Neutrophils 51 Not Estab. %   Lymphs 37 Not Estab. %   Monocytes 6 Not Estab. %   Eos 4 Not Estab. %   Basos 1 Not Estab. %   Neutrophils Absolute 4.8 1.4 - 7.0 x10E3/uL   Lymphocytes Absolute 3.4 (H) 0.7 - 3.1 x10E3/uL   Monocytes Absolute 0.6 0.1 - 0.9 x10E3/uL   EOS (ABSOLUTE) 0.4 0.0 - 0.4 x10E3/uL   Basophils Absolute 0.1 0.0 - 0.2 x10E3/uL   Immature Granulocytes 1 Not Estab. %   Immature Grans (Abs) 0.1 0.0 - 0.1 x10E3/uL  Comprehensive metabolic panel  Result Value Ref Range   Glucose 97 65 - 99 mg/dL   BUN 14 6 - 20 mg/dL   Creatinine, Ser 0.73 0.57 - 1.00 mg/dL   GFR calc non Af Amer 118 >59 mL/min/1.73   GFR calc Af Amer 136 >59 mL/min/1.73   BUN/Creatinine Ratio 19 9 - 23   Sodium 138 134 - 144 mmol/L   Potassium 4.8 3.5 - 5.2 mmol/L   Chloride 100 96 - 106 mmol/L   CO2 24 20 - 29 mmol/L   Calcium 9.8 8.7 - 10.2 mg/dL   Total Protein 6.5 6.0 - 8.5 g/dL   Albumin 4.7 3.9 - 5.0 g/dL   Globulin, Total 1.8 1.5 - 4.5 g/dL   Albumin/Globulin Ratio 2.6 (H) 1.2 - 2.2   Bilirubin Total 0.4 0.0 - 1.2 mg/dL   Alkaline Phosphatase 55 39 - 117 IU/L   AST 15 0 - 40 IU/L   ALT 15 0 - 32 IU/L  TSH  Result Value Ref Range   TSH 1.280 0.450 - 4.500 uIU/mL  VITAMIN D 25 Hydroxy (Vit-D Deficiency, Fractures)  Result Value Ref  Range   Vit D, 25-Hydroxy 30.7 30.0 -  100.0 ng/mL      Assessment & Plan:   Problem List Items Addressed This Visit      Other   Chest pressure - Primary    EKG, exam, and recent 01/03/19 labs reassuring.  Suspect related more to reflux.  Have recommend complete smoking cessation.  Will place referral to Dr. Okey DupreEnd per patient and mother request, as they wish further cardiac work-up.  Return in 4 weeks or sooner for worsening symptoms.      Relevant Orders   EKG 12-Lead (Completed)   Ambulatory referral to Cardiology   Heart burn    In patient with MS, will trial daily Pepcid, script sent.  Recommend keeping food diary and removing those foods from diet that cause increased reflux + complete smoking cessation.  Return to office in 4 weeks for follow-up.          Follow up plan: Return in about 4 weeks (around 02/24/2019) for GERD.

## 2019-01-27 NOTE — Patient Instructions (Signed)
Famotidine tablets or gelcaps What is this medicine? FAMOTIDINE (fa MOE ti deen) is a type of antihistamine that blocks the release of stomach acid. It is used to treat stomach or intestinal ulcers. It can also relieve heartburn from acid reflux. This medicine may be used for other purposes; ask your health care provider or pharmacist if you have questions. COMMON BRAND NAME(S): Heartburn Relief, Pepcid, Pepcid AC, Pepcid AC Maximum Strength What should I tell my health care provider before I take this medicine? They need to know if you have any of these conditions:  kidney or liver disease  trouble swallowing  an unusual or allergic reaction to famotidine, other medicines, foods, dyes, or preservatives  pregnant or trying to get pregnant  breast-feeding How should I use this medicine? Take this medicine by mouth with a glass of water. Follow the directions on the prescription label. If you only take this medicine once a day, take it at bedtime. Take your doses at regular intervals. Do not take your medicine more often than directed. Talk to your pediatrician regarding the use of this medicine in children. Special care may be needed. Overdosage: If you think you have taken too much of this medicine contact a poison control center or emergency room at once. NOTE: This medicine is only for you. Do not share this medicine with others. What if I miss a dose? If you miss a dose, take it as soon as you can. If it is almost time for your next dose, take only that dose. Do not take double or extra doses. What may interact with this medicine?  delavirdine  itraconazole  ketoconazole This list may not describe all possible interactions. Give your health care provider a list of all the medicines, herbs, non-prescription drugs, or dietary supplements you use. Also tell them if you smoke, drink alcohol, or use illegal drugs. Some items may interact with your medicine. What should I watch for while  using this medicine? Tell your doctor or health care professional if your condition does not start to get better or if it gets worse. Finish the full course of tablets prescribed, even if you feel better. Do not take with aspirin, ibuprofen or other antiinflammatory medicines. These can make your condition worse. Do not smoke cigarettes or drink alcohol. These cause irritation in your stomach and can increase the time it will take for ulcers to heal. If you get black, tarry stools or vomit up what looks like coffee grounds, call your doctor or health care professional at once. You may have a bleeding ulcer. This medicine may cause a decrease in vitamin B12. You should make sure that you get enough vitamin B12 while you are taking this medicine. Discuss the foods you eat and the vitamins you take with your health care professional. What side effects may I notice from receiving this medicine? Side effects that you should report to your doctor or health care professional as soon as possible:  agitation, nervousness  confusion  hallucinations  skin rash, itching Side effects that usually do not require medical attention (report to your doctor or health care professional if they continue or are bothersome):  constipation  diarrhea  dizziness  headache This list may not describe all possible side effects. Call your doctor for medical advice about side effects. You may report side effects to FDA at 1-800-FDA-1088. Where should I keep my medicine? Keep out of the reach of children. Store at room temperature between 15 and 30 degrees C (  59 and 86 degrees F). Do not freeze. Throw away any unused medicine after the expiration date. NOTE: This sheet is a summary. It may not cover all possible information. If you have questions about this medicine, talk to your doctor, pharmacist, or health care provider.  2020 Elsevier/Gold Standard (2016-12-15 13:17:50)  

## 2019-02-14 DIAGNOSIS — Z23 Encounter for immunization: Secondary | ICD-10-CM | POA: Diagnosis not present

## 2019-02-14 DIAGNOSIS — E559 Vitamin D deficiency, unspecified: Secondary | ICD-10-CM | POA: Diagnosis not present

## 2019-02-14 DIAGNOSIS — G35 Multiple sclerosis: Secondary | ICD-10-CM | POA: Diagnosis not present

## 2019-02-20 ENCOUNTER — Telehealth: Payer: Self-pay | Admitting: Nurse Practitioner

## 2019-02-20 NOTE — Telephone Encounter (Signed)
Pt is calling to let jolene know the medication famotidine is working and she has cancelled her 02-25-2019 and she is not going to see heart doctor either

## 2019-02-20 NOTE — Telephone Encounter (Signed)
Perfect. Thank you!

## 2019-02-24 ENCOUNTER — Other Ambulatory Visit: Payer: Self-pay | Admitting: Psychiatry

## 2019-02-24 ENCOUNTER — Ambulatory Visit: Payer: Medicaid Other | Admitting: Internal Medicine

## 2019-02-24 DIAGNOSIS — G35 Multiple sclerosis: Secondary | ICD-10-CM

## 2019-02-25 ENCOUNTER — Ambulatory Visit: Payer: Medicaid Other | Admitting: Nurse Practitioner

## 2019-03-08 ENCOUNTER — Ambulatory Visit
Admission: RE | Admit: 2019-03-08 | Discharge: 2019-03-08 | Disposition: A | Discharge status: Home / Self Care | Payer: Medicaid Other | Source: Ambulatory Visit | Attending: Psychiatry | Admitting: Psychiatry

## 2019-03-08 ENCOUNTER — Other Ambulatory Visit: Payer: Self-pay

## 2019-03-08 DIAGNOSIS — M4004 Postural kyphosis, thoracic region: Secondary | ICD-10-CM | POA: Diagnosis not present

## 2019-03-08 DIAGNOSIS — G35 Multiple sclerosis: Secondary | ICD-10-CM | POA: Insufficient documentation

## 2019-03-08 DIAGNOSIS — Q7649 Other congenital malformations of spine, not associated with scoliosis: Secondary | ICD-10-CM | POA: Diagnosis not present

## 2019-03-08 DIAGNOSIS — R42 Dizziness and giddiness: Secondary | ICD-10-CM | POA: Diagnosis not present

## 2019-03-08 MED ORDER — GADOBUTROL 1 MMOL/ML IV SOLN
5.0000 mL | Freq: Once | INTRAVENOUS | Status: AC | PRN
  Administered 2019-03-08: 5 mL via INTRAVENOUS

## 2019-04-04 ENCOUNTER — Telehealth: Payer: Self-pay | Admitting: Nurse Practitioner

## 2019-04-04 ENCOUNTER — Other Ambulatory Visit: Payer: Self-pay | Admitting: Nurse Practitioner

## 2019-04-04 MED ORDER — FAMOTIDINE 20 MG PO TABS: 20.0000 mg | ORAL_TABLET | Freq: Every day | ORAL | 3 refills | Status: DC

## 2019-04-04 NOTE — Telephone Encounter (Signed)
Sent in ref[facility] to [facility]

## 2019-04-04 NOTE — Telephone Encounter (Signed)
famotidine (PEPCID) 20 MG tablet    Patient requesting early fill. Advised by pharmacy to contact PCP. Patient states she has been taking this medication x3 a day and is out. Please advise.    Pharmacy:  [id number] [place], [address] [phone] (Phone) [phone] (Fax)

## 2019-04-08 DIAGNOSIS — R5383 Other fatigue: Secondary | ICD-10-CM | POA: Diagnosis not present

## 2019-04-08 DIAGNOSIS — R9082 White matter disease, unspecified: Secondary | ICD-10-CM | POA: Diagnosis not present

## 2019-05-20 ENCOUNTER — Other Ambulatory Visit: Payer: Self-pay | Admitting: Nurse Practitioner

## 2019-05-20 ENCOUNTER — Telehealth: Payer: Self-pay | Admitting: Nurse Practitioner

## 2019-05-20 MED ORDER — FAMOTIDINE 20 MG PO TABS: 20.0000 mg | ORAL_TABLET | Freq: Every day | ORAL | 3 refills | Status: DC

## 2019-05-20 NOTE — Telephone Encounter (Signed)
Refill sent.

## 2019-05-20 NOTE — Telephone Encounter (Signed)
Pt mom is calling and her daughter takes 3 famotidine a day and needs new rx . [facility] [place]

## 2019-05-27 ENCOUNTER — Ambulatory Visit: Payer: Medicaid Other | Attending: Internal Medicine

## 2019-05-27 ENCOUNTER — Other Ambulatory Visit: Payer: Self-pay | Admitting: Nurse Practitioner

## 2019-05-27 DIAGNOSIS — Z20822 Contact with and (suspected) exposure to covid-19: Secondary | ICD-10-CM | POA: Diagnosis not present

## 2019-05-27 MED ORDER — FAMOTIDINE 20 MG PO TABS: 20.0000 mg | ORAL_TABLET | Freq: Two times a day (BID) | ORAL | 3 refills | Status: DC

## 2019-05-27 NOTE — Telephone Encounter (Signed)
Patient notified

## 2019-05-27 NOTE — Telephone Encounter (Signed)
Advised Pt of Rx sent but she stated it needs to say take 3x daily / Pt takes the medication 3xs a day and the pharmacy will not fill it due to the Sig stating 1x daily/ Pt has a couple pills left/ please call and advise

## 2019-05-27 NOTE — Telephone Encounter (Signed)
Please alert patient I have sent in script for two times a day.  Technically that is max dose.  Max dose is 40 MG total daily.  She should not take 3 times a day as that is over max.  I did send in twice a day dosing though.  Thank you.

## 2019-05-28 LAB — NOVEL CORONAVIRUS, NAA: SARS-CoV-2, NAA: NOT DETECTED

## 2019-05-30 DIAGNOSIS — R102 Pelvic and perineal pain: Secondary | ICD-10-CM | POA: Diagnosis not present

## 2019-06-25 DIAGNOSIS — R102 Pelvic and perineal pain: Secondary | ICD-10-CM | POA: Diagnosis not present

## 2019-06-25 DIAGNOSIS — R12 Heartburn: Secondary | ICD-10-CM | POA: Diagnosis not present

## 2019-06-25 DIAGNOSIS — R1013 Epigastric pain: Secondary | ICD-10-CM | POA: Diagnosis not present

## 2019-06-27 ENCOUNTER — Ambulatory Visit: Payer: Medicaid Other | Admitting: Nurse Practitioner

## 2019-07-08 ENCOUNTER — Ambulatory Visit: Payer: Self-pay | Admitting: Nurse Practitioner

## 2019-07-11 ENCOUNTER — Other Ambulatory Visit: Payer: Self-pay

## 2019-07-11 ENCOUNTER — Encounter: Payer: Self-pay | Admitting: Nurse Practitioner

## 2019-07-11 ENCOUNTER — Ambulatory Visit (INDEPENDENT_AMBULATORY_CARE_PROVIDER_SITE_OTHER): Payer: Medicaid Other | Admitting: Nurse Practitioner

## 2019-07-11 VITALS — BP 128/93 | HR 94 | Temp 98.4°F

## 2019-07-11 DIAGNOSIS — R12 Heartburn: Secondary | ICD-10-CM | POA: Diagnosis not present

## 2019-07-11 DIAGNOSIS — L309 Dermatitis, unspecified: Secondary | ICD-10-CM | POA: Diagnosis not present

## 2019-07-11 DIAGNOSIS — F330 Major depressive disorder, recurrent, mild: Secondary | ICD-10-CM | POA: Diagnosis not present

## 2019-07-11 MED ORDER — CLOBETASOL PROPIONATE 0.05 % EX CREA: 1.0000 "application " | TOPICAL_CREAM | Freq: Two times a day (BID) | CUTANEOUS | 2 refills | Status: DC

## 2019-07-11 MED ORDER — OMEPRAZOLE 20 MG PO CPDR: 20.0000 mg | DELAYED_RELEASE_CAPSULE | Freq: Every day | ORAL | 3 refills | Status: DC

## 2019-07-11 NOTE — Progress Notes (Signed)
BP (!) 128/93   Pulse 94 Comment: apical  Temp 98.4 F (36.9 C) (Oral)   SpO2 97%    Subjective:    Patient ID: Jennifer Watts, female    DOB: Aug 14, 1995, 24 y.o.   MRN: [id number]  HPI: Jennifer Watts is a 24 y.o. female  Chief Complaint  Patient presents with  . Depression   DEPRESSION Currently taking Celexa 40 MG daily + Trazodone 100 MG at night.  Takes Pamelor and Xanax as ordered by neurology. Mood status: stable Satisfied with current treatment?: yes Symptom severity: mild  Duration of current treatment : chronic Side effects: no Medication compliance: good compliance Psychotherapy/counseling: none Depressed mood: no Anxious mood: no Anhedonia: no Significant weight loss or gain: no Insomnia: at times Fatigue: no Feelings of worthlessness or guilt: no Impaired concentration/indecisiveness: no Suicidal ideations: no Hopelessness: no Crying spells: no Depression screen [facility] 2/9 07/11/2019 01/03/2019 07/23/2018 06/02/2015  Decreased Interest 0 0 0 3  Down, Depressed, Hopeless 0 0 0 2  PHQ - 2 Score 0 0 0 5  Altered sleeping 0 1 - 2  Tired, decreased energy 0 0 - 2  Change in appetite 2 0 - 0  Feeling bad or failure about yourself  0 0 - 2  Trouble concentrating 0 1 - 0  Moving slowly or fidgety/restless 0 0 - 1  Suicidal thoughts 0 0 - 0  PHQ-9 Score 2 2 - 12  Difficult doing work/chores Not difficult at all Not difficult at all - -   GAD 7 : Generalized Anxiety Score 07/11/2019  Nervous, Anxious, on Edge 1  Control/stop worrying 0  Worry too much - different things 0  Trouble relaxing 0  Restless 0  Easily annoyed or irritable 0  Afraid - awful might happen 0  Total GAD 7 Score 1   RASH Reports rash to ankles for 2 weeks.  Tried Calamine with no benefit and tried a friend's lotion (thinks this was a abx cream, but is not sure) and it made it worse.   Duration:  chronic  Location: ankles  Itching: yes Burning: no Redness: yes Oozing:  no Scaling: no Blisters: no Painful: no Fevers: no Change in detergents/soaps/personal care products: no Recent illness: no Recent travel:no History of same: yes Context: fluctuating Alleviating factors: nothing Treatments attempted: Calamine and a cream from a friend Shortness of breath: no  Throat/tongue swelling: no Myalgias/arthralgias: no   GERD She feels the Nortriptyline is making this worse, has tapered down and is only taking once per day.  Is taking Pepcid BID, but feels it does not work well.   GERD control status: uncontrolled  Satisfied with current treatment? no Heartburn frequency: once to twice a day Medication side effects: no  Medication compliance: stable Previous GERD medications: Pepcid and TUMS Antacid use frequency:  None at this time Duration: months Nature: burning Location: epigastric Heartburn duration: 30 minutes Alleviatiating factors:  Pepcid at times Aggravating factors: certain foods Dysphagia: no Odynophagia:  no Hematemesis: no Blood in stool: no EGD: no   Relevant past medical, surgical, family and social history reviewed and updated as indicated. Interim medical history since our last visit reviewed. Allergies and medications reviewed and updated.  Review of Systems  Constitutional: Negative for activity change, appetite change, diaphoresis, fatigue and fever.  Respiratory: Negative for cough, chest tightness and shortness of breath.   Cardiovascular: Negative for chest pain, palpitations and leg swelling.  Gastrointestinal: Negative.   Skin: Positive for rash.  Neurological: Negative.   Psychiatric/Behavioral: Positive for sleep disturbance. Negative for decreased concentration, self-injury and suicidal ideas. The patient is nervous/anxious.     Per HPI unless specifically indicated above     Objective:    BP (!) 128/93   Pulse 94 Comment: apical  Temp 98.4 F (36.9 C) (Oral)   SpO2 97%   Wt Readings from Last 3  Encounters:  01/03/19 128 lb 12.8 oz (58.4 kg)  08/23/18 114 lb (51.7 kg)  07/23/18 114 lb 2 oz (51.8 kg)    Physical Exam Vitals and nursing note reviewed.  Constitutional:      General: She is awake. She is not in acute distress.    Appearance: She is well-developed and well-groomed. She is not ill-appearing.  HENT:     Head: Normocephalic.     Right Ear: Hearing normal.     Left Ear: Hearing normal.  Eyes:     General: Lids are normal.        Right eye: No discharge.        Left eye: No discharge.     Conjunctiva/sclera: Conjunctivae normal.     Pupils: Pupils are equal, round, and reactive to light.  Neck:     Thyroid: No thyromegaly.     Vascular: No carotid bruit.  Cardiovascular:     Rate and Rhythm: Normal rate and regular rhythm.     Heart sounds: Normal heart sounds. No murmur. No gallop.   Pulmonary:     Effort: Pulmonary effort is normal. No accessory muscle usage or respiratory distress.     Breath sounds: Normal breath sounds.  Abdominal:     General: Bowel sounds are normal.     Palpations: Abdomen is soft.     Tenderness: There is no abdominal tenderness.  Musculoskeletal:     Cervical back: Normal range of motion and neck supple.     Right lower leg: No edema.     Left lower leg: No edema.  Skin:    General: Skin is warm and dry.     Findings: Rash present. Rash is scaling.     Comments: Small, approx 3 cm, round patches of scaling with mild erythema noted to anterior and posterior aspect of right ankle and posterior aspect of left ankle.  No blisters, drainage, or tenderness.  Neurological:     Mental Status: She is alert and oriented to person, place, and time.  Psychiatric:        Attention and Perception: Attention normal.        Mood and Affect: Mood normal.        Speech: Speech normal.        Behavior: Behavior normal. Behavior is cooperative.        Thought Content: Thought content normal.     Results for orders placed or performed in visit  on 05/27/19  Novel Coronavirus, NAA ([facility])   Specimen: Nasopharyngeal(NP) swabs in vial transport medium   NASOPHARYNGE  TESTING  Result Value Ref Range   SARS-CoV-2, NAA Not Detected Not Detected      Assessment & Plan:   Problem List Items Addressed This Visit      Musculoskeletal and Integument   Dermatitis    Acute to bilateral ankles.  Script for Clobetasol 0.05% cream sent.  Recommend monitoring area and avoiding irritants.  Return to office for worsening or ongoing issues.         Other   Depression - Primary    Chronic, stable.  Continue current medication regimen and adjust as needed based on mood.  Current medications prescribed by neurology, continue this collaboration.  She denies SI/HI.        Relevant Medications   ALPRAZolam (XANAX) 0.25 MG tablet   traZODone (DESYREL) 100 MG tablet   Heart burn    Chronic, ongoing with poor response to Pepcid.  Will trial Prilosec 20 MG daily and adjust dose as needed.  Goal is short term use.  Recommend monitoring diet and avoiding foods that cause increased symptoms.  Return in 6 months or sooner if worsening/ongoing.          Follow up plan: Return in about 6 months (around 01/08/2020) for Mood and GERD, HA, MS, Vit D.

## 2019-07-11 NOTE — Assessment & Plan Note (Signed)
Acute to bilateral ankles.  Script for Clobetasol 0.05% cream sent.  Recommend monitoring area and avoiding irritants.  Return to office for worsening or ongoing issues.

## 2019-07-11 NOTE — Patient Instructions (Signed)

## 2019-07-11 NOTE — Assessment & Plan Note (Signed)
Chronic, stable.  Continue current medication regimen and adjust as needed based on mood.  Current medications prescribed by neurology, continue this collaboration.  She denies SI/HI.

## 2019-07-11 NOTE — Assessment & Plan Note (Signed)
Chronic, ongoing with poor response to Pepcid.  Will trial Prilosec 20 MG daily and adjust dose as needed.  Goal is short term use.  Recommend monitoring diet and avoiding foods that cause increased symptoms.  Return in 6 months or sooner if worsening/ongoing.

## 2019-07-15 ENCOUNTER — Other Ambulatory Visit: Payer: Self-pay | Admitting: Nurse Practitioner

## 2019-07-15 ENCOUNTER — Encounter: Payer: Self-pay | Admitting: Nurse Practitioner

## 2019-07-15 MED ORDER — CLOBETASOL PROPIONATE 0.05 % EX CREA: 1.0000 "application " | TOPICAL_CREAM | Freq: Two times a day (BID) | CUTANEOUS | 2 refills | Status: DC

## 2019-08-05 DIAGNOSIS — F419 Anxiety disorder, unspecified: Secondary | ICD-10-CM | POA: Diagnosis not present

## 2019-08-05 DIAGNOSIS — E538 Deficiency of other specified B group vitamins: Secondary | ICD-10-CM | POA: Diagnosis not present

## 2019-08-05 DIAGNOSIS — F329 Major depressive disorder, single episode, unspecified: Secondary | ICD-10-CM | POA: Diagnosis not present

## 2019-08-05 DIAGNOSIS — F411 Generalized anxiety disorder: Secondary | ICD-10-CM | POA: Diagnosis not present

## 2019-08-07 ENCOUNTER — Other Ambulatory Visit: Payer: Self-pay | Admitting: Neurology

## 2019-08-07 DIAGNOSIS — G35 Multiple sclerosis: Secondary | ICD-10-CM

## 2019-08-07 NOTE — Progress Notes (Signed)
This RN spoke with patient regarding her upcoming Lumbar puncture appointment.  Patient states she is going to a "specialist for MS" and would like to cancel her procedure.  Appointment was cancelled and patient was advised to call Dr. Geralyn Flash office to advise of cancellation.

## 2019-08-13 ENCOUNTER — Ambulatory Visit: Payer: Medicaid Other

## 2019-08-13 ENCOUNTER — Ambulatory Visit
Admission: RE | Admit: 2019-08-13 | Discharge: 2019-08-13 | Disposition: A | Discharge status: Home / Self Care | Payer: Medicaid Other | Source: Ambulatory Visit | Attending: Neurology | Admitting: Neurology

## 2019-08-25 ENCOUNTER — Ambulatory Visit
Admission: EM | Admit: 2019-08-25 | Discharge: 2019-08-25 | Disposition: A | Discharge status: Home / Self Care | Payer: Medicaid Other | Attending: Urgent Care | Admitting: Urgent Care

## 2019-08-25 ENCOUNTER — Other Ambulatory Visit: Payer: Self-pay

## 2019-08-25 ENCOUNTER — Ambulatory Visit: Payer: Medicaid Other

## 2019-08-25 DIAGNOSIS — M25572 Pain in left ankle and joints of left foot: Secondary | ICD-10-CM | POA: Diagnosis not present

## 2019-08-25 DIAGNOSIS — M76822 Posterior tibial tendinitis, left leg: Secondary | ICD-10-CM

## 2019-08-25 DIAGNOSIS — M7989 Other specified soft tissue disorders: Secondary | ICD-10-CM | POA: Diagnosis not present

## 2019-08-25 MED ORDER — PREDNISONE 10 MG (21) PO TBPK: ORAL_TABLET | Freq: Every day | ORAL | 0 refills | Status: DC

## 2019-08-25 MED ORDER — TRAMADOL HCL 50 MG PO TABS: 50.0000 mg | ORAL_TABLET | Freq: Two times a day (BID) | ORAL | 0 refills | Status: DC | PRN

## 2019-08-25 MED ORDER — HYDROCODONE-ACETAMINOPHEN 5-325 MG PO TABS: 1.0000 | ORAL_TABLET | Freq: Two times a day (BID) | ORAL | 0 refills | Status: DC | PRN

## 2019-08-25 NOTE — ED Triage Notes (Signed)
Pt states she started walking for exercise about a week ago. By the 5th day she noticed left ankle soreness. Now with pain to inner aspect left ankle.

## 2019-08-25 NOTE — Discharge Instructions (Addendum)
It was very nice seeing you today in clinic. Thank you for entrusting me with your care.   Rest, ice, and elevate your ankle. Wear brace for support. Please utilize the medications that we discussed. Your prescriptions has been called in to your pharmacy.   Make arrangements to follow up with your regular doctor in 1 week for re-evaluation if not improving. If your symptoms/condition worsens, please seek follow up care either here or in the ER. Please remember, our [facility] providers are "right here with you" when you need [place].   Again, it was my pleasure to take care of you today. Thank you for choosing our clinic. I hope that you start to feel better quickly.   Quentin Mulling, MSN, APRN, FNP-C, CEN Advanced Practice Provider [facility]

## 2019-08-27 ENCOUNTER — Other Ambulatory Visit: Payer: Self-pay | Admitting: Nurse Practitioner

## 2019-08-27 ENCOUNTER — Encounter: Payer: Self-pay | Admitting: Nurse Practitioner

## 2019-08-27 MED ORDER — FLUCONAZOLE 150 MG PO TABS: 150.0000 mg | ORAL_TABLET | Freq: Once | ORAL | 0 refills | Status: AC

## 2019-08-27 NOTE — ED Provider Notes (Signed)
[place], [place]   Name: Jennifer Watts DOB: 10-Apr-1996 MRN: [id number] CSN: [id number] PCP: Marjie Skiff, NP  Arrival date and time:  08/25/19 1212  Chief Complaint:  Ankle Pain  NOTE: Prior to seeing the patient today, I have reviewed the triage nursing documentation and vital signs. Clinical staff has updated patient's PMH/PSHx, current medication list, and drug allergies/intolerances to ensure comprehensive history available to assist in medical decision making.   History:   HPI: Jennifer Watts is a 24 y.o. female who presents today with complaints of pain in her LEFT ankle that has been worsening over the course of the last 3-4 days. Patient denies injury. She has recently starting walking more in efforts to increase her physical activity. About the fifth day after starting her waling program, patient notes that her ankle started to become sore. Symptoms have been progressive. She is not experiencing swelling and increased pain overlying the medial malleolus. In efforts to conservatively manage her symptoms at home, the patient notes that she has used APAP, which has not helped to improve her symptoms. She is unable to use IBU due to allergy.   Past Medical History:  Diagnosis Date  . Anxiety   . Depression   . MS (multiple sclerosis) (HCC)   . Neuromuscular disorder [facility])     Past Surgical History:  Procedure Laterality Date  . TYMPANOSTOMY TUBE PLACEMENT      Family History  Problem Relation Age of Onset  . Hypertension Maternal Grandmother   . Cancer Paternal Grandfather        throat  . Multiple sclerosis Paternal Grandfather   . Anxiety disorder Mother     Social History   Tobacco Use  . Smoking status: Current Every Day Smoker    Packs/day: 0.25    Types: Cigarettes  . Smokeless tobacco: Never Used  Substance Use Topics  . Alcohol use: Yes    Comment: social  . Drug use: No    Patient Active Problem List   Diagnosis Date Noted  . Dermatitis  07/11/2019  . Heart burn 01/27/2019  . Migraine without aura and without status migrainosus, not intractable 03/14/2017  . Panic disorder 07/08/2015  . Nicotine dependence, cigarettes, uncomplicated 07/08/2015  . Cannabis use disorder, moderate, dependence (HCC) 07/08/2015  . Dysmenorrhea in adolescent 06/02/2015  . Multiple sclerosis (HCC) 10/26/2014  . Depression 02/27/2014  . Anxiety 01/20/2014  . Vitamin D deficiency 12/29/2013    Home Medications:    No outpatient medications have been marked as taking for the 08/25/19 encounter [facility] Encounter).    Allergies:   Reglan [metoclopramide], Ibuprofen, Penicillins, Sulfa antibiotics, and Sulfasalazine  Review of Systems (ROS):  Review of systems NEGATIVE unless otherwise noted in narrative H&P section.   Vital Signs: Today's Vitals   08/25/19 1230 08/25/19 1232  BP: (!) 130/98   Pulse: 99   Resp: 16   Temp: 98.4 F (36.9 C)   TempSrc: Oral   SpO2: 99%   Weight:  140 lb (63.5 kg)  Height:  5\' 4"  (1.626 m)  PainSc: 2      Physical Exam: Physical Exam  Constitutional: She is oriented to person, place, and time and well-developed, well-nourished, and in no distress.  HENT:  Head: Normocephalic and atraumatic.  Eyes: Pupils are equal, round, and reactive to light.  Cardiovascular: Normal rate and intact distal pulses.  Pulmonary/Chest: Effort normal. No respiratory distress.  Musculoskeletal:     Left ankle: Swelling present. No deformity or ecchymosis.  Tenderness present over the medial malleolus. Normal range of motion. Normal pulse.  Neurological: She is alert and oriented to person, place, and time. She has normal sensation, normal strength and normal reflexes. Gait (2/2 acute pain) abnormal.  Skin: Skin is warm and dry. No rash noted. She is not diaphoretic.  Psychiatric: Mood, memory, affect and judgment normal.  Nursing note and vitals reviewed.   Urgent Care Treatments / Results:   Orders Placed This  Encounter  Procedures  . DG Ankle Complete Left  . Apply ASO lace-up ankle brace    LABS: PLEASE NOTE: all labs that were ordered this encounter are listed, however only abnormal results are displayed. Labs Reviewed - No data to display  EKG: -None  RADIOLOGY: DG Ankle Complete Left  Result Date: 08/25/2019 CLINICAL DATA:  Left ankle pain and swelling.  No injury. EXAM: LEFT ANKLE COMPLETE - 3+ VIEW COMPARISON:  None. FINDINGS: No acute fracture or dislocation. The ankle mortise is symmetric. The talar dome is intact. No tibiotalar joint effusion. Joint spaces are preserved. Bone mineralization is normal. Soft tissues are unremarkable. IMPRESSION: Negative. Electronically Signed   By: Titus Dubin M.D.   On: 08/25/2019 12:54    PROCEDURES: Procedures  MEDICATIONS RECEIVED THIS VISIT: Medications - No data to display  PERTINENT CLINICAL COURSE NOTES/UPDATES:   Initial Impression / Assessment and Plan / Urgent Care Course:  Pertinent labs & imaging results that were available during my care of the patient were personally reviewed by me and considered in my medical decision making (see lab/imaging section of note for values and interpretations).  Jennifer Watts is a 24 y.o. female who presents to [facility] today with complaints of Ankle Pain  Patient is well appearing overall in clinic today. She does not appear to be in any acute distress. Presenting symptoms (see HPI) and exam as documented above. Symptoms worsening over the last 3-4 days. Patient with pain and swelling. Diagnostic radiographs of the LEFT ankle revealed no acute abnormalities; no fracture, dislocation, or effusion. Suspect posterior tibial tendonitis given her exam and current symptoms. Pain worsens after patient has been ambulating. Patient placed in ASO brace for comfort and support. Will pursue treatment using systemic steroid taper (prednisone). She was educated on complimentary modalities to help  with her pain. Patient encouraged to rest, ice, and elevate her ankle to help reduce the pain and inflammation. Advised her that ice should be applied TID-QID for at least 15-20 minutes at a time; written information provided on today's AVS. Patient wishes to have something mild on hand for more severe pain citing that her pain intermittently exacerbates. Rx sent in for Tramadol; indications and side effects reviewed. Patient discharged home. If not improving, she will need to see podiatry for further evaluation and treatment.   Discussed follow up with primary care physician in 1 week for re-evaluation. I have reviewed the follow up and strict return precautions for any new or worsening symptoms. Patient is aware of symptoms that would be deemed urgent/emergent, and would thus require further evaluation either here or in the emergency department. At the time of discharge, she verbalized understanding and consent with the discharge plan as it was reviewed with her. All questions were fielded by provider and/or clinic staff prior to patient discharge.   ADDENDUM: Patient's mother returns to the front desk asking for a different intervention for pain. Patient has already used Tramadol at home and it was ineffective for her pain. Mother had some Tramadol  and gave patient medication in efforts to reduce her pain, however she was not able to obtain any relief. Dicussed alternatives. Will provide a limited supply of Norco 5/325 mg tablets for patient to use over the next few days for moderate to severe pain.    Final Clinical Impressions / Urgent Care Diagnoses:   Final diagnoses:  Tibialis posterior tendonitis, left    New Prescriptions:  [place] Controlled Substance Registry consulted? Yes, I have consulted the [place] Controlled Substances Registry for this patient, and feel the risk/benefit ratio today is favorable for proceeding with this prescription for a controlled substance.  . Discussed use of controlled  substance medication to treat her acute pain.  o Reviewed [place] STOP Act regulations  o Clinic does not refill controlled substances over the phone without face to face evaluation.  . Safety precautions reviewed.  o Medications should not be bitten, chewed, sold, or taken with alcohol.  o Avoid use while working, driving, or operating heavy machinery.  o Side effects associated with the use of this particular medication reviewed. - Patient understands that this medication can cause CNS depression, increase her risk of falls, and even lead to overdose that may result in death, if used outside of the parameters that she and I discussed.  With all of this in mind, she knowingly accepts the risks and responsibilities associated with intended course of treatment, and elects to responsibly proceed as discussed.  Meds ordered this encounter  Medications  . predniSONE (STERAPRED UNI-PAK 21 TAB) 10 MG (21) TBPK tablet    Sig: Take by mouth daily. 60 mg x 1 day, 50 mg x 1 day, 40 mg x 1 day, 30 mg x 1 day, 20 mg x 1 day, 10 mg x 1 day    Dispense:  21 tablet    Refill:  0  . DISCONTD: traMADol (ULTRAM) 50 MG tablet    Sig: Take 1 tablet (50 mg total) by mouth every 12 (twelve) hours as needed.    Dispense:  10 tablet    Refill:  0  . HYDROcodone-acetaminophen (NORCO) 5-325 MG tablet    Sig: Take 1 tablet by mouth 2 (two) times daily as needed for moderate pain.    Dispense:  10 tablet    Refill:  0    Cancel Tramadol - patient request    Recommended Follow up Care:  Patient encouraged to follow up with the following provider within the specified time frame, or sooner as dictated by the severity of her symptoms. As always, she was instructed that for any urgent/emergent care needs, she should seek care either here or in the emergency department for more immediate evaluation.  Follow-up Information    Aura Dials T, NP In 1 week.   Specialty: Nurse Practitioner Why: General reassessment of  symptoms if not improving Contact information: [address] [place] [address] [phone]         NOTE: This note was prepared using Dragon dictation software along with smaller phrase technology. Despite my best ability to proofread, there is the potential that transcriptional errors may still occur from this process, and are completely unintentional.    Verlee Monte, NP 08/27/19 1122

## 2019-09-03 ENCOUNTER — Encounter: Payer: Self-pay | Admitting: Nurse Practitioner

## 2019-09-29 ENCOUNTER — Telehealth: Payer: Self-pay | Admitting: Nurse Practitioner

## 2019-09-29 NOTE — Telephone Encounter (Signed)
Called pt scheduled for 5/21

## 2019-09-29 NOTE — Telephone Encounter (Signed)
Would need appointment if worsening symptoms.

## 2019-09-29 NOTE — Telephone Encounter (Signed)
Pt called and stated that the Rx for omeprazole (PRILOSEC) 20 MG capsule  Is not working and actually seems to be making things worst / please advise

## 2019-10-03 ENCOUNTER — Ambulatory Visit: Payer: Medicaid Other | Admitting: Nurse Practitioner

## 2019-10-03 ENCOUNTER — Other Ambulatory Visit: Payer: Self-pay | Admitting: Nurse Practitioner

## 2019-10-03 MED ORDER — FAMOTIDINE 20 MG PO TABS: 20.0000 mg | ORAL_TABLET | Freq: Two times a day (BID) | ORAL | 3 refills | Status: DC

## 2019-10-14 DIAGNOSIS — F411 Generalized anxiety disorder: Secondary | ICD-10-CM | POA: Diagnosis not present

## 2019-10-14 DIAGNOSIS — F419 Anxiety disorder, unspecified: Secondary | ICD-10-CM | POA: Diagnosis not present

## 2019-10-14 DIAGNOSIS — E538 Deficiency of other specified B group vitamins: Secondary | ICD-10-CM | POA: Diagnosis not present

## 2019-10-14 DIAGNOSIS — G35 Multiple sclerosis: Secondary | ICD-10-CM | POA: Diagnosis not present

## 2019-11-11 DIAGNOSIS — G43009 Migraine without aura, not intractable, without status migrainosus: Secondary | ICD-10-CM | POA: Diagnosis not present

## 2019-11-13 ENCOUNTER — Emergency Department
Admission: EM | Admit: 2019-11-13 | Discharge: 2019-11-13 | Disposition: A | Discharge status: Home / Self Care | Payer: Medicaid Other | Attending: Emergency Medicine | Admitting: Emergency Medicine

## 2019-11-13 ENCOUNTER — Ambulatory Visit: Payer: Self-pay

## 2019-11-13 ENCOUNTER — Other Ambulatory Visit: Payer: Self-pay

## 2019-11-13 DIAGNOSIS — F17210 Nicotine dependence, cigarettes, uncomplicated: Secondary | ICD-10-CM | POA: Insufficient documentation

## 2019-11-13 DIAGNOSIS — N3001 Acute cystitis with hematuria: Secondary | ICD-10-CM | POA: Insufficient documentation

## 2019-11-13 DIAGNOSIS — R350 Frequency of micturition: Secondary | ICD-10-CM | POA: Diagnosis present

## 2019-11-13 LAB — URINALYSIS, COMPLETE (UACMP) WITH MICROSCOPIC
Bilirubin Urine: NEGATIVE
Glucose, UA: NEGATIVE mg/dL
Ketones, ur: NEGATIVE mg/dL
Nitrite: NEGATIVE
Protein, ur: NEGATIVE mg/dL
Specific Gravity, Urine: <1.005 — ABNORMAL LOW (ref 1.005–1.030)
pH: 6.5 (ref 5.0–8.0)

## 2019-11-13 LAB — POCT PREGNANCY, URINE: Preg Test, Ur: NEGATIVE

## 2019-11-13 MED ORDER — CEPHALEXIN 500 MG PO CAPS
500.0000 mg | ORAL_CAPSULE | Freq: Once | ORAL | Status: AC
  Administered 2019-11-13: 500 mg via ORAL
  Filled 2019-11-13: qty 1

## 2019-11-13 MED ORDER — CEPHALEXIN 500 MG PO CAPS: 500.0000 mg | ORAL_CAPSULE | Freq: Four times a day (QID) | ORAL | 0 refills | Status: AC

## 2019-11-13 MED ORDER — ONDANSETRON 4 MG PO TBDP: 4.0000 mg | ORAL_TABLET | Freq: Three times a day (TID) | ORAL | 0 refills | Status: DC | PRN

## 2019-11-13 MED ORDER — ONDANSETRON 4 MG PO TBDP
4.0000 mg | ORAL_TABLET | Freq: Once | ORAL | Status: AC
  Administered 2019-11-13: 4 mg via ORAL
  Filled 2019-11-13: qty 1

## 2019-11-13 NOTE — Discharge Instructions (Addendum)
Please make sure you are drinking lots of fluids.  Take antibiotics as prescribed.  Return to the ER for any back pain, fevers, vomiting

## 2019-11-13 NOTE — Telephone Encounter (Signed)
  Patient called stating that she had a toradol injection  Tuesday for migraine and she states that she develop severe cramping to her lower abdomin and it feels like she is not emptying her bladder. She states that the sensation and cramping went away but has come back today.  She rates the abdominal cramping at 7 and constant.  She has severe pressure in her vaginal area. She has voided small amounts each time she goes but still continues to feel the pain and pressure and the sensation of a full bladder.  She states that her urine is clear to light yellow.  She states last time she went it was just dribbles. She has back pain but states it is her normal.  She has had many UTI's in the past.  Per protocol patient will go to ER for evaluation. Office will be notified. Reason for Disposition . [1] Unable to urinate (or only a few drops) > 4 hours AND [2] bladder feels very full (e.g., palpable bladder or strong urge to urinate)  Answer Assessment - Initial Assessment Questions 1. SEVERITY: "How bad is the pain?"  (e.g., Scale 1-10; mild, moderate, or severe)   - MILD (1-3): complains slightly about urination hurting   - MODERATE (4-7): interferes with normal activities     - SEVERE (8-10): excruciating, unwilling or unable to urinate because of the pain      7 2. FREQUENCY: "How many times have you had painful urination today?"      Cramping is constant 3. PATTERN: "Is pain present every time you urinate or just sometimes?"     constant 4. ONSET: "When did the painful urination start?"     After Toradol Monday or tuesday 5. FEVER: "Do you have a fever?" If Yes, ask: "What is your temperature, how was it measured, and when did it start?"     no 6. PAST UTI: "Have you had a urine infection before?" If Yes, ask: "When was the last time?" and "What happened that time?"      UTI multiple feels more like bladder not emptning 7. CAUSE: "What do you think is causing the painful urination?"  (e.g., UTI,  scratch, Herpes sore)     unsure 8. OTHER SYMPTOMS: "Do you have any other symptoms?" (e.g., flank pain, vaginal discharge, genital sores, urgency, blood in urine)    Back pain but normal vagina is uncomfortable,  9. PREGNANCY: "Is there any chance you are pregnant?" "When was your last menstrual period?"     No birth control  Protocols used: URINATION PAIN [facility]

## 2019-11-13 NOTE — Telephone Encounter (Signed)
Noted, will follow-up with her after ER visit.

## 2019-11-13 NOTE — ED Triage Notes (Signed)
Pt arrives via POV from home for reports of lower abdominal cramping, vaginal pressure and lower back pain since Tuesday. Pt states she has had trouble emptying her bladder at times but she is able to urinate. Pt ambulatory from triage in NAD, skin warm and dry.

## 2019-11-13 NOTE — ED Provider Notes (Signed)
[facility] EMERGENCY DEPARTMENT Provider Note   CSN: [id number] Arrival date & time: 11/13/19  1635     History Chief Complaint  Patient presents with  . Urinary Retention    Jennifer Watts is a 24 y.o. female presents to the emergency department evaluation of painful urination and increased urinary frequency.  Patient states when she goes she does not go normal amounts.  She has had cramping and pelvic pressure.  She has had a little bit of back discomfort.  No fevers nausea or vomiting.  HPI     Past Medical History:  Diagnosis Date  . Anxiety   . Depression   . MS (multiple sclerosis) (HCC)   . Neuromuscular disorder [facility])     Patient Active Problem List   Diagnosis Date Noted  . Dermatitis 07/11/2019  . Heart burn 01/27/2019  . Migraine without aura and without status migrainosus, not intractable 03/14/2017  . Panic disorder 07/08/2015  . Nicotine dependence, cigarettes, uncomplicated 07/08/2015  . Cannabis use disorder, moderate, dependence (HCC) 07/08/2015  . Dysmenorrhea in adolescent 06/02/2015  . Multiple sclerosis (HCC) 10/26/2014  . Depression 02/27/2014  . Anxiety 01/20/2014  . Vitamin D deficiency 12/29/2013    Past Surgical History:  Procedure Laterality Date  . TYMPANOSTOMY TUBE PLACEMENT       OB History   No obstetric history on file.     Family History  Problem Relation Age of Onset  . Hypertension Maternal Grandmother   . Cancer Paternal Grandfather        throat  . Multiple sclerosis Paternal Grandfather   . Anxiety disorder Mother     Social History   Tobacco Use  . Smoking status: Current Every Day Smoker    Packs/day: 0.25    Types: Cigarettes  . Smokeless tobacco: Never Used  Vaping Use  . Vaping Use: Former  Substance Use Topics  . Alcohol use: Yes    Comment: social  . Drug use: No    Home Medications Prior to Admission medications   Medication Sig Start Date End Date Taking? Authorizing  Provider  ALPRAZolam (XANAX) 0.25 MG tablet TAKE 1 TABLET BY MOUTH THREE TIMES A DAY AS NEEDED 07/08/18   [provider]  cephALEXin (KEFLEX) 500 MG capsule Take 1 capsule (500 mg total) by mouth 4 (four) times daily for 7 days. 11/13/19 11/20/19  Evon Slack, PA-C  cholecalciferol (VITAMIN D3) 25 MCG (1000 UT) tablet Take 1,000 Units by mouth daily.    [provider]  citalopram (CELEXA) 40 MG tablet TAKE 1 TABLET BY MOUTH EVERY DAY 02/22/18   [provider]  clobetasol cream (TEMOVATE) 0.05 % Apply 1 application topically 2 (two) times daily. 07/15/19   Cannady, Corrie Dandy T, NP  Cyanocobalamin (B-12 PO) Take 1 tablet by mouth daily.    [provider]  famotidine (PEPCID) 20 MG tablet Take 1 tablet (20 mg total) by mouth 2 (two) times daily. 10/03/19   Cannady, Corrie Dandy T, NP  HYDROcodone-acetaminophen (NORCO) 5-325 MG tablet Take 1 tablet by mouth 2 (two) times daily as needed for moderate pain. 08/25/19   Verlee Monte, NP  medroxyPROGESTERone (DEPO-PROVERA) 150 MG/ML injection INJECT 1 ML (150 MG TOTAL) INTO THE MUSCLE EVERY 3 (THREE) MONTHS. 01/24/19   Cannady, Corrie Dandy T, NP  nortriptyline (PAMELOR) 10 MG capsule TAKE 1 CAPSULE BY MOUTH NIGHTLY FOR ONE WEEK, THEN INCREASE TO 2 CAPSULES NIGHTLY 06/28/18   [provider]  ondansetron (ZOFRAN ODT) 4  MG disintegrating tablet Take 1 tablet (4 mg total) by mouth every 8 (eight) hours as needed for nausea or vomiting. 11/13/19   Evon Slack, PA-C  predniSONE (STERAPRED UNI-PAK 21 TAB) 10 MG (21) TBPK tablet Take by mouth daily. 60 mg x 1 day, 50 mg x 1 day, 40 mg x 1 day, 30 mg x 1 day, 20 mg x 1 day, 10 mg x 1 day 08/25/19   Verlee Monte, NP  promethazine (PHENERGAN) 12.5 MG tablet Take 1 tablet (12.5 mg total) by mouth every 6 (six) hours as needed for nausea or vomiting. 05/02/18   Milagros Loll, MD  SUMAtriptan (IMITREX) 100 MG tablet Take 50-100 mg by mouth daily as needed. 01/08/17   [provider]   traZODone (DESYREL) 100 MG tablet TAKE 1 TABLET BY MOUTH EVERY DAY AT NIGHT 01/28/19   [provider]    Allergies    Reglan [metoclopramide], Ibuprofen, Penicillins, Sulfa antibiotics, and Sulfasalazine  Review of Systems   Review of Systems  Constitutional: Negative for fever.  Genitourinary: Positive for dysuria, frequency and urgency. Negative for difficulty urinating, vaginal bleeding and vaginal discharge.  Musculoskeletal: Negative for back pain.  Skin: Negative for rash and wound.    Physical Exam Updated Vital Signs BP (!) 142/86 (BP Location: Right Arm)   Pulse 95   Temp 98.8 F (37.1 C) (Oral)   Resp 18   Ht 5\' 5"  (1.651 m)   Wt 63.5 kg   SpO2 98%   BMI 23.30 kg/m   Physical Exam Constitutional:      Appearance: She is well-developed.  HENT:     Head: Normocephalic and atraumatic.  Eyes:     Conjunctiva/sclera: Conjunctivae normal.  Cardiovascular:     Rate and Rhythm: Normal rate.  Pulmonary:     Effort: Pulmonary effort is normal. No respiratory distress.  Abdominal:     General: There is no distension.     Palpations: Abdomen is soft. There is no mass.     Tenderness: There is no abdominal tenderness. There is no guarding.  Musculoskeletal:        General: Normal range of motion.     Cervical back: Normal range of motion.     Comments: No CVA tenderness.  Skin:    General: Skin is warm.     Findings: No rash.  Neurological:     Mental Status: She is alert and oriented to person, place, and time.  Psychiatric:        Behavior: Behavior normal.        Thought Content: Thought content normal.     ED Results / Procedures / Treatments   Labs (all labs ordered are listed, but only abnormal results are displayed) Labs Reviewed  URINALYSIS, COMPLETE (UACMP) WITH MICROSCOPIC - Abnormal; Notable for the following components:      Result Value   Specific Gravity, Urine <1.005 (*)    Hgb urine dipstick MODERATE (*)    Leukocytes,Ua LARGE  (*)    Bacteria, UA RARE (*)    All other components within normal limits  POC URINE PREG, ED  POCT PREGNANCY, URINE    EKG None  Radiology No results found.  Procedures Procedures (including critical care time)  Medications Ordered in ED Medications  cephALEXin (KEFLEX) capsule 500 mg (has no administration in time range)  ondansetron (ZOFRAN-ODT) disintegrating tablet 4 mg (has no administration in time range)    ED Course  I have reviewed the triage  vital signs and the nursing notes.  Pertinent labs & imaging results that were available during my care of the patient were reviewed by me and considered in my medical decision making (see chart for details).    MDM Rules/Calculators/A&P                          24 year old female with history exam and lab findings consistent with UTI with hematuria.  She is started on oral antibiotic.  Patient states all antibiotics cause her to have nausea, she is agreeable to Zofran.  Currently without nausea or vomiting.  She understands signs symptoms return to the ER for. Final Clinical Impression(s) / ED Diagnoses Final diagnoses:  Acute cystitis with hematuria    Rx / DC Orders ED Discharge Orders         Ordered    cephALEXin (KEFLEX) 500 MG capsule  4 times daily     Discontinue  Reprint     11/13/19 1719    ondansetron (ZOFRAN ODT) 4 MG disintegrating tablet  Every 8 hours PRN     Discontinue  Reprint     11/13/19 1719           Evon Slack, PA-C 11/13/19 1725    Arnaldo Natal, MD 11/14/19 [phone]

## 2019-11-13 NOTE — ED Notes (Signed)
Pt states that she is having abdominal cramping and that her vagina feels uncomfortable. Pt states that as soon as she pee's she feels like she has to pee again and that she's had some nausea from the pain. Pt denies smell to urine, difficulty/pain when urinating, or fevers.

## 2019-11-25 ENCOUNTER — Encounter: Payer: Self-pay | Admitting: Nurse Practitioner

## 2019-11-25 ENCOUNTER — Other Ambulatory Visit: Payer: Self-pay

## 2019-11-25 ENCOUNTER — Ambulatory Visit (INDEPENDENT_AMBULATORY_CARE_PROVIDER_SITE_OTHER): Payer: Medicaid Other | Admitting: Nurse Practitioner

## 2019-11-25 VITALS — BP 116/79 | HR 98 | Temp 98.4°F | Wt 150.6 lb

## 2019-11-25 DIAGNOSIS — B9689 Other specified bacterial agents as the cause of diseases classified elsewhere: Secondary | ICD-10-CM

## 2019-11-25 DIAGNOSIS — N3001 Acute cystitis with hematuria: Secondary | ICD-10-CM | POA: Diagnosis not present

## 2019-11-25 DIAGNOSIS — N760 Acute vaginitis: Secondary | ICD-10-CM

## 2019-11-25 DIAGNOSIS — N390 Urinary tract infection, site not specified: Secondary | ICD-10-CM | POA: Diagnosis not present

## 2019-11-25 LAB — URINALYSIS, ROUTINE W REFLEX MICROSCOPIC
Bilirubin, UA: NEGATIVE
Glucose, UA: NEGATIVE
Ketones, UA: NEGATIVE
Nitrite, UA: NEGATIVE
Protein,UA: NEGATIVE
Specific Gravity, UA: 1.02 (ref 1.005–1.030)
Urobilinogen, Ur: 0.2 mg/dL (ref 0.2–1.0)
pH, UA: 5.5 (ref 5.0–7.5)

## 2019-11-25 LAB — MICROSCOPIC EXAMINATION

## 2019-11-25 LAB — WET PREP FOR TRICH, YEAST, CLUE
Clue Cell Exam: POSITIVE — AB
Trichomonas Exam: NEGATIVE
Yeast Exam: NEGATIVE

## 2019-11-25 MED ORDER — METRONIDAZOLE 0.75 % VA GEL: 1.0000 | Freq: Every day | VAGINAL | 2 refills | Status: DC

## 2019-11-25 MED ORDER — METRONIDAZOLE 500 MG PO TABS: 500.0000 mg | ORAL_TABLET | Freq: Two times a day (BID) | ORAL | 0 refills | Status: AC

## 2019-11-25 MED ORDER — METRONIDAZOLE 0.75 % VA GEL: 1.0000 | Freq: Every day | VAGINAL | 0 refills | Status: AC

## 2019-11-25 NOTE — Progress Notes (Signed)
BP 116/79   Pulse 98   Temp 98.4 F (36.9 C) (Oral)   Wt 150 lb 9.6 oz (68.3 kg)   SpO2 95%   BMI 25.06 kg/m    Subjective:    Patient ID: Jennifer Watts, female    DOB: 1996/01/06, 24 y.o.   MRN: [id number]  HPI: Jennifer Watts is a 24 y.o. female  Chief Complaint  Patient presents with  . Urinary Tract Infection   ER FOLLOW UP Seen in ER on 11/13/2019 for urinary retention, was diagnosed with UTI and sent home with Keflex + Zofran.  Overall she reports feeling better now, is able to urinate without issue. Time since discharge: 12 days Hospital/facility: [facility] Diagnosis: acute cystitis with hematuria Procedures/tests: urinalysis Consultants: none New medications: Keflex and Zofran Discharge instructions:  Follow-up with PCP Status: better  URINARY SYMPTOMS Recent treatment 11/13/2019 with Keflex.  She has history of frequent BV infections.  Has been seen by GYN in past, Dr. Dalbert Garnet last 06/25/19. Dysuria: no Urinary frequency: no Urgency: no Small volume voids: no Symptom severity: no Urinary incontinence: no Foul odor: no Hematuria: no Abdominal pain: occasional Back pain: no Suprapubic pain/pressure: no Flank pain: no Fever:  no Vomiting: no Status: better Previous urinary tract infection: yes Recurrent urinary tract infection: no Sexual activity: monogomous History of sexually transmitted disease: no Treatments attempted: antibiotics and increasing fluids   Relevant past medical, surgical, family and social history reviewed and updated as indicated. Interim medical history since our last visit reviewed. Allergies and medications reviewed and updated.  Review of Systems  Constitutional: Negative for activity change, appetite change, diaphoresis, fatigue and fever.  Respiratory: Negative for cough, chest tightness and shortness of breath.   Cardiovascular: Negative for chest pain, palpitations and leg swelling.  Gastrointestinal: Positive for abdominal  pain (suprapubic). Negative for abdominal distention, constipation, diarrhea, nausea and vomiting.  Genitourinary: Negative for dysuria, frequency, hematuria, urgency and vaginal discharge.  Neurological: Negative.   Psychiatric/Behavioral: Negative.     Per HPI unless specifically indicated above     Objective:    BP 116/79   Pulse 98   Temp 98.4 F (36.9 C) (Oral)   Wt 150 lb 9.6 oz (68.3 kg)   SpO2 95%   BMI 25.06 kg/m   Wt Readings from Last 3 Encounters:  11/25/19 150 lb 9.6 oz (68.3 kg)  11/13/19 140 lb (63.5 kg)  08/25/19 140 lb (63.5 kg)    Physical Exam Vitals and nursing note reviewed.  Constitutional:      General: She is awake. She is not in acute distress.    Appearance: She is well-developed and well-groomed. She is not ill-appearing.  HENT:     Head: Normocephalic.     Right Ear: Hearing normal.     Left Ear: Hearing normal.  Eyes:     General: Lids are normal.        Right eye: No discharge.        Left eye: No discharge.     Conjunctiva/sclera: Conjunctivae normal.     Pupils: Pupils are equal, round, and reactive to light.  Cardiovascular:     Rate and Rhythm: Normal rate and regular rhythm.     Heart sounds: Normal heart sounds. No murmur heard.  No gallop.   Pulmonary:     Effort: Pulmonary effort is normal. No accessory muscle usage or respiratory distress.     Breath sounds: Normal breath sounds.  Abdominal:     General: Bowel  sounds are normal. There is no distension.     Palpations: Abdomen is soft. There is no hepatomegaly or splenomegaly.     Tenderness: There is abdominal tenderness in the suprapubic area. There is no right CVA tenderness or left CVA tenderness.  Musculoskeletal:     Cervical back: Normal range of motion and neck supple.     Right lower leg: No edema.     Left lower leg: No edema.  Skin:    General: Skin is warm and dry.  Neurological:     Mental Status: She is alert and oriented to person, place, and time.   Psychiatric:        Attention and Perception: Attention normal.        Mood and Affect: Mood normal.        Speech: Speech normal.        Behavior: Behavior normal. Behavior is cooperative.        Thought Content: Thought content normal.     Results for orders placed or performed during the hospital encounter of 11/13/19  Urinalysis, Complete w Microscopic  Result Value Ref Range   Color, Urine YELLOW YELLOW   APPearance CLEAR CLEAR   Specific Gravity, Urine <1.005 (L) 1.005 - 1.030   pH 6.5 5.0 - 8.0   Glucose, UA NEGATIVE NEGATIVE mg/dL   Hgb urine dipstick MODERATE (A) NEGATIVE   Bilirubin Urine NEGATIVE NEGATIVE   Ketones, ur NEGATIVE NEGATIVE mg/dL   Protein, ur NEGATIVE NEGATIVE mg/dL   Nitrite NEGATIVE NEGATIVE   Leukocytes,Ua LARGE (A) NEGATIVE   Squamous Epithelial / LPF 0-5 0 - 5   WBC, UA 21-50 0 - 5 WBC/hpf   RBC / HPF 0-5 0 - 5 RBC/hpf   Bacteria, UA RARE (A) NONE SEEN  Pregnancy, urine POC  Result Value Ref Range   Preg Test, Ur NEGATIVE NEGATIVE      Assessment & Plan:   Problem List Items Addressed This Visit      Genitourinary   Bacterial vaginosis - Primary    Recurrent, no recent infection.  UA today noting trace BLD, 2+ LEUK, few bacteria.  Wet prep + Clue Cells, neg yeast and trich.  She would like to try oral Flagyl first and if GI stomach with will change to gel, sent in scripts.  Discussed plan of care with patient.  Educated on using gel daily and proper hygiene techniques for vaginal health.  Consider return to GYN if ongoing recurrence.  Return to office for continued or worsening issues.      Relevant Medications   metroNIDAZOLE (FLAGYL) 500 MG tablet   Other Relevant Orders   Urinalysis, Routine w reflex microscopic   WET PREP FOR TRICH, YEAST, CLUE    Other Visit Diagnoses    Acute cystitis with hematuria       Symptoms improving, suspect related more to bacterial vaginosis, has had recurrent infections with this in past.  Refer to BV  plan of care.       Follow up plan: Return for as scheduled in August.

## 2019-11-25 NOTE — Assessment & Plan Note (Signed)
Recurrent, no recent infection.  UA today noting trace BLD, 2+ LEUK, few bacteria.  Wet prep + Clue Cells, neg yeast and trich.  She would like to try oral Flagyl first and if GI stomach with will change to gel, sent in scripts.  Discussed plan of care with patient.  Educated on using gel daily and proper hygiene techniques for vaginal health.  Consider return to GYN if ongoing recurrence.  Return to office for continued or worsening issues.

## 2019-11-25 NOTE — Patient Instructions (Signed)
Urinary Tract Infection, Adult A urinary tract infection (UTI) is an infection of any part of the urinary tract. The urinary tract includes:  The kidneys.  The ureters.  The bladder.  The urethra. These organs make, store, and get rid of pee (urine) in the body. What are the causes? This is caused by germs (bacteria) in your genital area. These germs grow and cause swelling (inflammation) of your urinary tract. What increases the risk? You are more likely to develop this condition if:  You have a small, thin tube (catheter) to drain pee.  You cannot control when you pee or poop (incontinence).  You are female, and: ? You use these methods to prevent pregnancy:  A medicine that kills sperm (spermicide).  A device that blocks sperm (diaphragm). ? You have low levels of a female hormone (estrogen). ? You are pregnant.  You have genes that add to your risk.  You are sexually active.  You take antibiotic medicines.  You have trouble peeing because of: ? A prostate that is bigger than normal, if you are female. ? A blockage in the part of your body that drains pee from the bladder (urethra). ? A kidney stone. ? A nerve condition that affects your bladder (neurogenic bladder). ? Not getting enough to drink. ? Not peeing often enough.  You have other conditions, such as: ? Diabetes. ? A weak disease-fighting system (immune system). ? Sickle cell disease. ? Gout. ? Injury of the spine. What are the signs or symptoms? Symptoms of this condition include:  Needing to pee right away (urgently).  Peeing often.  Peeing small amounts often.  Pain or burning when peeing.  Blood in the pee.  Pee that smells bad or not like normal.  Trouble peeing.  Pee that is cloudy.  Fluid coming from the vagina, if you are female.  Pain in the belly or lower back. Other symptoms include:  Throwing up (vomiting).  No urge to eat.  Feeling mixed up (confused).  Being tired  and grouchy (irritable).  A fever.  Watery poop (diarrhea). How is this treated? This condition may be treated with:  Antibiotic medicine.  Other medicines.  Drinking enough water. Follow these instructions at home:  Medicines  Take over-the-counter and prescription medicines only as told by your doctor.  If you were prescribed an antibiotic medicine, take it as told by your doctor. Do not stop taking it even if you start to feel better. General instructions  Make sure you: ? Pee until your bladder is empty. ? Do not hold pee for a long time. ? Empty your bladder after sex. ? Wipe from front to back after pooping if you are a female. Use each tissue one time when you wipe.  Drink enough fluid to keep your pee pale yellow.  Keep all follow-up visits as told by your doctor. This is important. Contact a doctor if:  You do not get better after 1-2 days.  Your symptoms go away and then come back. Get help right away if:  You have very bad back pain.  You have very bad pain in your lower belly.  You have a fever.  You are sick to your stomach (nauseous).  You are throwing up. Summary  A urinary tract infection (UTI) is an infection of any part of the urinary tract.  This condition is caused by germs in your genital area.  There are many risk factors for a UTI. These include having a small, thin   tube to drain pee and not being able to control when you pee or poop.  Treatment includes antibiotic medicines for germs.  Drink enough fluid to keep your pee pale yellow. This information is not intended to replace advice given to you by your health care provider. Make sure you discuss any questions you have with your health care provider. Document Revised: 04/18/2018 Document Reviewed: 11/08/2017 Elsevier Patient Education  2020 [employer]vier Inc[employer]  

## 2019-12-22 ENCOUNTER — Ambulatory Visit: Payer: Medicaid Other | Admitting: Nurse Practitioner

## 2019-12-24 ENCOUNTER — Telehealth (INDEPENDENT_AMBULATORY_CARE_PROVIDER_SITE_OTHER): Payer: Medicaid Other | Admitting: Nurse Practitioner

## 2019-12-24 ENCOUNTER — Encounter: Payer: Self-pay | Admitting: Nurse Practitioner

## 2019-12-24 DIAGNOSIS — F419 Anxiety disorder, unspecified: Secondary | ICD-10-CM | POA: Diagnosis not present

## 2019-12-24 DIAGNOSIS — F330 Major depressive disorder, recurrent, mild: Secondary | ICD-10-CM

## 2019-12-24 DIAGNOSIS — N898 Other specified noninflammatory disorders of vagina: Secondary | ICD-10-CM | POA: Diagnosis not present

## 2019-12-24 MED ORDER — SERTRALINE HCL 50 MG PO TABS: 50.0000 mg | ORAL_TABLET | Freq: Every day | ORAL | 3 refills | Status: DC

## 2019-12-24 NOTE — Assessment & Plan Note (Signed)
Chronic, ongoing with recent increased anxiety reported.  Would like to try switching to different SSRI.  Will do direct switch from Celexa 40 MG to Sertraline 50 MG, script sent and educated patient on medication and change.  Denies SI/HI.  Continue Xanax as prescribed by neurology.  Return in 6 weeks.

## 2019-12-24 NOTE — Progress Notes (Signed)
There were no vitals taken for this visit.   Subjective:    Patient ID: Jennifer Watts, female    DOB: 1996-04-14, 24 y.o.   MRN: [id number]  HPI: Jennifer Watts is a 24 y.o. female  Chief Complaint  Patient presents with  . Depression  . Vaginal Discharge    pt states she has been having some itching and a little discharge, states she recently finished antibiotic. States she has Fluconazole at home and wants to know if she can take it    . This visit was completed via MyChart due to the restrictions of the COVID-19 pandemic. All issues as above were discussed and addressed. Physical exam was done as above through visual confirmation on MyChart. If it was felt that the patient should be evaluated in the office, they were directed there. The patient verbally consented to this visit. . Location of the patient: home . Location of the provider: work . Those involved with this call:  . Provider: Aura Dials, DNP . CMA: Wilhemena Durie, CMA . Front Desk/Registration: Adela Ports  . Time spent on call: 20 minutes with patient face to face via video conference. More than 50% of this time was spent in counseling and coordination of care. 15 minutes total spent in review of patient's record and preparation of their chart.  . I verified patient identity using two factors (patient name and date of birth). Patient consents verbally to being seen via telemedicine visit today.    DEPRESSION Currently taking Celexa 40 MG daily + Trazodone 100 MG at night.  Takes Pamelor and Xanax as ordered by neurology.  Does report occasional increased anxiety recently -- would like to try switching off SSRI to a different one.  Has taken Prozac in past with no benefit. Mood status: stable Satisfied with current treatment?: yes Symptom severity: mild  Duration of current treatment : chronic Side effects: no Medication compliance: good compliance Psychotherapy/counseling: in past Depressed mood:  yes Anxious mood: yes Anhedonia: no Significant weight loss or gain: no Insomnia: yes hard to stay asleep Fatigue: occasional Feelings of worthlessness or guilt: no Impaired concentration/indecisiveness: no Suicidal ideations: no Hopelessness: no Crying spells: x 1 episode Depression screen [facility] 2/9 12/24/2019 07/11/2019 01/03/2019 07/23/2018 06/02/2015  Decreased Interest 1 0 0 0 3  Down, Depressed, Hopeless 1 0 0 0 2  PHQ - 2 Score 2 0 0 0 5  Altered sleeping 1 0 1 - 2  Tired, decreased energy 2 0 0 - 2  Change in appetite 0 2 0 - 0  Feeling bad or failure about yourself  1 0 0 - 2  Trouble concentrating 1 0 1 - 0  Moving slowly or fidgety/restless 0 0 0 - 1  Suicidal thoughts 0 0 0 - 0  PHQ-9 Score 7 2 2  - 12  Difficult doing work/chores Not difficult at all Not difficult at all Not difficult at all - -   GAD 7 : Generalized Anxiety Score 12/24/2019 07/11/2019  Nervous, Anxious, on Edge 1 1  Control/stop worrying 1 0  Worry too much - different things 1 0  Trouble relaxing 1 0  Restless 0 0  Easily annoyed or irritable 1 0  Afraid - awful might happen 0 0  Total GAD 7 Score 5 1  Anxiety Difficulty Not difficult at all -    VAGINAL DISCHARGE Treated on 11/25/19 for bacterial vaginosis.  This is recurrent issue and she has seen GYN for in past.  Reports some pruritus and discharge currently present.  She has Diflucan at home and wants to know if it is okay.   Duration: days Discharge description: cottage cheese  Pruritus: yes Dysuria: no Malodorous: no Urinary frequency: no Fevers: no Abdominal pain: no  Sexual activity: monogamous History of sexually transmitted diseases: no Recent antibiotic use: yes Context: recurrent yeast infections and recurrent BV  Treatments attempted: none  Relevant past medical, surgical, family and social history reviewed and updated as indicated. Interim medical history since our last visit reviewed. Allergies and medications reviewed and  updated.  Review of Systems  Constitutional: Negative for activity change, appetite change, diaphoresis, fatigue and fever.  Respiratory: Negative for cough, chest tightness and shortness of breath.   Cardiovascular: Negative for chest pain, palpitations and leg swelling.  Gastrointestinal: Negative.   Genitourinary: Positive for vaginal discharge. Negative for dysuria, frequency, hematuria and urgency.  Psychiatric/Behavioral: Positive for sleep disturbance. Negative for decreased concentration, self-injury and suicidal ideas. The patient is nervous/anxious.     Per HPI unless specifically indicated above     Objective:    There were no vitals taken for this visit.  Wt Readings from Last 3 Encounters:  11/25/19 150 lb 9.6 oz (68.3 kg)  11/13/19 140 lb (63.5 kg)  08/25/19 140 lb (63.5 kg)    Physical Exam Vitals and nursing note reviewed.  Constitutional:      General: She is awake. She is not in acute distress.    Appearance: She is well-developed. She is not ill-appearing.  HENT:     Head: Normocephalic.     Right Ear: Hearing normal.     Left Ear: Hearing normal.  Eyes:     General: Lids are normal.        Right eye: No discharge.        Left eye: No discharge.     Conjunctiva/sclera: Conjunctivae normal.  Pulmonary:     Effort: Pulmonary effort is normal. No accessory muscle usage or respiratory distress.  Musculoskeletal:     Cervical back: Normal range of motion.  Neurological:     Mental Status: She is alert and oriented to person, place, and time.  Psychiatric:        Attention and Perception: Attention normal.        Mood and Affect: Mood normal.        Behavior: Behavior normal. Behavior is cooperative.        Thought Content: Thought content normal.        Judgment: Judgment normal.     Results for orders placed or performed in visit on 11/25/19  WET PREP FOR TRICH, YEAST, CLUE   Specimen: Vaginal; Sterile Swab   Sterile Swab  Result Value Ref Range    Trichomonas Exam Negative Negative   Yeast Exam Negative Negative   Clue Cell Exam Positive (A) Negative  Microscopic Examination   Urine  Result Value Ref Range   WBC, UA 11-30 (A) 0 - 5 /hpf   RBC 3-10 (A) 0 - 2 /hpf   Epithelial Cells (non renal) 0-10 0 - 10 /hpf   Bacteria, UA Few (A) None seen/Few  Urinalysis, Routine w reflex microscopic  Result Value Ref Range   Specific Gravity, UA 1.020 1.005 - 1.030   pH, UA 5.5 5.0 - 7.5   Color, UA Yellow Yellow   Appearance Ur Hazy (A) Clear   Leukocytes,UA 2+ (A) Negative   Protein,UA Negative Negative/Trace   Glucose, UA Negative Negative  Ketones, UA Negative Negative   RBC, UA Trace (A) Negative   Bilirubin, UA Negative Negative   Urobilinogen, Ur 0.2 0.2 - 1.0 mg/dL   Nitrite, UA Negative Negative   Microscopic Examination See below:       Assessment & Plan:   Problem List Items Addressed This Visit      Musculoskeletal and Integument   Vaginal pruritus    Suspect yeast infection due to recent abx use, recommend patient take Diflucan tablet which she has at home and return to office if ongoing or worsening symptoms.        Other   Depression - Primary    Chronic, ongoing with recent increased anxiety.  Would like to try switching to different SSRI.  Will do direct switch from Celexa 40 MG to Sertraline 50 MG, script sent and educated patient on medication and change.   Continue current medications prescribed by neurology, continue this collaboration.  She denies SI/HI.  Return in 6 weeks.      Relevant Medications   sertraline (ZOLOFT) 50 MG tablet   Anxiety    Chronic, ongoing with recent increased anxiety reported.  Would like to try switching to different SSRI.  Will do direct switch from Celexa 40 MG to Sertraline 50 MG, script sent and educated patient on medication and change.  Denies SI/HI.  Continue Xanax as prescribed by neurology.  Return in 6 weeks.      Relevant Medications   sertraline (ZOLOFT) 50 MG  tablet      I discussed the assessment and treatment plan with the patient. The patient was provided an opportunity to ask questions and all were answered. The patient agreed with the plan and demonstrated an understanding of the instructions.   The patient was advised to call back or seek an in-person evaluation if the symptoms worsen or if the condition fails to improve as anticipated.   I provided 21+ minutes of time during this encounter.  Follow up plan: Return in about 6 weeks (around 02/04/2020) for Mood.

## 2019-12-24 NOTE — Patient Instructions (Signed)
Vaginal Yeast Infection, Adult  Vaginal yeast infection is a condition that causes vaginal discharge as well as soreness, swelling, and redness (inflammation) of the vagina. This is a common condition. Some women get this infection frequently. What are the causes? This condition is caused by a change in the normal balance of the yeast (candida) and bacteria that live in the vagina. This change causes an overgrowth of yeast, which causes the inflammation. What increases the risk? The condition is more likely to develop in women who:  Take antibiotic medicines.  Have diabetes.  Take birth control pills.  Are pregnant.  Douche often.  Have a weak body defense system (immune system).  Have been taking steroid medicines for a long time.  Frequently wear tight clothing. What are the signs or symptoms? Symptoms of this condition include:  White, thick, creamy vaginal discharge.  Swelling, itching, redness, and irritation of the vagina. The lips of the vagina (vulva) may be affected as well.  Pain or a burning feeling while urinating.  Pain during sex. How is this diagnosed? This condition is diagnosed based on:  Your medical history.  A physical exam.  A pelvic exam. Your health care provider will examine a sample of your vaginal discharge under a microscope. Your health care provider may send this sample for testing to confirm the diagnosis. How is this treated? This condition is treated with medicine. Medicines may be over-the-counter or prescription. You may be told to use one or more of the following:  Medicine that is taken by mouth (orally).  Medicine that is applied as a cream (topically).  Medicine that is inserted directly into the vagina (suppository). Follow these instructions at home:  Lifestyle  Do not have sex until your health care provider approves. Tell your sex partner that you have a yeast infection. That person should go to his or her health care  provider and ask if they should also be treated.  Do not wear tight clothes, such as pantyhose or tight pants.  Wear breathable cotton underwear. General instructions  Take or apply over-the-counter and prescription medicines only as told by your health care provider.  Eat more yogurt. This may help to keep your yeast infection from returning.  Do not use tampons until your health care provider approves.  Try taking a sitz bath to help with discomfort. This is a warm water bath that is taken while you are sitting down. The water should only come up to your hips and should cover your buttocks. Do this 3-4 times per day or as told by your health care provider.  Do not douche.  If you have diabetes, keep your blood sugar levels under control.  Keep all follow-up visits as told by your health care provider. This is important. Contact a health care provider if:  You have a fever.  Your symptoms go away and then return.  Your symptoms do not get better with treatment.  Your symptoms get worse.  You have new symptoms.  You develop blisters in or around your vagina.  You have blood coming from your vagina and it is not your menstrual period.  You develop pain in your abdomen. Summary  Vaginal yeast infection is a condition that causes discharge as well as soreness, swelling, and redness (inflammation) of the vagina.  This condition is treated with medicine. Medicines may be over-the-counter or prescription.  Take or apply over-the-counter and prescription medicines only as told by your health care provider.  Do not douche.   Do not have sex or use tampons until your health care provider approves.  Contact a health care provider if your symptoms do not get better with treatment or your symptoms go away and then return. This information is not intended to replace advice given to you by your health care provider. Make sure you discuss any questions you have with your health care  provider. Document Revised: 11/29/2018 Document Reviewed: 09/17/2017 Elsevier Patient Education  2020 [employer]vier Inc.  

## 2019-12-24 NOTE — Assessment & Plan Note (Signed)
Chronic, ongoing with recent increased anxiety.  Would like to try switching to different SSRI.  Will do direct switch from Celexa 40 MG to Sertraline 50 MG, script sent and educated patient on medication and change.   Continue current medications prescribed by neurology, continue this collaboration.  She denies SI/HI.  Return in 6 weeks.

## 2019-12-24 NOTE — Assessment & Plan Note (Signed)
Suspect yeast infection due to recent abx use, recommend patient take Diflucan tablet which she has at home and return to office if ongoing or worsening symptoms.

## 2020-01-01 ENCOUNTER — Ambulatory Visit: Payer: Medicaid Other | Admitting: Unknown Physician Specialty

## 2020-01-02 ENCOUNTER — Telehealth (INDEPENDENT_AMBULATORY_CARE_PROVIDER_SITE_OTHER): Payer: Medicaid Other | Admitting: Nurse Practitioner

## 2020-01-02 ENCOUNTER — Encounter: Payer: Self-pay | Admitting: Nurse Practitioner

## 2020-01-02 DIAGNOSIS — N898 Other specified noninflammatory disorders of vagina: Secondary | ICD-10-CM | POA: Diagnosis not present

## 2020-01-02 NOTE — Assessment & Plan Note (Signed)
Due to virtual and no transportation today, unable to obtain wet prep.  Suspect recurrence of BV.  Recommend she use Flagyl vaginal cream at home and Diflucan that she also has at home. Restart probiotic daily, which did decrease frequency of infections.  If ongoing or worsening symptoms return to office.

## 2020-01-02 NOTE — Patient Instructions (Signed)

## 2020-01-02 NOTE — Progress Notes (Signed)
There were no vitals taken for this visit.   Subjective:    Patient ID: Jennifer Watts, female    DOB: [birth year], 24 y.o.   MRN: [id number]  HPI: Jennifer Watts is a 24 y.o. female  Chief Complaint  Patient presents with  . Vaginal Discharge    pt states she has been having discharge. States she had a difulcan at home that she took and felt better but then symptoms came back     . This visit was completed via MyChart due to the restrictions of the COVID-19 pandemic. All issues as above were discussed and addressed. Physical exam was done as above through visual confirmation on MyChart. If it was felt that the patient should be evaluated in the office, they were directed there. The patient verbally consented to this visit. . Location of the patient: home . Location of the provider: work . Those involved with this call:  . Provider: Aura Dials, DNP . CMA: Wilhemena Durie, CMA . Front Desk/Registration: Adela Ports  . Time spent on call: 20 minutes with patient face to face via video conference. More than 50% of this time was spent in counseling and coordination of care. 15 minutes total spent in review of patient's record and preparation of their chart.  . I verified patient identity using two factors (patient name and date of birth). Patient consents verbally to being seen via telemedicine visit today.    VAGINAL DISCHARGE She has history of frequent BV infections, recently treated with Flagyl 11/25/19. Has been seen by GYN in past, Dr. Dalbert Garnet last 06/25/19.  Took Diflucan at home, symptoms improved, but then came back.  Symptoms started 12/26/19 -- having abdominal cramping similar to BV.  Had missed doses probiotic and thinks this set things off. Duration: days Discharge description: clear  Pruritus: yes Dysuria: yes Malodorous: yes Urinary frequency: no Fevers: no Abdominal pain: yes  Sexual activity: monogamous History of sexually transmitted diseases:  no Recent antibiotic use: yes Context: recurrent yeast infections and recurrent BV  Treatments attempted: Diflucan  Relevant past medical, surgical, family and social history reviewed and updated as indicated. Interim medical history since our last visit reviewed. Allergies and medications reviewed and updated.  Review of Systems  Constitutional: Negative for activity change, appetite change, diaphoresis, fatigue and fever.  Respiratory: Negative for cough, chest tightness and shortness of breath.   Cardiovascular: Negative for chest pain, palpitations and leg swelling.  Gastrointestinal: Positive for abdominal pain. Negative for abdominal distention, constipation, diarrhea, nausea and vomiting.  Genitourinary: Positive for vaginal discharge. Negative for dysuria, frequency, hematuria and urgency.    Per HPI unless specifically indicated above     Objective:    There were no vitals taken for this visit.  Wt Readings from Last 3 Encounters:  11/25/19 150 lb 9.6 oz (68.3 kg)  11/13/19 140 lb (63.5 kg)  08/25/19 140 lb (63.5 kg)    Physical Exam Vitals and nursing note reviewed.  Constitutional:      General: She is awake. She is not in acute distress.    Appearance: She is well-developed. She is not ill-appearing.  HENT:     Head: Normocephalic.     Right Ear: Hearing normal.     Left Ear: Hearing normal.  Eyes:     General: Lids are normal.        Right eye: No discharge.        Left eye: No discharge.     Conjunctiva/sclera: Conjunctivae normal.  Pulmonary:     Effort: Pulmonary effort is normal. No accessory muscle usage or respiratory distress.  Musculoskeletal:     Cervical back: Normal range of motion.  Neurological:     Mental Status: She is alert and oriented to person, place, and time.  Psychiatric:        Attention and Perception: Attention normal.        Mood and Affect: Mood normal.        Behavior: Behavior normal. Behavior is cooperative.        Thought  Content: Thought content normal.        Judgment: Judgment normal.     Results for orders placed or performed in visit on 11/25/19  WET PREP FOR TRICH, YEAST, CLUE   Specimen: Vaginal; Sterile Swab   Sterile Swab  Result Value Ref Range   Trichomonas Exam Negative Negative   Yeast Exam Negative Negative   Clue Cell Exam Positive (A) Negative  Microscopic Examination   Urine  Result Value Ref Range   WBC, UA 11-30 (A) 0 - 5 /hpf   RBC 3-10 (A) 0 - 2 /hpf   Epithelial Cells (non renal) 0-10 0 - 10 /hpf   Bacteria, UA Few (A) None seen/Few  Urinalysis, Routine w reflex microscopic  Result Value Ref Range   Specific Gravity, UA 1.020 1.005 - 1.030   pH, UA 5.5 5.0 - 7.5   Color, UA Yellow Yellow   Appearance Ur Hazy (A) Clear   Leukocytes,UA 2+ (A) Negative   Protein,UA Negative Negative/Trace   Glucose, UA Negative Negative   Ketones, UA Negative Negative   RBC, UA Trace (A) Negative   Bilirubin, UA Negative Negative   Urobilinogen, Ur 0.2 0.2 - 1.0 mg/dL   Nitrite, UA Negative Negative   Microscopic Examination See below:       Assessment & Plan:   Problem List Items Addressed This Visit      Other   Vaginal discharge - Primary    Due to virtual and no transportation today, unable to obtain wet prep.  Suspect recurrence of BV.  Recommend she use Flagyl vaginal cream at home and Diflucan that she also has at home. Restart probiotic daily, which did decrease frequency of infections.  If ongoing or worsening symptoms return to office.         I discussed the assessment and treatment plan with the patient. The patient was provided an opportunity to ask questions and all were answered. The patient agreed with the plan and demonstrated an understanding of the instructions.   The patient was advised to call back or seek an in-person evaluation if the symptoms worsen or if the condition fails to improve as anticipated.   I provided 21+ minutes of time during this  encounter.  Follow up plan: Return if symptoms worsen or fail to improve.

## 2020-01-08 ENCOUNTER — Ambulatory Visit
Admission: EM | Admit: 2020-01-08 | Discharge: 2020-01-08 | Disposition: A | Discharge status: Home / Self Care | Payer: Medicaid Other | Attending: Family Medicine | Admitting: Family Medicine

## 2020-01-08 DIAGNOSIS — Z20822 Contact with and (suspected) exposure to covid-19: Secondary | ICD-10-CM | POA: Diagnosis not present

## 2020-01-08 LAB — SARS CORONAVIRUS 2 (TAT 6-24 HRS): SARS Coronavirus 2: NEGATIVE

## 2020-01-08 NOTE — Discharge Instructions (Signed)

## 2020-01-08 NOTE — ED Triage Notes (Signed)
Patient here for COVID testing, no symptoms 

## 2020-01-09 ENCOUNTER — Ambulatory Visit: Payer: Medicaid Other | Admitting: Nurse Practitioner

## 2020-01-16 ENCOUNTER — Other Ambulatory Visit: Payer: Self-pay | Admitting: Nurse Practitioner

## 2020-01-16 NOTE — Telephone Encounter (Signed)
Routing to provider  

## 2020-01-16 NOTE — Telephone Encounter (Signed)
Pharmacy request changes to original Rx- 90 days supply- alert sent - no follow up scheduled.

## 2020-02-02 ENCOUNTER — Ambulatory Visit: Payer: Self-pay | Admitting: *Deleted

## 2020-02-02 NOTE — Telephone Encounter (Signed)
Patient called with chest pain after having 1st Pfizer vaccine on 01/26/20; her chest pain started having chest pain  And tightness 2 days ago; the patient states she has been taking her pepcid with no relief in symptoms; she describes her discomfort at 4 out of 10 and gets worse with exertion; she says her pain goes "up"; recommendations made per nurse triage protocol; she verbalized understanding; the patient sees Aura Dials, [facility]; will route to office for notification.  Reason for Disposition  [1] Chest pain (or "angina") comes and goes AND [2] is happening more often (increasing in frequency) or getting worse (increasing in severity) (Exception: chest pains that last only a few seconds)  Answer Assessment - Initial Assessment Questions 1. LOCATION: "Where does it hurt?"       Middle of chest between her breast 2. RADIATION: "Does the pain go anywhere else?" (e.g., into neck, jaw, arms, back)    To arms and collar bone 3. ONSET: "When did the chest pain begin?" (Minutes, hours or days)     01/31/20 4. PATTERN "Does the pain come and go, or has it been constant since it started?"  "Does it get worse with exertion?"     Intermittent, worse with exertion 5. DURATION: "How long does it last" (e.g., seconds, minutes, hours)     Minutes to hours 6. SEVERITY: "How bad is the pain?"  (e.g., Scale 1-10; mild, moderate, or severe)    - MILD (1-3): doesn't interfere with normal activities     - MODERATE (4-7): interferes with normal activities or awakens from sleep    - SEVERE (8-10): excruciating pain, unable to do any normal activities     4 out of 10 7. CARDIAC RISK FACTORS: "Do you have any history of heart problems or risk factors for heart disease?" (e.g., angina, prior heart attack; diabetes, high blood pressure, high cholesterol, smoker, or strong family history of heart disease)     Smoker; quit 01/29/20 8. PULMONARY RISK FACTORS: "Do you have any history of lung  disease?"  (e.g., blood clots in lung, asthma, emphysema, birth control pills)     smoker 9. CAUSE: "What do you think is causing the chest pain?"     Not sure 10. OTHER SYMPTOMS: "Do you have any other symptoms?" (e.g., dizziness, nausea, vomiting, sweating, fever, difficulty breathing, cough)      Indigestion has gotten worse this past week (chest burning) 11. PREGNANCY: "Is there any chance you are pregnant?" "When was your last menstrual period?"       No patient takes Depo; no menstrual period  Protocols used: CHEST PAIN-A-AH

## 2020-02-02 NOTE — Telephone Encounter (Signed)
Agree with disposition. 

## 2020-02-10 ENCOUNTER — Ambulatory Visit: Payer: BLUE CROSS/BLUE SHIELD

## 2020-02-27 NOTE — Progress Notes (Signed)
The following Assist/Replace Program for Rituxan from [employer] has been terminated due to assistance terminated 07/14/2019. Last DOS:11/23/2017

## 2020-03-08 ENCOUNTER — Other Ambulatory Visit: Payer: Self-pay | Admitting: Nurse Practitioner

## 2020-03-08 NOTE — Telephone Encounter (Signed)
Requested medication (s) are due for refill today: yes  Requested medication (s) are on the active medication list: yes  Last refill:  01/24/19  Future visit scheduled: yes  Notes to clinic:  med not assigned to a protocol   Requested Prescriptions  Pending Prescriptions Disp Refills   medroxyPROGESTERone (DEPO-PROVERA) 150 MG/ML injection [Pharmacy Med Name: MEDROXYPROGESTERONE 150 MG/ML] 1 mL 4    Sig: INJECT 1 ML (150 MG TOTAL) INTO THE MUSCLE EVERY 3 (THREE) MONTHS.      Off-Protocol Failed - 03/08/2020  9:03 AM      Failed - Medication not assigned to a protocol, review manually.      Passed - Valid encounter within last 12 months    Recent Outpatient Visits           2 months ago Vaginal discharge   [facility] [place], Jolene T, NP   2 months ago Mild episode of recurrent major depressive disorder (HCC)   [facility] [place], [place] T, NP   3 months ago Bacterial vaginosis   [facility] [place], [place] T, NP   8 months ago Mild episode of recurrent major depressive disorder (HCC)   [facility] [place], Dorie Rank, NP   1 year ago Chest pressure   [facility] [place], Dorie Rank, NP       Future Appointments             Tomorrow Marjie Skiff, NP [facility], [facility]

## 2020-03-09 ENCOUNTER — Telehealth: Payer: Medicaid Other | Admitting: Nurse Practitioner

## 2020-03-10 ENCOUNTER — Encounter: Payer: Self-pay | Admitting: Nurse Practitioner

## 2020-03-10 ENCOUNTER — Telehealth (INDEPENDENT_AMBULATORY_CARE_PROVIDER_SITE_OTHER): Payer: Medicaid Other | Admitting: Nurse Practitioner

## 2020-03-10 DIAGNOSIS — F330 Major depressive disorder, recurrent, mild: Secondary | ICD-10-CM | POA: Diagnosis not present

## 2020-03-10 DIAGNOSIS — F419 Anxiety disorder, unspecified: Secondary | ICD-10-CM

## 2020-03-10 MED ORDER — SERTRALINE HCL 100 MG PO TABS: 100.0000 mg | ORAL_TABLET | Freq: Every day | ORAL | 3 refills | Status: DC

## 2020-03-10 NOTE — Assessment & Plan Note (Signed)
Chronic, ongoing with recent increased anxiety reported.  Increase Sertraline to 100 MG.  Denies SI/HI.  Continue Xanax as prescribed by neurology.  Return in 4 weeks.

## 2020-03-10 NOTE — Progress Notes (Signed)
There were no vitals taken for this visit.   Subjective:    Patient ID: Jennifer Watts, female    DOB: 01-16-1996, 24 y.o.   MRN: [id number]  HPI: Jennifer Watts is a 24 y.o. female  Chief Complaint  Patient presents with  . Anxiety    . This visit was completed via MyChart due to the restrictions of the COVID-19 pandemic. All issues as above were discussed and addressed. Physical exam was done as above through visual confirmation on MyChart. If it was felt that the patient should be evaluated in the office, they were directed there. The patient verbally consented to this visit. . Location of the patient: home . Location of the provider: work . Those involved with this call:  . Provider: Aura Dials, DNP . CMA: Wilhemena Durie, CMA . Front Desk/Registration: Adela Ports  . Time spent on call: 20 minutes with patient face to face via video conference. More than 50% of this time was spent in counseling and coordination of care. 15 minutes total spent in review of patient's record and preparation of their chart.  . I verified patient identity using two factors (patient name and date of birth). Patient consents verbally to being seen via telemedicine visit today.    DEPRESSION Currently taking Sertraline 50 MG daily (previously took Celexa) + Trazodone 100 MG at night (reports at night after taking medications on occasion gets dizzy -- last night took 1/2 and this helped). Takes Xanax as ordered by neurology (takes this 3 times a day).  Does report occasional increased anxiety recently with weather changes, but past two weeks has felt fine -- notices this is a monthly thing, but has no menstrual cycles due to Depo. Has taken Prozac in past with no benefit. Mood status: stable Satisfied with current treatment?: yes Symptom severity: mild  Duration of current treatment : chronic Side effects: no Medication compliance: good compliance Psychotherapy/counseling: in  past Depressed mood: yes Anxious mood: yes Anhedonia: no Significant weight loss or gain: no Insomnia: yes hard to stay asleep Fatigue: occasional Feelings of worthlessness or guilt: no Impaired concentration/indecisiveness: no Suicidal ideations: no Hopelessness: no Crying spells: yes -- once and awhile Depression screen [facility] 2/9 03/10/2020 12/24/2019 07/11/2019 01/03/2019 07/23/2018  Decreased Interest 1 1 0 0 0  Down, Depressed, Hopeless 1 1 0 0 0  PHQ - 2 Score 2 2 0 0 0  Altered sleeping 0 1 0 1 -  Tired, decreased energy 1 2 0 0 -  Change in appetite 0 0 2 0 -  Feeling bad or failure about yourself  0 1 0 0 -  Trouble concentrating 0 1 0 1 -  Moving slowly or fidgety/restless 1 0 0 0 -  Suicidal thoughts 0 0 0 0 -  PHQ-9 Score 4 7 2 2  -  Difficult doing work/chores Somewhat difficult Not difficult at all Not difficult at all Not difficult at all -   GAD 7 : Generalized Anxiety Score 03/10/2020 12/24/2019 07/11/2019  Nervous, Anxious, on Edge 1 1 1   Control/stop worrying 0 1 0  Worry too much - different things 0 1 0  Trouble relaxing 1 1 0  Restless 0 0 0  Easily annoyed or irritable 0 1 0  Afraid - awful might happen 0 0 0  Total GAD 7 Score 2 5 1   Anxiety Difficulty Not difficult at all Not difficult at all -   Relevant past medical, surgical, family and social history reviewed and  updated as indicated. Interim medical history since our last visit reviewed. Allergies and medications reviewed and updated.  Review of Systems  Constitutional: Negative for activity change, appetite change, diaphoresis, fatigue and fever.  Respiratory: Negative for cough, chest tightness and shortness of breath.   Cardiovascular: Negative for chest pain, palpitations and leg swelling.  Gastrointestinal: Negative.   Psychiatric/Behavioral: Positive for sleep disturbance. Negative for decreased concentration, self-injury and suicidal ideas. The patient is nervous/anxious.     Per HPI unless  specifically indicated above     Objective:    There were no vitals taken for this visit.  Wt Readings from Last 3 Encounters:  11/25/19 150 lb 9.6 oz (68.3 kg)  11/13/19 140 lb (63.5 kg)  08/25/19 140 lb (63.5 kg)    Physical Exam Vitals and nursing note reviewed.  Constitutional:      General: She is awake. She is not in acute distress.    Appearance: She is well-developed. She is not ill-appearing.  HENT:     Head: Normocephalic.     Right Ear: Hearing normal.     Left Ear: Hearing normal.  Eyes:     General: Lids are normal.        Right eye: No discharge.        Left eye: No discharge.     Conjunctiva/sclera: Conjunctivae normal.  Pulmonary:     Effort: Pulmonary effort is normal. No accessory muscle usage or respiratory distress.  Musculoskeletal:     Cervical back: Normal range of motion.  Neurological:     Mental Status: She is alert and oriented to person, place, and time.  Psychiatric:        Attention and Perception: Attention normal.        Mood and Affect: Mood normal.        Behavior: Behavior normal. Behavior is cooperative.        Thought Content: Thought content normal.        Judgment: Judgment normal.     Results for orders placed or performed during the hospital encounter of 01/08/20  SARS CORONAVIRUS 2 (TAT 6-24 HRS) Nasopharyngeal Nasopharyngeal Swab   Specimen: Nasopharyngeal Swab  Result Value Ref Range   SARS Coronavirus 2 NEGATIVE NEGATIVE      Assessment & Plan:   Problem List Items Addressed This Visit      Other   Depression - Primary    Chronic, ongoing with recent increased anxiety.  Will increase Sertraline to 100 MG daily and continue Xanax as ordered by neurology.   Continue current medications prescribed by neurology, continue this collaboration.  She denies SI/HI.  Return in 4 weeks.      Relevant Medications   sertraline (ZOLOFT) 100 MG tablet   Anxiety    Chronic, ongoing with recent increased anxiety reported.  Increase  Sertraline to 100 MG.  Denies SI/HI.  Continue Xanax as prescribed by neurology.  Return in 4 weeks.      Relevant Medications   sertraline (ZOLOFT) 100 MG tablet     I discussed the assessment and treatment plan with the patient. The patient was provided an opportunity to ask questions and all were answered. The patient agreed with the plan and demonstrated an understanding of the instructions.   The patient was advised to call back or seek an in-person evaluation if the symptoms worsen or if the condition fails to improve as anticipated.   I provided 21+ minutes of time during this encounter.  Follow up plan:  Return in about 4 weeks (around 04/07/2020) for Mood.

## 2020-03-10 NOTE — Assessment & Plan Note (Signed)
Chronic, ongoing with recent increased anxiety.  Will increase Sertraline to 100 MG daily and continue Xanax as ordered by neurology.   Continue current medications prescribed by neurology, continue this collaboration.  She denies SI/HI.  Return in 4 weeks.

## 2020-03-10 NOTE — Patient Instructions (Signed)

## 2020-03-11 ENCOUNTER — Telehealth: Payer: Self-pay

## 2020-03-11 NOTE — Telephone Encounter (Signed)
-----   Message from Marjie Skiff, NP sent at 03/10/2020  3:47 PM EDT ----- 4 week follow-up mood

## 2020-03-12 NOTE — Telephone Encounter (Signed)
LVM TO MAKE THIS APT  

## 2020-04-06 ENCOUNTER — Other Ambulatory Visit: Payer: Self-pay | Admitting: Nurse Practitioner

## 2020-04-06 NOTE — Telephone Encounter (Signed)
   Notes to clinic: requesting a 90 day with refills Review for change    Requested Prescriptions  Pending Prescriptions Disp Refills   sertraline (ZOLOFT) 100 MG tablet [Pharmacy Med Name: SERTRALINE HCL 100 MG TABLET] 90 tablet 2    Sig: TAKE 1 TABLET BY MOUTH EVERY DAY      Psychiatry:  Antidepressants - SSRI Passed - 04/06/2020 12:33 PM      Passed - Completed PHQ-2 or PHQ-9 in the last 360 days      Passed - Valid encounter within last 6 months    Recent Outpatient Visits           3 weeks ago Mild episode of recurrent major depressive disorder (HCC)   [facility] [place], Dorie Rank, NP   3 months ago Vaginal discharge   [facility] [place], [place] T, NP   3 months ago Mild episode of recurrent major depressive disorder (HCC)   [facility] [place], [place] T, NP   4 months ago Bacterial vaginosis   [facility] [place], Jolene T, NP   9 months ago Mild episode of recurrent major depressive disorder (HCC)   [facility] [place], Dorie Rank, NP

## 2020-04-26 ENCOUNTER — Ambulatory Visit: Payer: Self-pay

## 2020-04-26 ENCOUNTER — Emergency Department: Payer: Medicaid Other

## 2020-04-26 ENCOUNTER — Other Ambulatory Visit: Payer: Self-pay

## 2020-04-26 DIAGNOSIS — F419 Anxiety disorder, unspecified: Secondary | ICD-10-CM | POA: Diagnosis not present

## 2020-04-26 DIAGNOSIS — R110 Nausea: Secondary | ICD-10-CM | POA: Diagnosis not present

## 2020-04-26 DIAGNOSIS — F410 Panic disorder [episodic paroxysmal anxiety] without agoraphobia: Secondary | ICD-10-CM | POA: Insufficient documentation

## 2020-04-26 DIAGNOSIS — G43909 Migraine, unspecified, not intractable, without status migrainosus: Secondary | ICD-10-CM | POA: Insufficient documentation

## 2020-04-26 DIAGNOSIS — R079 Chest pain, unspecified: Secondary | ICD-10-CM | POA: Diagnosis not present

## 2020-04-26 DIAGNOSIS — Z87891 Personal history of nicotine dependence: Secondary | ICD-10-CM | POA: Insufficient documentation

## 2020-04-26 DIAGNOSIS — R072 Precordial pain: Secondary | ICD-10-CM | POA: Diagnosis not present

## 2020-04-26 LAB — COMPREHENSIVE METABOLIC PANEL
ALT: 14 U/L (ref 0–44)
AST: 16 U/L (ref 15–41)
Albumin: 4.3 g/dL (ref 3.5–5.0)
Alkaline Phosphatase: 56 U/L (ref 38–126)
Anion gap: 12 (ref 5–15)
BUN: 14 mg/dL (ref 6–20)
CO2: 21 mmol/L — ABNORMAL LOW (ref 22–32)
Calcium: 9.5 mg/dL (ref 8.9–10.3)
Chloride: 106 mmol/L (ref 98–111)
Creatinine, Ser: 0.8 mg/dL (ref 0.44–1.00)
GFR, Estimated: >60 mL/min (ref >60)
Glucose, Bld: 97 mg/dL (ref 70–99)
Potassium: 3.6 mmol/L (ref 3.5–5.1)
Sodium: 139 mmol/L (ref 135–145)
Total Bilirubin: 0.9 mg/dL (ref 0.3–1.2)
Total Protein: 7 g/dL (ref 6.5–8.1)

## 2020-04-26 LAB — TROPONIN I (HIGH SENSITIVITY): Troponin I (High Sensitivity): <2 ng/L (ref <18)

## 2020-04-26 LAB — CBC
HCT: 42 % (ref 36.0–46.0)
Hemoglobin: 14.4 g/dL (ref 12.0–15.0)
MCH: 31.6 pg (ref 26.0–34.0)
MCHC: 34.3 g/dL (ref 30.0–36.0)
MCV: 92.1 fL (ref 80.0–100.0)
Platelets: 332 10*3/uL (ref 150–400)
RBC: 4.56 MIL/uL (ref 3.87–5.11)
RDW: 11.8 % (ref 11.5–15.5)
WBC: 10.6 10*3/uL — ABNORMAL HIGH (ref 4.0–10.5)
nRBC: 0 % (ref 0.0–0.2)

## 2020-04-26 NOTE — Telephone Encounter (Signed)
Please see if she is okay seeing Dr. Linwood Dibbles tomorrow at 1:40.  If so please schedule.  If worsening symptoms though I do advise ER or UC visit this evening.  She is aware of need for appointment for this to be evaluated.

## 2020-04-26 NOTE — Telephone Encounter (Signed)
Noted  

## 2020-04-26 NOTE — Telephone Encounter (Signed)
Chest pain- started 2 weeks worsening. Last night went away. Vomited x 1 . Hot bath and resting helped. Took Xanax and took something for indigestion which offered slight relief.   Pain located mid chest and "all over chest." Pt c/o back pain that " lasted a minute." Pt stated the pain comes and goes but yesterday it was the worse it has been.  Pt c/o pain 6/10 tightness and throbbing. No breathing difficulties. Pt is on Depo and currently vapes.  Advised pt to go get evaluated to ED but refused. Called office and spoke with Maximino Sarin and was asked to send note to office for advice.  Reason for Disposition . [1] Chest pain (or "angina") comes and goes AND [2] is happening more often (increasing in frequency) or getting worse (increasing in severity) (Exception: chest pains that last only a few seconds)  Additional Information . Commented on: Pain also in shoulder(s) or arm(s) or jaw (Exception: pain is clearly made worse by movement)    Goes to the back  Answer Assessment - Initial Assessment Questions 1. LOCATION: "Where does it hurt?"       Mid chest and all over chest are 2. RADIATION: "Does the pain go anywhere else?" (e.g., into neck, jaw, arms, back)     Last night went through to back last ed 1 minute 3. ONSET: "When did the chest pain begin?" (Minutes, hours or days)      1-2 weeks ago 4. PATTERN "Does the pain come and go, or has it been constant since it started?"  "Does it get worse with exertion?"      Comes and goes-occasionnaly gets worse with exertion 5. DURATION: "How long does it last" (e.g., seconds, minutes, hours)    Comes and goes but yesterday and last night was constant 6. SEVERITY: "How bad is the pain?"  (e.g., Scale 1-10; mild, moderate, or severe)    - MILD (1-3): doesn't interfere with normal activities     - MODERATE (4-7): interferes with normal activities or awakens from sleep    - SEVERE (8-10): excruciating pain, unable to do any normal activities       6/10  moderate- tightness and throbbing 7. CARDIAC RISK FACTORS: "Do you have any history of heart problems or risk factors for heart disease?" (e.g., angina, prior heart attack; diabetes, high blood pressure, high cholesterol, smoker, or strong family history of heart disease)     Currently vaping 8. PULMONARY RISK FACTORS: "Do you have any history of lung disease?"  (e.g., blood clots in lung, asthma, emphysema, birth control pills)   On Depo shot 9. CAUSE: "What do you think is causing the chest pain?"     unknown 10. OTHER SYMPTOMS: "Do you have any other symptoms?" (e.g., dizziness, nausea, vomiting, sweating, fever, difficulty breathing, cough)     Feels shaky and weak 11. PREGNANCY: "Is there any chance you are pregnant?" "When was your last menstrual period?"       No LMP: on Depo  Protocols used: CHEST PAIN-A-AH

## 2020-04-26 NOTE — Telephone Encounter (Signed)
Scheduled pt with Dr Linwood Dibbles tomorrow per Laurelyn Sickle saying it was ok

## 2020-04-26 NOTE — ED Triage Notes (Signed)
Pt in with co mid sternal chest pan states started 1 weeks ago. States symptoms worsened last night. Hx of reflux but states does not feel the same. States does feel shaky and weak.

## 2020-04-27 ENCOUNTER — Ambulatory Visit: Payer: Medicaid Other | Admitting: Family Medicine

## 2020-04-27 ENCOUNTER — Emergency Department
Admission: EM | Admit: 2020-04-27 | Discharge: 2020-04-27 | Disposition: A | Discharge status: Home / Self Care | Payer: Medicaid Other | Attending: Emergency Medicine | Admitting: Emergency Medicine

## 2020-04-27 ENCOUNTER — Encounter: Payer: Self-pay | Admitting: Emergency Medicine

## 2020-04-27 DIAGNOSIS — F410 Panic disorder [episodic paroxysmal anxiety] without agoraphobia: Secondary | ICD-10-CM

## 2020-04-27 DIAGNOSIS — G43909 Migraine, unspecified, not intractable, without status migrainosus: Secondary | ICD-10-CM

## 2020-04-27 DIAGNOSIS — F419 Anxiety disorder, unspecified: Secondary | ICD-10-CM

## 2020-04-27 HISTORY — DX: Migraine, unspecified, not intractable, without status migrainosus: G43.909

## 2020-04-27 LAB — TROPONIN I (HIGH SENSITIVITY): Troponin I (High Sensitivity): <2 ng/L (ref <18)

## 2020-04-27 MED ORDER — SODIUM CHLORIDE 0.9 % IV BOLUS
500.0000 mL | Freq: Once | INTRAVENOUS | Status: AC
  Administered 2020-04-27: 500 mL via INTRAVENOUS

## 2020-04-27 MED ORDER — DEXAMETHASONE SODIUM PHOSPHATE 10 MG/ML IJ SOLN
10.0000 mg | Freq: Once | INTRAMUSCULAR | Status: AC
  Administered 2020-04-27: 10 mg via INTRAVENOUS
  Filled 2020-04-27: qty 1

## 2020-04-27 MED ORDER — DIPHENHYDRAMINE HCL 50 MG/ML IJ SOLN
12.5000 mg | INTRAMUSCULAR | Status: AC
  Administered 2020-04-27: 12.5 mg via INTRAVENOUS
  Filled 2020-04-27: qty 1

## 2020-04-27 MED ORDER — DROPERIDOL 2.5 MG/ML IJ SOLN
2.5000 mg | Freq: Once | INTRAMUSCULAR | Status: AC
  Administered 2020-04-27: 2.5 mg via INTRAVENOUS
  Filled 2020-04-27: qty 2

## 2020-04-27 NOTE — ED Notes (Signed)
Pt asleep and resting comfortably at this time.  

## 2020-04-27 NOTE — ED Provider Notes (Signed)
[facility] Emergency Department Provider Note  ____________________________________________   Event Date/Time   First MD Initiated Contact with Patient 04/27/20 0403     (approximate)  I have reviewed the triage vital signs and the nursing notes.   HISTORY  Chief Complaint Chest Pain    HPI Jennifer Watts is a 24 y.o. female with  medical history as listed below who presents for evaluation of about a week of substernal chest pain that is sharp and occasionally burning.  She also said that she has felt "weird" and like something is not right for an extended period of time.  She has a history of anxiety and panic attacks and thinks that that is causing some of her issues.  She had a long wait (more than 7 hours) in the waiting room and said that during that time she started developing one of her typical migraines which is global and hurting all over and associated with some nausea.  She also started breathing rapidly getting worse scared which led to some tingling in both of her hands.  Nothing particular makes it better or worse.  She denies weakness in her arms or her legs, and although she is having some shortness of breath currently, that has not been an issue recently.  She denies vomiting.  She is not having any trouble with walking or word finding.  Nothing particular makes his symptoms better or worse and they are relatively acute in onset and severe.        Past Medical History:  Diagnosis Date  . Anxiety   . Depression   . Migraines   . MS (multiple sclerosis) (HCC)   . Neuromuscular disorder [facility])     Patient Active Problem List   Diagnosis Date Noted  . Vaginal discharge 01/02/2020  . Dermatitis 07/11/2019  . Heart burn 01/27/2019  . Migraine without aura and without status migrainosus, not intractable 03/14/2017  . Nicotine dependence, cigarettes, uncomplicated 07/08/2015  . Cannabis use disorder, moderate, dependence (HCC) 07/08/2015   . Dysmenorrhea in adolescent 06/02/2015  . Multiple sclerosis (HCC) 10/26/2014  . Depression 02/27/2014  . Anxiety 01/20/2014  . Vitamin D deficiency 12/29/2013    Past Surgical History:  Procedure Laterality Date  . TYMPANOSTOMY TUBE PLACEMENT      Prior to Admission medications   Medication Sig Start Date End Date Taking? Authorizing Provider  ALPRAZolam (XANAX) 0.25 MG tablet TAKE 1 TABLET BY MOUTH THREE TIMES A DAY AS NEEDED 07/08/18   [provider]  cephALEXin (KEFLEX) 500 MG capsule Take 500 mg by mouth 3 (three) times daily. 03/08/20   [provider]  cholecalciferol (VITAMIN D3) 25 MCG (1000 UT) tablet Take 1,000 Units by mouth daily.    [provider]  clobetasol cream (TEMOVATE) 0.05 % Apply 1 application topically 2 (two) times daily. Patient not taking: Reported on 11/25/2019 07/15/19   Aura Dials T, NP  Cyanocobalamin (B-12 PO) Take 1 tablet by mouth daily.    [provider]  famotidine (PEPCID) 20 MG tablet Take 1 tablet (20 mg total) by mouth 2 (two) times daily. 10/03/19   Cannady, Corrie Dandy T, NP  medroxyPROGESTERone (DEPO-PROVERA) 150 MG/ML injection INJECT 1 ML (150 MG TOTAL) INTO THE MUSCLE EVERY 3 (THREE) MONTHS. 03/08/20   Cannady, Corrie Dandy T, NP  predniSONE (STERAPRED UNI-PAK 21 TAB) 10 MG (21) TBPK tablet Take by mouth. Patient not taking: Reported on 03/10/2020 03/08/20   [provider]  sertraline (ZOLOFT) 100 MG  tablet TAKE 1 TABLET BY MOUTH EVERY DAY 04/06/20   Cannady, Corrie Dandy T, NP  SUMAtriptan (IMITREX) 100 MG tablet Take 50-100 mg by mouth daily as needed. 01/08/17   [provider]  traZODone (DESYREL) 100 MG tablet TAKE 1 TABLET BY MOUTH EVERY DAY AT NIGHT 01/28/19   [provider]  sertraline (ZOLOFT) 100 MG tablet Take 1 tablet (100 mg total) by mouth daily. 03/10/20   Aura Dials T, NP    Allergies Reglan [metoclopramide], Ibuprofen, Penicillins, Sulfa antibiotics, and  Sulfasalazine  Family History  Problem Relation Age of Onset  . Hypertension Maternal Grandmother   . Cancer Paternal Grandfather        throat  . Multiple sclerosis Paternal Grandfather   . Anxiety disorder Mother     Social History Social History   Tobacco Use  . Smoking status: Former Smoker    Packs/day: 0.25    Types: Cigarettes    Quit date: 01/09/2020    Years since quitting: 0.2  . Smokeless tobacco: Never Used  Vaping Use  . Vaping Use: Former  Substance Use Topics  . Alcohol use: Yes    Comment: social  . Drug use: No    Review of Systems Constitutional: No fever/chills Eyes: No visual changes. ENT: No sore throat. Cardiovascular: Substernal chest pain. Respiratory: Some shortness of breath at this time. Gastrointestinal: No abdominal pain.  No nausea, no vomiting.  No diarrhea.  No constipation. Genitourinary: Negative for dysuria. Musculoskeletal: Negative for neck pain.  Negative for back pain. Integumentary: Negative for rash. Neurological: Some tingling in her hands while breathing rapidly.  No focal weakness.   ____________________________________________   PHYSICAL EXAM:  VITAL SIGNS: ED Triage Vitals  Enc Vitals Group     BP 04/26/20 2114 (!) 139/98     Pulse Rate 04/26/20 2114 85     Resp 04/26/20 2114 16     Temp 04/26/20 2114 99.2 F (37.3 C)     Temp Source 04/26/20 2114 Oral     SpO2 04/26/20 2114 98 %     Weight 04/26/20 2105 68 kg (150 lb)     Height 04/26/20 2105 1.651 m (5\' 5" )     Head Circumference --      Peak Flow --      Pain Score 04/26/20 2105 3     Pain Loc --      Pain Edu? --      Excl. in GC? --     Constitutional: Alert and oriented.  Eyes: Conjunctivae are normal.  Head: Atraumatic. Nose: Crying with clear rhinnorhea Mouth/Throat: Patient is wearing a mask. Neck: No stridor.  No meningeal signs.   Cardiovascular: Normal rate, regular rhythm. Good peripheral circulation. Respiratory: Increased respiratory  rate.  Normal respiratory effort.  No retractions. Gastrointestinal: Soft and nontender. No distention.  Musculoskeletal: No lower extremity tenderness nor edema. No gross deformities of extremities. Neurologic:  Normal speech and language. No gross focal neurologic deficits are appreciated.  Skin:  Skin is warm, dry and intact. Psychiatric: Mood and affect are anxious and upset but appropriate and polite with staff and me.  No emergent psychiatric warning signs.  Most consistent with panic attack.  ____________________________________________   LABS (all labs ordered are listed, but only abnormal results are displayed)  Labs Reviewed  CBC - Abnormal; Notable for the following components:      Result Value   WBC 10.6 (*)    All other components within normal limits  COMPREHENSIVE METABOLIC  PANEL - Abnormal; Notable for the following components:   CO2 21 (*)    All other components within normal limits  TROPONIN I (HIGH SENSITIVITY)  TROPONIN I (HIGH SENSITIVITY)   ____________________________________________  EKG  ED ECG REPORT I, Loleta Rose, the attending physician, personally viewed and interpreted this ECG.  Date: 04/26/2020 EKG Time: 21:02 Rate: 94 Rhythm: normal sinus rhythm QRS Axis: normal Intervals: normal ST/T Wave abnormalities: Non-specific ST segment / T-wave changes, but no clear evidence of acute ischemia. Narrative Interpretation: no definitive evidence of acute ischemia; does not meet STEMI criteria.   ____________________________________________  RADIOLOGY I, Loleta Rose, personally viewed and evaluated these images (plain radiographs) as part of my medical decision making, as well as reviewing the written report by the radiologist.  ED MD interpretation:  No acute abnormalities  Official radiology report(s): DG Chest 2 View  Result Date: 04/26/2020 CLINICAL DATA:  Midsternal chest pain. EXAM: CHEST - 2 VIEW COMPARISON:  Radiograph 05/05/2018  FINDINGS: The cardiomediastinal contours are normal. The lungs are clear. Pulmonary vasculature is normal. No consolidation, pleural effusion, or pneumothorax. No acute osseous abnormalities are seen. IMPRESSION: Negative radiographs of the chest. Electronically Signed   By: Narda Rutherford M.D.   On: 04/26/2020 21:46    ____________________________________________   PROCEDURES   Procedure(s) performed (including Critical Care):  Procedures   ____________________________________________   INITIAL IMPRESSION / MDM / ASSESSMENT AND PLAN / ED COURSE  As part of my medical decision making, I reviewed the following data within the electronic MEDICAL RECORD NUMBER Nursing notes reviewed and incorporated, Labs reviewed , EKG interpreted , Old chart reviewed, Radiograph reviewed  and Notes from prior ED visits   Differential diagnosis includes, but is not limited to, panic attack, anxiety, COVID-19, other nonspecific respiratory virus, pneumonia, PE.  The patient is PERC negative.  She has stable vital signs.  After her long wait atypical migraine started off for her and she is having recurrence of panic attack with hyperventilation and resulting tingling in her hands.  She is currently nauseated and holding an emesis bag and is clearly having an episode of acute anxiety or panic.  No evidence of ischemia on EKG.  I personally reviewed the patient's imaging and agree with the radiologist's interpretation that there is no evidence of any acute abnormalities on the chest x-ray.  Comprehensive metabolic panel, CBC, and high-sensitivity troponins are normal.  She is able to speak with me coherently and is calm down after I talked with her.  I provided reassurance that her medical work-up is reassuring including her EKG, vital signs, chest x-ray, labs, etc.  She has no focal neurological deficits.  She agrees that this is most likely a panic attack.  She had a bad reaction to Reglan which she describes as  "hyperactivity" in the past.  I gave her the option for oral medication such as Ativan or for treating for her acute migraine and she prefers the migraine treatment.  I ordered the following migraine "cocktail" for migraine treatment: Droperidol 2.5 mg IV, Decadron 10 mg IV, Benadryl 12.5 mg IV, and 500 mL normal saline IV bolus.  I am holding off on Toradol given a nonspecific NSAID allergic reaction.  Patient understands and agrees with the plan.       Clinical Course as of 04/27/20 0554  Tue Apr 27, 2020  9211 Patient feels much better, got some sleep, said her symptoms have resolved and she is ready to go home. A ride is here  for her. I gave my usual and customary return precautions. [CF]    Clinical Course User Index [CF] Loleta Rose, MD     ____________________________________________  FINAL CLINICAL IMPRESSION(S) / ED DIAGNOSES  Final diagnoses:  Panic attack  Anxiety  Migraine without status migrainosus, not intractable, unspecified migraine type     MEDICATIONS GIVEN DURING THIS VISIT:  Medications  droperidol (INAPSINE) 2.5 MG/ML injection 2.5 mg (2.5 mg Intravenous Given 04/27/20 0449)  diphenhydrAMINE (BENADRYL) injection 12.5 mg (12.5 mg Intravenous Given 04/27/20 0443)  dexamethasone (DECADRON) injection 10 mg (10 mg Intravenous Given 04/27/20 0443)  sodium chloride 0.9 % bolus 500 mL ( Intravenous Stopped 04/27/20 0513)     ED Discharge Orders    None      *Please note:  Ilo L Zackery was evaluated in Emergency Department on 04/27/2020 for the symptoms described in the history of present illness. She was evaluated in the context of the global COVID-19 pandemic, which necessitated consideration that the patient might be at risk for infection with the SARS-CoV-2 virus that causes COVID-19. Institutional protocols and algorithms that pertain to the evaluation of patients at risk for COVID-19 are in a state of rapid change based on information released by  regulatory bodies including the CDC and federal and state organizations. These policies and algorithms were followed during the patient's care in the ED.  Some ED evaluations and interventions may be delayed as a result of limited staffing during and after the pandemic.*  Note:  This document was prepared using Dragon voice recognition software and may include unintentional dictation errors.   Loleta Rose, MD 04/27/20 [phone]

## 2020-04-27 NOTE — Progress Notes (Deleted)
    SUBJECTIVE:   CHIEF COMPLAINT / HPI:  Past Medical History:  Diagnosis Date  . Anxiety   . Depression   . Migraines   . MS (multiple sclerosis) (HCC)   . Neuromuscular disorder (HCC)     CHEST PAIN  Chest pain- started 2 weeks worsening. Last night went away. Vomited x 1 . Hot bath and resting helped. Took Xanax and took something for indigestion which offered slight relief.   Pain located mid chest and "all over chest." Pt c/o back pain that " lasted a minute." Pt stated the pain comes and goes but yesterday it was the worse it has been.  Pt c/o pain 6/10 tightness and throbbing. No breathing difficulties. Pt is on Depo and currently vapes.   Seen in ED this morning for same. Labs and CXR wnl, EKG NSR.  Normal exam and vitals. Received migraine cocktail with relief.   Time since onset: Duration:{Blank single:19197::"days","weeks","months"} Onset: {Blank single:19197::"sudden","gradual"} Quality: {Blank multiple:19196::"sharp","dull","aching","burning","cramping","ill-defined","itchy","pressure-like","pulling","shooting","sore","stabbing","tender","tearing","throbbing"} Severity: {Blank single:19197::"mild","moderate","severe","1/10","2/10","3/10","4/10","5/10","6/10","7/10","8/10","9/10","10/10"} Location: {Blank single:19197::"substernal","left para substernal","right para substernal","subxiphoid","upper right","upper left","upper sternum"} Radiation: {Blank single:19197::"none","neck","jaw","left arm","right arm","back"} Episode duration:  Frequency: {Blank single:19197::"constant","intermittent","occasional","rare","every few minutes","a few times a hour","a few times a day","a few times a week","a few times a month","a few times a year"} Related to exertion: {Blank single:19197::"yes","no"} Activity when pain started:  Trauma: {Blank single:19197::"yes","no"} Anxiety/recent stressors: {Blank single:19197::"yes","no"} Aggravating factors:  Alleviating factors:  Status:  {Blank multiple:19196::"better","worse","stable","fluctuating"} Treatments attempted: {Blank multiple:19196::"nothing","APAP","ibuprofen","antacids"}  Current pain status: {Blank single:19197::"pain free","in pain","chest wall tender"} Shortness of breath: {Blank single:19197::"yes","no"} Cough: {Blank single:19197::"yes","no","yes, productive","yes, non-productive"} Nausea: {Blank single:19197::"yes","no"} Diaphoresis: {Blank single:19197::"yes","no"} Heartburn: {Blank single:19197::"yes","no"} Palpitations: {Blank single:19197::"yes","no"}  Currently vapes and MJ use.   OBJECTIVE:   There were no vitals taken for this visit.  ***  ASSESSMENT/PLAN:   No problem-specific Assessment & Plan notes found for this encounter.     Caro Laroche, DO [facility]

## 2020-04-27 NOTE — Discharge Instructions (Signed)
You have been seen in the Emergency Department (ED) today for chest pain.  As we have discussed today?s test results are normal, and we feel it is likely that panic attacks may be causing your symptoms. ° °Please follow up with the recommended doctor as instructed above in these documents regarding today?s emergent visit and your recent symptoms to discuss further management.  Continue to take your regular medications. If you are not doing so already, consider taking a daily baby aspirin (81 mg), at least until you follow up with your doctor. ° °Return to the Emergency Department (ED) if you experience any further chest pain/pressure/tightness, difficulty breathing, or sudden sweating, or other symptoms that concern you. °

## 2020-04-28 ENCOUNTER — Telehealth: Payer: Self-pay

## 2020-04-28 NOTE — Telephone Encounter (Signed)
Transition Care Management Unsuccessful Follow-up Telephone Call  Date of discharge and from where:  04/27/2020 [facility] ED  Attempts:  1st Attempt  Reason for unsuccessful TCM follow-up call:  Left voice message

## 2020-04-29 NOTE — Telephone Encounter (Signed)
Transition Care Management Unsuccessful Follow-up Telephone Call  Date of discharge and from where:  04/27/2020 [facility] ED  Attempts:  2nd Attempt  Reason for unsuccessful TCM follow-up call:  Left voice message

## 2020-04-30 NOTE — Telephone Encounter (Signed)
Transition Care Management Unsuccessful Follow-up Telephone Call  Date of discharge and from where:   04/27/2020 [facility] ED  Attempts:  3rd Attempt  Reason for unsuccessful TCM follow-up call:  Left voice message

## 2020-06-18 ENCOUNTER — Encounter: Payer: Self-pay | Admitting: Nurse Practitioner

## 2020-06-18 ENCOUNTER — Other Ambulatory Visit: Payer: Self-pay

## 2020-06-18 ENCOUNTER — Ambulatory Visit (INDEPENDENT_AMBULATORY_CARE_PROVIDER_SITE_OTHER): Payer: Medicaid Other | Admitting: Nurse Practitioner

## 2020-06-18 VITALS — BP 137/86 | HR 94 | Temp 98.6°F | Ht 64.49 in | Wt 150.0 lb

## 2020-06-18 DIAGNOSIS — G43009 Migraine without aura, not intractable, without status migrainosus: Secondary | ICD-10-CM | POA: Diagnosis not present

## 2020-06-18 MED ORDER — KETOROLAC TROMETHAMINE 60 MG/2ML IM SOLN: 60.0000 mg | Freq: Once | INTRAMUSCULAR | Status: DC

## 2020-06-18 MED ORDER — PROMETHAZINE HCL 12.5 MG PO TABS: 12.5000 mg | ORAL_TABLET | Freq: Three times a day (TID) | ORAL | 0 refills | Status: DC | PRN

## 2020-06-18 MED ORDER — KETOROLAC TROMETHAMINE 60 MG/2ML IM SOLN
30.0000 mg | Freq: Once | INTRAMUSCULAR | Status: AC
  Administered 2020-06-18: 30 mg via INTRAMUSCULAR

## 2020-06-18 MED ORDER — PROPRANOLOL HCL 20 MG PO TABS: 20.0000 mg | ORAL_TABLET | Freq: Every day | ORAL | 6 refills | Status: DC

## 2020-06-18 NOTE — Assessment & Plan Note (Signed)
Chronic, with current underlying acute HA.  No red flag symptoms or exam findings.  Script for Propranolol 20 MG to trial as maintenance, has higher resting HR at baseline and discussed with her benefit of BB for migraine therapy.  Educated on medication, to take daily.  Start low and will increase as needed.  Continue Imitrex as needed.  Toradol 30 MG IM in office today, has taken before without issue per her report.  Phenergan script for nausea sent.  Continue collaboration with neurology.  Return in 8 weeks for follow-up, sooner if worsening.

## 2020-06-18 NOTE — Patient Instructions (Signed)

## 2020-06-18 NOTE — Progress Notes (Signed)
BP 137/86   Pulse 94 Comment: apical  Temp 98.6 F (37 C) (Oral)   Ht 5' 4.49" (1.638 m)   Wt 150 lb (68 kg)   SpO2 98%   BMI 25.36 kg/m    Subjective:    Patient ID: Jennifer Watts, female    DOB: 1995/09/16, 25 y.o.   MRN: [id number]  HPI: Jennifer Watts is a 25 y.o. female  Chief Complaint  Patient presents with  . Migraine    For past week. Patient last took her Imitrex yesterday with some relief.   Was given migraine "cocktail" for migraine treatment: Droperidol 2.5 mg IV, Decadron 10 mg IV, Benadryl 12.5 mg IV, and 500 mL normal saline IV bolus in ER in December that she worked great to make go away.   MIGRAINES Presents for acute migraine, unable to get into her neurologist today.  Patient last took her Imitrex yesterday with mild relief. Was given migraine cocktail on 04/27/20 for similar: Droperidol 2.5 mg IV, Decadron 10 mg IV, Benadryl 12.5 mg IV, and 500 mL normal saline IV bolus in ER -- this worked well and improved acute migraine episode.  Was told by neurology his morning that primary care could provide Toradol.    At baseline for migraines takes Imitrex as needed.  No maintenance medications -- has tried injections with no benefit and Nortriptyline with no benefit.  Has not tried BB for maintenance.  Reports daily migraines. Duration: chronic Onset: sudden Severity: 6/10 Quality: dull, aching and throbbing Frequency: constant Location: frontal -- more right side and to back Headache duration: 24 hours Radiation: no Time of day headache occurs: varies Alleviating factors: Imitrex and cocktail in ER Aggravating factors: nothing known Headache status at time of visit: current headache Treatments attempted: Treatments attempted: ice and triptans   Aura: yes Nausea:  yes Vomiting: yes Photophobia:  yes Phonophobia:  yes Effect on social functioning:  yes Numbers of missed days of school/work each month: none Confusion:  no Gait disturbance/ataxia:   no Behavioral changes:  no Fevers:  no  Relevant past medical, surgical, family and social history reviewed and updated as indicated. Interim medical history since our last visit reviewed. Allergies and medications reviewed and updated.  Review of Systems  Constitutional: Negative for activity change, appetite change, diaphoresis, fatigue and fever.  Respiratory: Negative for cough, chest tightness and shortness of breath.   Cardiovascular: Negative for chest pain, palpitations and leg swelling.  Gastrointestinal: Negative.   Neurological: Positive for headaches. Negative for dizziness, syncope, facial asymmetry, speech difficulty, weakness, light-headedness and numbness.  Psychiatric/Behavioral: Negative.     Per HPI unless specifically indicated above     Objective:    BP 137/86   Pulse 94 Comment: apical  Temp 98.6 F (37 C) (Oral)   Ht 5' 4.49" (1.638 m)   Wt 150 lb (68 kg)   SpO2 98%   BMI 25.36 kg/m   Wt Readings from Last 3 Encounters:  06/18/20 150 lb (68 kg)  04/26/20 150 lb (68 kg)  11/25/19 150 lb 9.6 oz (68.3 kg)    Physical Exam Vitals and nursing note reviewed.  Constitutional:      General: She is awake. She is not in acute distress.    Appearance: She is well-developed and well-groomed. She is not ill-appearing.  HENT:     Head: Normocephalic.     Right Ear: Hearing normal.     Left Ear: Hearing normal.  Eyes:  General: Lids are normal.        Right eye: No discharge.        Left eye: No discharge.     Conjunctiva/sclera: Conjunctivae normal.     Pupils: Pupils are equal, round, and reactive to light.  Cardiovascular:     Rate and Rhythm: Normal rate and regular rhythm.     Heart sounds: Normal heart sounds. No murmur heard. No gallop.   Pulmonary:     Effort: Pulmonary effort is normal. No accessory muscle usage or respiratory distress.     Breath sounds: Normal breath sounds.  Abdominal:     General: Bowel sounds are normal.      Palpations: Abdomen is soft.  Musculoskeletal:     Cervical back: Normal range of motion and neck supple.     Right lower leg: No edema.     Left lower leg: No edema.  Skin:    General: Skin is warm and dry.  Neurological:     Mental Status: She is alert and oriented to person, place, and time.     Cranial Nerves: Cranial nerves are intact.     Motor: Motor function is intact.     Coordination: Coordination is intact.     Gait: Gait is intact.     Deep Tendon Reflexes: Reflexes are normal and symmetric.     Reflex Scores:      Brachioradialis reflexes are 2+ on the right side and 2+ on the left side.      Patellar reflexes are 2+ on the right side and 2+ on the left side. Psychiatric:        Attention and Perception: Attention normal.        Mood and Affect: Mood normal.        Speech: Speech normal.        Behavior: Behavior normal. Behavior is cooperative.        Thought Content: Thought content normal.     Results for orders placed or performed during the hospital encounter of 04/27/20  CBC  Result Value Ref Range   WBC 10.6 (H) 4.0 - 10.5 K/uL   RBC 4.56 3.87 - 5.11 MIL/uL   Hemoglobin 14.4 12.0 - 15.0 g/dL   HCT 10.2 58.5 - 27.7 %   MCV 92.1 80.0 - 100.0 fL   MCH 31.6 26.0 - 34.0 pg   MCHC 34.3 30.0 - 36.0 g/dL   RDW 82.4 23.5 - 36.1 %   Platelets 332 150 - 400 K/uL   nRBC 0.0 0.0 - 0.2 %  Comprehensive metabolic panel  Result Value Ref Range   Sodium 139 135 - 145 mmol/L   Potassium 3.6 3.5 - 5.1 mmol/L   Chloride 106 98 - 111 mmol/L   CO2 21 (L) 22 - 32 mmol/L   Glucose, Bld 97 70 - 99 mg/dL   BUN 14 6 - 20 mg/dL   Creatinine, Ser 4.43 0.44 - 1.00 mg/dL   Calcium 9.5 8.9 - 15.4 mg/dL   Total Protein 7.0 6.5 - 8.1 g/dL   Albumin 4.3 3.5 - 5.0 g/dL   AST 16 15 - 41 U/L   ALT 14 0 - 44 U/L   Alkaline Phosphatase 56 38 - 126 U/L   Total Bilirubin 0.9 0.3 - 1.2 mg/dL   GFR, Estimated >00 >86 mL/min   Anion gap 12 5 - 15  Troponin I (High Sensitivity)   Result Value Ref Range   Troponin I (High Sensitivity) <2 <18  ng/L  Troponin I (High Sensitivity)  Result Value Ref Range   Troponin I (High Sensitivity) <2 <18 ng/L      Assessment & Plan:   Problem List Items Addressed This Visit      Cardiovascular and Mediastinum   Migraine without aura and without status migrainosus, not intractable - Primary    Chronic, with current underlying acute HA.  No red flag symptoms or exam findings.  Script for Propranolol 20 MG to trial as maintenance, has higher resting HR at baseline and discussed with her benefit of BB for migraine therapy.  Educated on medication, to take daily.  Start low and will increase as needed.  Continue Imitrex as needed.  Toradol 30 MG IM in office today, has taken before without issue per her report.  Phenergan script for nausea sent.  Continue collaboration with neurology.  Return in 8 weeks for follow-up, sooner if worsening.      Relevant Medications   propranolol (INDERAL) 20 MG tablet   ketorolac (TORADOL) injection 30 mg       Follow up plan: Return in about 8 weeks (around 08/13/2020) for Migraine and Mood.

## 2020-06-21 DIAGNOSIS — F32A Depression, unspecified: Secondary | ICD-10-CM | POA: Diagnosis not present

## 2020-06-21 DIAGNOSIS — H538 Other visual disturbances: Secondary | ICD-10-CM | POA: Diagnosis not present

## 2020-06-21 DIAGNOSIS — Z79899 Other long term (current) drug therapy: Secondary | ICD-10-CM | POA: Diagnosis not present

## 2020-06-21 DIAGNOSIS — G43009 Migraine without aura, not intractable, without status migrainosus: Secondary | ICD-10-CM | POA: Diagnosis not present

## 2020-06-23 DIAGNOSIS — Z79899 Other long term (current) drug therapy: Secondary | ICD-10-CM | POA: Diagnosis not present

## 2020-06-24 ENCOUNTER — Other Ambulatory Visit (HOSPITAL_COMMUNITY): Payer: Self-pay | Admitting: Neurology

## 2020-06-24 ENCOUNTER — Other Ambulatory Visit: Payer: Self-pay | Admitting: Neurology

## 2020-06-24 DIAGNOSIS — G35 Multiple sclerosis: Secondary | ICD-10-CM

## 2020-06-24 DIAGNOSIS — G43009 Migraine without aura, not intractable, without status migrainosus: Secondary | ICD-10-CM

## 2020-06-24 DIAGNOSIS — H538 Other visual disturbances: Secondary | ICD-10-CM

## 2020-06-30 ENCOUNTER — Other Ambulatory Visit: Payer: Self-pay | Admitting: Nurse Practitioner

## 2020-06-30 DIAGNOSIS — U071 COVID-19: Secondary | ICD-10-CM

## 2020-07-01 ENCOUNTER — Telehealth: Payer: Self-pay

## 2020-07-01 ENCOUNTER — Telehealth: Payer: Self-pay | Admitting: Family

## 2020-07-01 ENCOUNTER — Other Ambulatory Visit: Payer: Self-pay | Admitting: Family

## 2020-07-01 DIAGNOSIS — U071 COVID-19: Secondary | ICD-10-CM

## 2020-07-01 DIAGNOSIS — G35 Multiple sclerosis: Secondary | ICD-10-CM

## 2020-07-01 NOTE — Telephone Encounter (Signed)
Follow up call to RN pre-screen.   Ms. Liwanag had symptoms starting on 2/12. Currently having cough, sore throat, and congestion. Vaccinated and does not have booster. Covid tested positive at home. 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 We discussed the risks, benefits, and potential financial costs associated with Sotrovimab treatment and she wishes to proceed.   Hello Duyen L Dunkerson,   We contacted you because you were recently diagnosed with COVID-19 and may benefit from a new treatment for mild to moderate disease. This treatment helps reduce the chance of being hospitalized. For some patients with medical conditions that may increase the chances of an infection, the treatment also decreases the risk for serious symptoms related to COVID-19.   The Food and Drug Administration (FDA) approved emergency use of a new drug to treat patients with mild to moderate symptoms who have risk factors that could cause severe symptoms related to COVID-19. This new treatment is a monoclonal antibody. It works by attaching like a magnet to the SARS-CoV2 virus (the virus that causes COVID-19) and stops it from infecting more cells in your body. It does not kill the virus, but it prevents it from spreading throughout your body with the hope that it will decrease your symptoms after it is administered.   This new drug is an intravenous (IV) infusion called Sotrovimab that is given over one 30-minute session in our [facility] outpatient infusion clinic. You will need to stay about 60 minutes after the infusion to ensure you are tolerating it well and to watch for any allergic reaction to the medication. More information will be given to you at the time of your appointment.   Important information:  . The potential side effects: 2-4% of recipients experience nausea, vomiting, diarrhea, dizziness, headaches, itching, worsening fevers or chills for around 24 hours. . There have been no serious infusion-related reactions. . Of  the more than 3,000 patients who received the infusion, only one had an allergic response that ended once the infusion was stopped. This is why we monitor all of our patients closely for 60 minutes after the infusion.  . The COVID-19 vaccine (including boosters) must be delayed at least 90 days after receiving this infusion.  . The medication itself is free, but your insurance will be charged an infusion fee. The amount you may owe later varies from insurance to insurance. If you do not have insurance, we can put you in touch with our billing department. Please contact your insurance agent to discuss prior to your appointment if you would like further details about billing specific to your policy. The CMS code is: M58  . If you have been tested outside of a [facility], you MUST bring a copy of your positive test with you the morning of your appointment. You may take a photo of this and upload to your MyChart portal,  have the testing facility fax the result to [phone] or email a copy to [email or web address].    You have been scheduled to receive the monoclonal antibody therapy at [facility]:  07/02/20 at 12:30 pm   The address for the infusion clinic site is:   --The GPS address is [address], and the parking is located near the [employer] building where you will see a "COVID19 Infusion" feather banner marking the entrance to parking. (See photos below.)            --Enter into the 2nd entrance where the "wave, flag banner" is at the  road. Turn into this 2nd entrance and immediately turn left or right to park in one of the marked spaces.   --Please stay in your car and call the desk for assistance inside at [phone]. Let us know which space you are in.    --The average time in the department is roughly 90 minutes to two hours for monoclonal treatment. This includes preparation of the medication, IV start and the required 60-minute monitoring after the  infusion.     Should you develop worsening shortness of breath, chest pain or severe breathing problems, please do not wait for this appointment and go to the emergency room for evaluation and treatment instead. You will undergo another oxygen screen before your infusion to ensure this is the best treatment option for you. There is a chance that the best decision may be to send you to the emergency room for evaluation at the time of your appointment.    The day of your visit, you should: Marland Kitchen Get plenty of rest the night before and drink plenty of water. . Eat a light meal/snack before coming and take your medications as prescribed.  . Wear warm, comfortable clothes with a shirt that can roll-up over the elbow (will need IV start).  . Wear a mask.  . Consider bringing an activity to help pass the time.   Marcos Eke, NP 07/01/2020 1:32 PM

## 2020-07-01 NOTE — Progress Notes (Signed)
I connected by phone with Jennifer Watts on 07/01/2020 at 1:33 PM to discuss the potential use of a new treatment for mild to moderate COVID-19 viral infection in non-hospitalized patients.  This patient is a 25 y.o. female that meets the FDA criteria for Emergency Use Authorization of COVID monoclonal antibody sotrovimab.  Has a (+) direct SARS-CoV-2 viral test result  Has mild or moderate COVID-19   Is NOT hospitalized due to COVID-19  Is within 10 days of symptom onset  Has at least one of the high risk factor(s) for progression to severe COVID-19 and/or hospitalization as defined in EUA.  Specific high risk criteria : Neurodevelopmental disorder   I have spoken and communicated the following to the patient or parent/caregiver regarding COVID monoclonal antibody treatment:  1. FDA has authorized the emergency use for the treatment of mild to moderate COVID-19 in adults and pediatric patients with positive results of direct SARS-CoV-2 viral testing who are 5 years of age and older weighing at least 40 kg, and who are at high risk for progressing to severe COVID-19 and/or hospitalization.  2. The significant known and potential risks and benefits of COVID monoclonal antibody, and the extent to which such potential risks and benefits are unknown.  3. Information on available alternative treatments and the risks and benefits of those alternatives, including clinical trials.  4. Patients treated with COVID monoclonal antibody should continue to self-isolate and use infection control measures (e.g., wear mask, isolate, social distance, avoid sharing personal items, clean and disinfect "high touch" surfaces, and frequent handwashing) according to CDC guidelines.   5. The patient or parent/caregiver has the option to accept or refuse COVID monoclonal antibody treatment.  After reviewing this information with the patient, the patient has agreed to receive one of the available covid 19  monoclonal antibodies and will be provided an appropriate fact sheet prior to infusion.   Jeanine Luz, FNP 07/01/2020 1:33 PM

## 2020-07-01 NOTE — Telephone Encounter (Signed)
Pt. Called back and would like to speak with APP.

## 2020-07-01 NOTE — Telephone Encounter (Signed)
Called to discuss with patient about COVID-19 symptoms and the use of one of the available treatments for those with mild to moderate Covid symptoms and at a high risk of hospitalization.  Pt appears to qualify for outpatient treatment due to co-morbid conditions and/or a member of an at-risk group in accordance with the FDA Emergency Use Authorization.    Symptom onset: 06/26/20 per chart Vaccinated: Yes Booster? No Immunocompromised? Yes Qualifiers: MS  Unable to reach pt - Left message with call back number [phone].  Esther Hardy

## 2020-07-02 ENCOUNTER — Ambulatory Visit (HOSPITAL_COMMUNITY): Payer: Medicaid Other

## 2020-07-07 ENCOUNTER — Ambulatory Visit: Payer: Medicaid Other

## 2020-07-20 ENCOUNTER — Ambulatory Visit: Payer: Medicaid Other

## 2020-07-30 ENCOUNTER — Other Ambulatory Visit: Payer: Self-pay | Admitting: Neurology

## 2020-07-30 DIAGNOSIS — G43009 Migraine without aura, not intractable, without status migrainosus: Secondary | ICD-10-CM

## 2020-07-30 DIAGNOSIS — G35 Multiple sclerosis: Secondary | ICD-10-CM

## 2020-07-30 DIAGNOSIS — H538 Other visual disturbances: Secondary | ICD-10-CM

## 2020-08-01 IMAGING — CR DG ANKLE COMPLETE 3+V*L*
3 series · 3 of 3 positions shown · non-contrast
Comparison: None.

CLINICAL DATA: Left ankle pain and swelling.  No injury.

EXAM:
LEFT ANKLE COMPLETE - 3+ VIEW

[ankle ap]
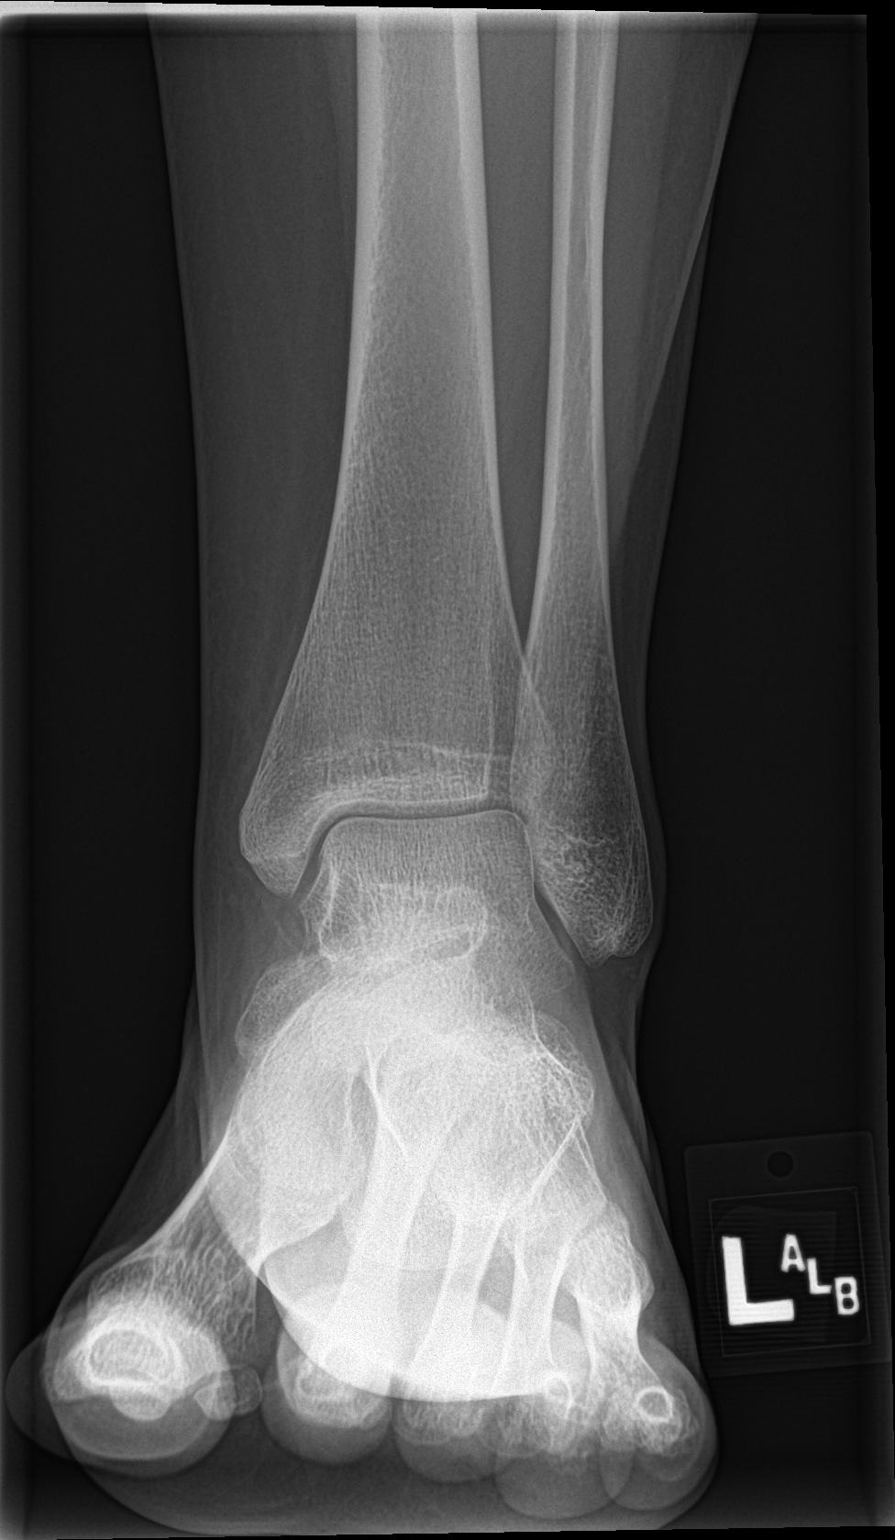

[ankle obl]
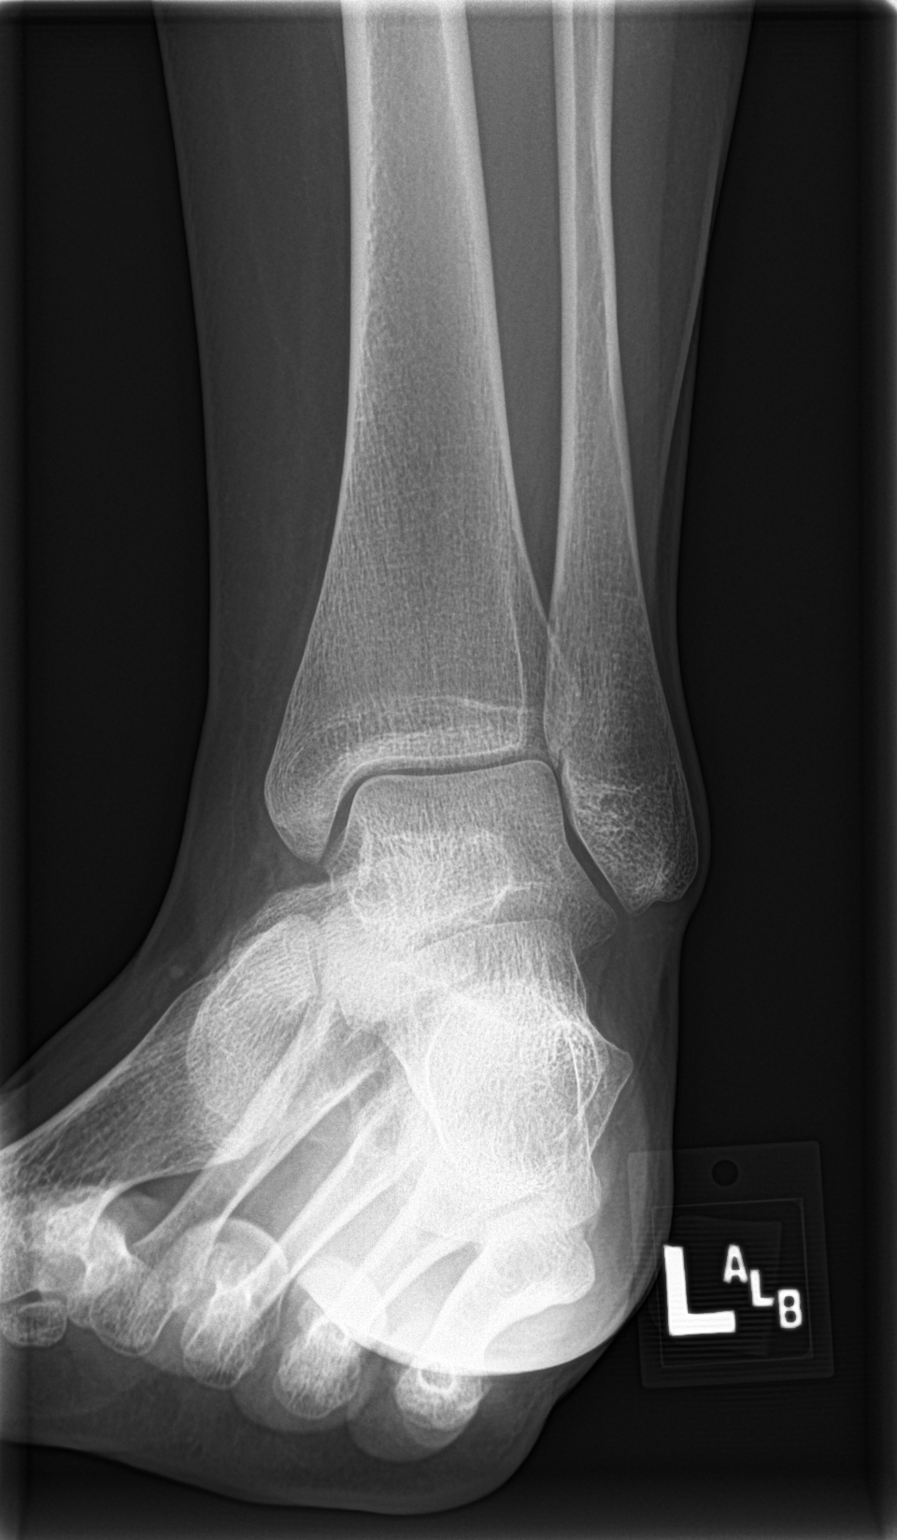

[ankle lat]
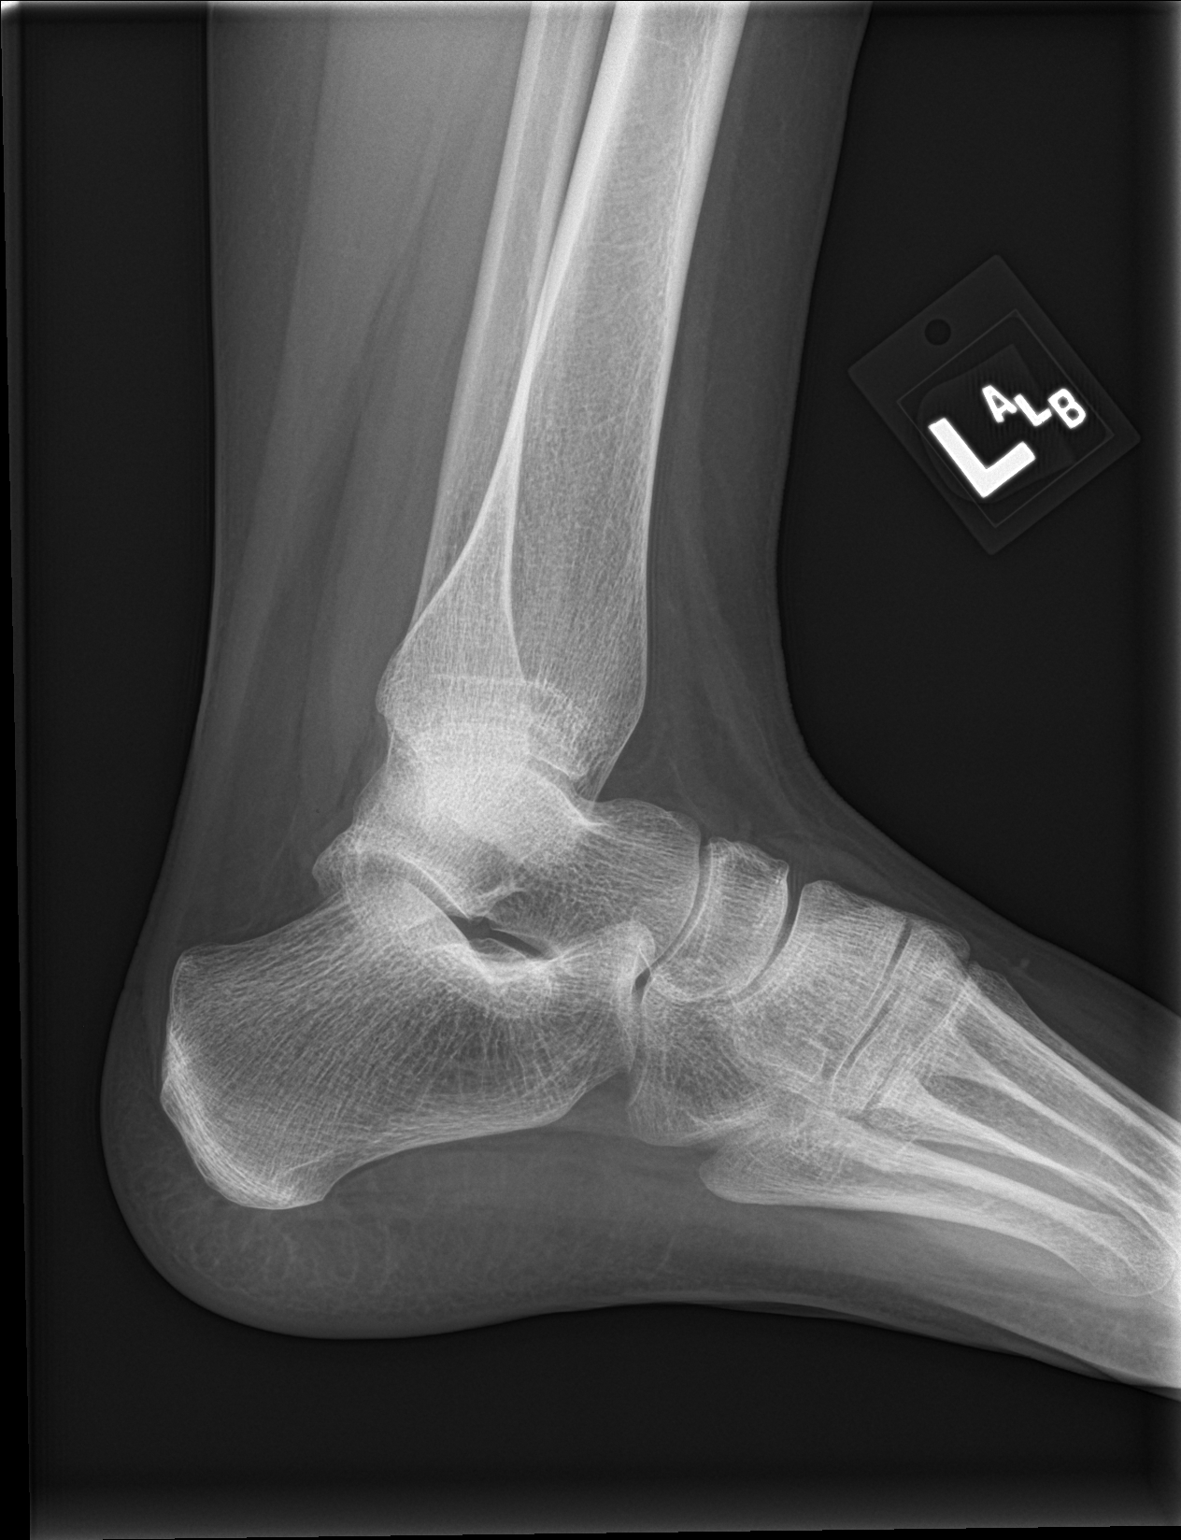

[3 of 3 positions shown; findings below may reference images not displayed]

FINDINGS: No acute fracture or dislocation. The ankle mortise is symmetric.
The talar dome is intact. No tibiotalar joint effusion. Joint spaces
are preserved. Bone mineralization is normal. Soft tissues are
unremarkable.
IMPRESSION: Negative.

## 2020-08-02 ENCOUNTER — Ambulatory Visit
Admission: RE | Admit: 2020-08-02 | Discharge: 2020-08-02 | Disposition: A | Discharge status: Home / Self Care | Payer: Medicaid Other | Source: Ambulatory Visit | Attending: Neurology | Admitting: Neurology

## 2020-08-02 ENCOUNTER — Other Ambulatory Visit: Payer: Self-pay

## 2020-08-02 DIAGNOSIS — G43009 Migraine without aura, not intractable, without status migrainosus: Secondary | ICD-10-CM | POA: Insufficient documentation

## 2020-08-02 DIAGNOSIS — G35 Multiple sclerosis: Secondary | ICD-10-CM

## 2020-08-02 DIAGNOSIS — H538 Other visual disturbances: Secondary | ICD-10-CM | POA: Insufficient documentation

## 2020-08-02 MED ORDER — GADOBUTROL 1 MMOL/ML IV SOLN
6.0000 mL | Freq: Once | INTRAVENOUS | Status: AC | PRN
  Administered 2020-08-02: 6 mL via INTRAVENOUS

## 2020-08-16 ENCOUNTER — Other Ambulatory Visit: Payer: Self-pay | Admitting: Nurse Practitioner

## 2020-09-03 ENCOUNTER — Other Ambulatory Visit: Payer: Self-pay | Admitting: Nurse Practitioner

## 2020-09-03 MED ORDER — FAMOTIDINE 20 MG PO TABS: 20.0000 mg | ORAL_TABLET | Freq: Two times a day (BID) | ORAL | 3 refills | Status: DC

## 2020-09-07 ENCOUNTER — Encounter: Payer: Self-pay | Admitting: Nurse Practitioner

## 2020-09-07 ENCOUNTER — Ambulatory Visit: Payer: Self-pay

## 2020-09-07 ENCOUNTER — Telehealth (INDEPENDENT_AMBULATORY_CARE_PROVIDER_SITE_OTHER): Payer: Medicaid Other | Admitting: Nurse Practitioner

## 2020-09-07 DIAGNOSIS — J029 Acute pharyngitis, unspecified: Secondary | ICD-10-CM | POA: Diagnosis not present

## 2020-09-07 MED ORDER — AZITHROMYCIN 250 MG PO TABS: ORAL_TABLET | ORAL | 0 refills | Status: DC

## 2020-09-07 MED ORDER — LIDOCAINE VISCOUS HCL 2 % MT SOLN: 15.0000 mL | OROMUCOSAL | 0 refills | Status: DC | PRN

## 2020-09-07 NOTE — Addendum Note (Signed)
Addended by: Aura Dials T on: 09/07/2020 02:49 PM   Modules accepted: Orders

## 2020-09-07 NOTE — Assessment & Plan Note (Signed)
Symptoms present for 3 days with history of strep in past leading to hospitalization due to sepsis.  Have advised they perform Covid testing at home.  Unable to get to office today for strep testing.  At this time will prophylactic treat for strep infection due to her past history and risks + symptoms.  Is allergic to Penicillin. Will treat with Azithromycin, script sent, + viscous Lidocaine.  Recommend salt water gargles at home and warm liquids.  To notify provider of Covid testing results and self quarantine at this time until symptoms improved.  If ongoing or worsening immediately return to office.

## 2020-09-07 NOTE — Patient Instructions (Signed)
Strep Throat, Adult Strep throat is an infection of the throat. It is caused by germs (bacteria). Strep throat is common during the cold months of the year. It mostly affects children who are 5-23 years old. However, people of all ages can get it at any time of the year. When strep throat affects the tonsils, it is called tonsillitis. When it affects the back of the throat, it is called pharyngitis. This infection spreads from person to person through coughing, sneezing, or having close contact. What are the causes? This condition is caused by the Streptococcus pyogenes germ. What increases the risk? You are more likely to develop this condition if:  You care for young children. Children are more likely to get strep throat and may spread it to others.  You go to crowded places. Germs can spread easily in such places.  You kiss or touch someone who has strep throat. What are the signs or symptoms? Symptoms of this condition include:  Fever or chills.  Redness, swelling, or pain in the tonsils or throat.  Pain or trouble when swallowing.  White or yellow spots on the tonsils or throat.  Tender glands in the neck and under the jaw.  Bad breath.  Red rash all over the body. This is rare. How is this treated? This condition may be treated with:  Medicines that kill germs (antibiotics).  Medicines that treat pain or fever. These include: ? Ibuprofen or acetaminophen. ? Aspirin, only for patients who are over the age of 18. ? Throat lozenges. ? Throat sprays. Follow these instructions at home: Medicines  Take over-the-counter and prescription medicines only as told by your doctor.  Take your antibiotic medicine as told by your doctor. Do not stop taking the antibiotic even if you start to feel better.   Eating and drinking  If you have trouble swallowing, eat soft foods until your throat feels better.  Drink enough fluid to keep your pee (urine) pale yellow.  To help with  pain, you may have: ? Warm fluids, such as soup and tea. ? Cold fluids, such as frozen desserts or popsicles.   General instructions  Rinse your mouth (gargle) with a salt-water mixture 3-4 times a day or as needed. To make a salt-water mixture, dissolve -1 tsp (3-6 g) of salt in 1 cup (237 mL) of warm water.  Rest as much as you can.  Stay home from work or school until you have been taking antibiotics for 24 hours.  Avoid smoking or being around people who smoke.  Keep all follow-up visits as told by your doctor. This is important. How is this prevented?  Do not share food, drinking cups, or personal items. They can cause the germs to spread.  Wash your hands well with soap and water. Make sure that all people in your house wash their hands well.  Have family members tested if they have a fever or a sore throat. They may need an antibiotic if they have strep throat.   Contact a doctor if:  You have swelling in your neck that keeps getting bigger.  You get a rash, cough, or earache.  You cough up a thick fluid that is green, yellow-brown, or bloody.  You have pain that does not get better with medicine.  Your symptoms get worse instead of getting better.  You have a fever. Get help right away if:  You vomit.  You have a very bad headache.  Your neck hurts or feels stiff.    You have chest pain or are short of breath.  You have drooling, very bad throat pain, or changes in your voice.  Your neck is swollen, or the skin gets red and tender.  Your mouth is dry, or you are peeing less than normal.  You keep feeling more tired or have trouble waking up.  Your joints are red or painful. Summary  Strep throat is an infection of the throat. It is caused by germs (bacteria).  This infection can spread from person to person through coughing, sneezing, or having close contact.  Take your medicines, including antibiotics, as told by your doctor. Do not stop taking the  antibiotic even if you start to feel better.  To prevent the spread of germs, wash your hands well with soap and water. Have others do the same. Do not share food, drinking cups, or personal items.  Get help right away if you have a bad headache, chest pain, shortness of breath, a stiff or painful neck, or you vomit. This information is not intended to replace advice given to you by your health care provider. Make sure you discuss any questions you have with your health care provider. Document Revised: 07/19/2018 Document Reviewed: 07/19/2018 Elsevier Patient Education  2021 Elsevier Inc.  

## 2020-09-07 NOTE — Progress Notes (Signed)
BP (!) 135/91   Pulse 99   Temp 98.3 F (36.8 C)   SpO2 99%    Subjective:    Patient ID: Jennifer Watts, female    DOB: 1995/10/31, 25 y.o.   MRN: [id number]  HPI: Jennifer Watts is a 25 y.o. female  Chief Complaint  Patient presents with  . Sore Throat    Pt states she has had a sore throat since Sunday, mainly hurts on the left side. Denies any other symptoms. Mother states that there are pus pockets on the patients throat    . This visit was completed via video visit through MyChart due to the restrictions of the COVID-19 pandemic. All issues as above were discussed and addressed. Physical exam was done as above through visual confirmation on video through MyChart. If it was felt that the patient should be evaluated in the office, they were directed there. The patient verbally consented to this visit. . Location of the patient: home . Location of the provider: work . Those involved with this call:  . Provider: Aura Dials, DNP . CMA: Malen Gauze, CMA . Front Desk/Registration: Sherlyn Lick  . Time spent on call: 21 minutes with patient face to face via video conference. More than 50% of this time was spent in counseling and coordination of care. 15 minutes total spent in review of patient's record and preparation of their chart.  . I verified patient identity using two factors (patient name and date of birth). Patient consents verbally to being seen via telemedicine visit today.    UPPER RESPIRATORY TRACT INFECTION Started with sore throat 2 days ago.  Mainly hurts to left side of throat, her mother looked at area and she reports a little yellow back in the throat.  States throat is red and inflamed.  Does have history of strep throat in past, was several years ago last and was hospitalized due to sepsis with this.  Has had 2 Covid vaccinations.  No loss of taste or smell.  Have Covid tests at home and will perform one. Fever: no Cough: no Shortness of breath:  no Wheezing: no Chest pain: no Chest tightness: no Chest congestion: no Nasal congestion: no Runny nose: no Post nasal drip: no Sneezing: no Sore throat: yes Swollen glands: no Sinus pressure: no Headache: no Face pain: no Toothache: no Ear pain: yes left Ear pressure: yes left Eyes red/itching:no Eye drainage/crusting: no  Vomiting: no Rash: no Fatigue: yes Sick contacts: no Strep contacts: no  Context: stable Recurrent sinusitis: no Relief with OTC cold/cough medications: no  Treatments attempted: none   Relevant past medical, surgical, family and social history reviewed and updated as indicated. Interim medical history since our last visit reviewed. Allergies and medications reviewed and updated.  Review of Systems  Constitutional: Negative.   HENT: Positive for ear pain, sore throat and trouble swallowing. Negative for congestion, ear discharge, nosebleeds, postnasal drip, rhinorrhea, sinus pressure, sinus pain, sneezing and voice change.   Respiratory: Negative.   Cardiovascular: Negative.   Gastrointestinal: Negative.   Psychiatric/Behavioral: Negative.     Per HPI unless specifically indicated above     Objective:    BP (!) 135/91   Pulse 99   Temp 98.3 F (36.8 C)   SpO2 99%   Wt Readings from Last 3 Encounters:  06/18/20 150 lb (68 kg)  04/26/20 150 lb (68 kg)  11/25/19 150 lb 9.6 oz (68.3 kg)    Physical Exam Vitals and nursing note  reviewed.  Constitutional:      General: She is awake. She is not in acute distress.    Appearance: She is well-developed and well-groomed. She is not ill-appearing.  HENT:     Head: Normocephalic.     Right Ear: Hearing normal.     Left Ear: Hearing normal.     Mouth/Throat:     Mouth: Mucous membranes are moist.     Pharynx: Pharyngeal swelling and posterior oropharyngeal erythema present.     Comments: Able to view briefly on virtual with moderate erythema to pharynx and mild edema.  No exudate noted, but  patient's mother reports noticing to left side. Eyes:     General: Lids are normal.        Right eye: No discharge.        Left eye: No discharge.     Conjunctiva/sclera: Conjunctivae normal.  Pulmonary:     Effort: Pulmonary effort is normal. No accessory muscle usage or respiratory distress.  Musculoskeletal:     Cervical back: Normal range of motion.  Neurological:     Mental Status: She is alert and oriented to person, place, and time.  Psychiatric:        Attention and Perception: Attention normal.        Mood and Affect: Mood normal.        Behavior: Behavior normal. Behavior is cooperative.        Thought Content: Thought content normal.        Judgment: Judgment normal.     Results for orders placed or performed during the hospital encounter of 04/27/20  CBC  Result Value Ref Range   WBC 10.6 (H) 4.0 - 10.5 K/uL   RBC 4.56 3.87 - 5.11 MIL/uL   Hemoglobin 14.4 12.0 - 15.0 g/dL   HCT 28.3 15.1 - 76.1 %   MCV 92.1 80.0 - 100.0 fL   MCH 31.6 26.0 - 34.0 pg   MCHC 34.3 30.0 - 36.0 g/dL   RDW 60.7 37.1 - 06.2 %   Platelets 332 150 - 400 K/uL   nRBC 0.0 0.0 - 0.2 %  Comprehensive metabolic panel  Result Value Ref Range   Sodium 139 135 - 145 mmol/L   Potassium 3.6 3.5 - 5.1 mmol/L   Chloride 106 98 - 111 mmol/L   CO2 21 (L) 22 - 32 mmol/L   Glucose, Bld 97 70 - 99 mg/dL   BUN 14 6 - 20 mg/dL   Creatinine, Ser 6.94 0.44 - 1.00 mg/dL   Calcium 9.5 8.9 - 85.4 mg/dL   Total Protein 7.0 6.5 - 8.1 g/dL   Albumin 4.3 3.5 - 5.0 g/dL   AST 16 15 - 41 U/L   ALT 14 0 - 44 U/L   Alkaline Phosphatase 56 38 - 126 U/L   Total Bilirubin 0.9 0.3 - 1.2 mg/dL   GFR, Estimated >62 >70 mL/min   Anion gap 12 5 - 15  Troponin I (High Sensitivity)  Result Value Ref Range   Troponin I (High Sensitivity) <2 <18 ng/L  Troponin I (High Sensitivity)  Result Value Ref Range   Troponin I (High Sensitivity) <2 <18 ng/L      Assessment & Plan:   Problem List Items Addressed This Visit       Other   Sore throat    Symptoms present for 3 days with history of strep in past leading to hospitalization due to sepsis.  Have advised they perform Covid testing at home.  Unable to get to office today for strep testing.  At this time will prophylactic treat for strep infection due to her past history and risks + symptoms.  Is allergic to Penicillin. Will treat with Azithromycin, script sent, + viscous Lidocaine.  Recommend salt water gargles at home and warm liquids.  To notify provider of Covid testing results and self quarantine at this time until symptoms improved.  If ongoing or worsening immediately return to office.          I discussed the assessment and treatment plan with the patient. The patient was provided an opportunity to ask questions and all were answered. The patient agreed with the plan and demonstrated an understanding of the instructions.   The patient was advised to call back or seek an in-person evaluation if the symptoms worsen or if the condition fails to improve as anticipated.   I provided 21+ minutes of time during this encounter.  Follow up plan: Return if symptoms worsen or fail to improve.

## 2020-09-08 ENCOUNTER — Other Ambulatory Visit: Payer: Medicaid Other

## 2020-09-08 ENCOUNTER — Ambulatory Visit
Admission: RE | Admit: 2020-09-08 | Discharge: 2020-09-08 | Disposition: A | Discharge status: Home / Self Care | Payer: Medicaid Other | Source: Ambulatory Visit | Attending: Emergency Medicine | Admitting: Emergency Medicine

## 2020-09-08 ENCOUNTER — Other Ambulatory Visit: Payer: Self-pay

## 2020-09-08 VITALS — BP 130/99 | HR 92 | Temp 99.1°F | Resp 18 | Ht 64.4 in | Wt 150.0 lb

## 2020-09-08 DIAGNOSIS — K120 Recurrent oral aphthae: Secondary | ICD-10-CM | POA: Diagnosis not present

## 2020-09-08 DIAGNOSIS — J029 Acute pharyngitis, unspecified: Secondary | ICD-10-CM

## 2020-09-08 MED ORDER — LIDOCAINE VISCOUS HCL 2 % MT SOLN: 15.0000 mL | OROMUCOSAL | 1 refills | Status: DC | PRN

## 2020-09-08 NOTE — ED Provider Notes (Signed)
[facility]    CSN: [id number] Arrival date & time: 09/08/20  1137      History   Chief Complaint Chief Complaint  Patient presents with  . Sore Throat    HPI Jennifer Watts is a 25 y.o. female.   HPI   25 year old female here for evaluation of sore throat.  Patient reports that she has been experiencing a sore throat for last 3 to 4 days.  She is only having pain on the left side with pain radiating into her left ear.  Patient reports that it is painful to chew, talk, or swallow.  Patient denies fever, runny nose, cough, shortness of breath, or GI symptoms.  Past Medical History:  Diagnosis Date  . Anxiety   . Depression   . Migraines   . MS (multiple sclerosis) (HCC)   . Neuromuscular disorder [facility])     Patient Active Problem List   Diagnosis Date Noted  . Sore throat 09/07/2020  . Dermatitis 07/11/2019  . Heart burn 01/27/2019  . Migraine without aura and without status migrainosus, not intractable 03/14/2017  . Nicotine dependence, cigarettes, uncomplicated 07/08/2015  . Cannabis use disorder, moderate, dependence (HCC) 07/08/2015  . Dysmenorrhea in adolescent 06/02/2015  . Multiple sclerosis (HCC) 10/26/2014  . Depression 02/27/2014  . Anxiety 01/20/2014  . Vitamin D deficiency 12/29/2013    Past Surgical History:  Procedure Laterality Date  . TYMPANOSTOMY TUBE PLACEMENT      OB History   No obstetric history on file.      Home Medications    Prior to Admission medications   Medication Sig Start Date End Date Taking? Authorizing Provider  ALPRAZolam (XANAX) 0.25 MG tablet TAKE 1 TABLET BY MOUTH THREE TIMES A DAY AS NEEDED 07/08/18  Yes [provider]  azithromycin (ZITHROMAX) 250 MG tablet Take 2 tablets by mouth (500 MG) on day 1 and then 1 tablet (250 MG) on days 2 to 5. 09/07/20  Yes Cannady, Jolene T, NP  cholecalciferol (VITAMIN D3) 25 MCG (1000 UT) tablet Take 1,000 Units by mouth daily.   Yes [provider]   clobetasol cream (TEMOVATE) 0.05 % Apply 1 application topically 2 (two) times daily. 07/15/19  Yes Cannady, Jolene T, NP  Cyanocobalamin (B-12 PO) Take 1 tablet by mouth daily.   Yes [provider]  famotidine (PEPCID) 20 MG tablet Take 1 tablet (20 mg total) by mouth 2 (two) times daily. 09/03/20  Yes Cannady, Jolene T, NP  lidocaine (XYLOCAINE) 2 % solution Use as directed 15 mLs in the mouth or throat as needed for mouth pain. 09/08/20  Yes Becky Augusta, NP  medroxyPROGESTERone (DEPO-PROVERA) 150 MG/ML injection INJECT 1 ML (150 MG TOTAL) INTO THE MUSCLE EVERY 3 (THREE) MONTHS. 03/08/20  Yes Cannady, Jolene T, NP  promethazine (PHENERGAN) 12.5 MG tablet Take 1 tablet (12.5 mg total) by mouth every 8 (eight) hours as needed for nausea or vomiting. 06/18/20  Yes Cannady, Jolene T, NP  sertraline (ZOLOFT) 100 MG tablet TAKE 1 TABLET BY MOUTH EVERY DAY 04/06/20  Yes Cannady, Jolene T, NP  SUMAtriptan (IMITREX) 100 MG tablet Take 50-100 mg by mouth daily as needed. 01/08/17  Yes [provider]  traZODone (DESYREL) 100 MG tablet TAKE 1 TABLET BY MOUTH EVERY DAY AT NIGHT 01/28/19  Yes [provider]    Family History Family History  Problem Relation Age of Onset  . Hypertension Maternal Grandmother   . Cancer Paternal Grandfather  throat  . Multiple sclerosis Paternal Grandfather   . Anxiety disorder Mother     Social History Social History   Tobacco Use  . Smoking status: Former Smoker    Packs/day: 0.25    Types: Cigarettes    Quit date: 01/09/2020    Years since quitting: 0.6  . Smokeless tobacco: Never Used  Vaping Use  . Vaping Use: Former  Substance Use Topics  . Alcohol use: Yes    Comment: social  . Drug use: No     Allergies   Reglan [metoclopramide], Ibuprofen, Penicillins, Sulfa antibiotics, and Sulfasalazine   Review of Systems Review of Systems  Constitutional: Negative for activity change, appetite change and fever.  HENT:  Positive for ear pain and sore throat. Negative for rhinorrhea.   Respiratory: Negative for cough.   Gastrointestinal: Negative for diarrhea, nausea and vomiting.  Skin: Negative for rash.  Hematological: Negative.   Psychiatric/Behavioral: Negative.      Physical Exam Triage Vital Signs ED Triage Vitals  Enc Vitals Group     BP 09/08/20 1239 (!) 130/99     Pulse Rate 09/08/20 1239 92     Resp 09/08/20 1239 18     Temp 09/08/20 1239 99.1 F (37.3 C)     Temp Source 09/08/20 1239 Oral     SpO2 09/08/20 1239 100 %     Weight 09/08/20 1237 150 lb (68 kg)     Height 09/08/20 1237 5' 4.4" (1.636 m)     Head Circumference --      Peak Flow --      Pain Score 09/08/20 1237 9     Pain Loc --      Pain Edu? --      Excl. in GC? --    No data found.  Updated Vital Signs BP (!) 130/99 (BP Location: Left Arm)   Pulse 92   Temp 99.1 F (37.3 C) (Oral)   Resp 18   Ht 5' 4.4" (1.636 m)   Wt 150 lb (68 kg)   SpO2 100%   BMI 25.43 kg/m   Visual Acuity Right Eye Distance:   Left Eye Distance:   Bilateral Distance:    Right Eye Near:   Left Eye Near:    Bilateral Near:     Physical Exam Vitals and nursing note reviewed.  Constitutional:      General: She is not in acute distress.    Appearance: She is well-developed and normal weight. She is not ill-appearing.  HENT:     Head: Normocephalic and atraumatic.     Right Ear: Tympanic membrane and ear canal normal. No middle ear effusion. Tympanic membrane is not erythematous.     Left Ear: Tympanic membrane and ear canal normal.  No middle ear effusion. Tympanic membrane is not erythematous.     Mouth/Throat:     Mouth: Mucous membranes are moist. Oral lesions present.     Pharynx: Posterior oropharyngeal erythema present.  Cardiovascular:     Rate and Rhythm: Normal rate and regular rhythm.     Heart sounds: Normal heart sounds. No murmur heard. No gallop.   Musculoskeletal:     Cervical back: Normal range of motion and  neck supple.  Skin:    General: Skin is warm and dry.     Capillary Refill: Capillary refill takes less than 2 seconds.     Findings: No erythema or rash.  Neurological:     General: No focal deficit present.  Mental Status: She is alert and oriented to person, place, and time.  Psychiatric:        Mood and Affect: Mood normal.        Behavior: Behavior normal.      UC Treatments / Results  Labs (all labs ordered are listed, but only abnormal results are displayed) Labs Reviewed - No data to display  EKG   Radiology No results found.  Procedures Procedures (including critical care time)  Medications Ordered in UC Medications - No data to display  Initial Impression / Assessment and Plan / UC Course  I have reviewed the triage vital signs and the nursing notes.  Pertinent labs & imaging results that were available during my care of the patient were reviewed by me and considered in my medical decision making (see chart for details).   Patient is a very pleasant 25 year old female who is nontoxic in appearance presenting for evaluation of a sore throat x3 to 4 days.  Patient was swabbed for strep by her PCP earlier today, and she has taken a home COVID test which was negative, and started on azithromycin.  She did take her first dose prior to arrival.  Patient symptoms are left-sided only with complaint of the pain radiating into her left ear and also that it hurts to talk, chewing, or swallowing.  Physical exam reveals bilateral pearly gray tympanic membranes with a normal light reflex and clear external auditory canals.  Oropharyngeal exam reveals a large aphthous ulcer on the left side of the soft palate extending along the left lateral aspect of the anterior oropharynx.  There is no tonsillar hypertrophy, erythema, or exudate.  Posterior oropharynx is unremarkable.  There is no cervical lymphadenopathy on exam.  Cardiopulmonary exam is benign.  Patient's exam is consistent  with an aphthous ulcer.  Patient denies any history of ulcers in the past and she also denies eating any acidic foods or any recent dental work or mouth trauma from eating hard crunchy foods.  Patient's mother does report that she drinks a lot of soda.  We will treat patient with viscous lidocaine, over-the-counter Tylenol and ibuprofen, salt water gargles, and have recommended patient start taking L-lysine 2000 mg 3 times a day to help prevent further outbreaks.   Final Clinical Impressions(s) / UC Diagnoses   Final diagnoses:  Oral aphthous ulcer     Discharge Instructions     Use the viscous lidocaine, 15 mL, 10 to 15 minutes before meals to provide throat pain relief.  Gargle with warm salt water 2-3 times a day to soothe the tissues, add in pain relief, and facilitate healing.  You can use over-the-counter Tylenol and ibuprofen as needed for pain.  Start taking L-lysine 2000 mg 3 times a day to prevent further outbreaks.    ED Prescriptions    Medication Sig Dispense Auth. Provider   lidocaine (XYLOCAINE) 2 % solution Use as directed 15 mLs in the mouth or throat as needed for mouth pain. 100 mL Becky Augusta, NP     PDMP not reviewed this encounter.   Becky Augusta, NP 09/08/20 1326

## 2020-09-08 NOTE — Discharge Instructions (Addendum)
Use the viscous lidocaine, 15 mL, 10 to 15 minutes before meals to provide throat pain relief.  Gargle with warm salt water 2-3 times a day to soothe the tissues, add in pain relief, and facilitate healing.  You can use over-the-counter Tylenol and ibuprofen as needed for pain.  Start taking L-lysine 2000 mg 3 times a day to prevent further outbreaks.

## 2020-09-08 NOTE — Progress Notes (Signed)
Please let Jennifer Watts know her strep test returned negative.:)

## 2020-09-08 NOTE — ED Triage Notes (Signed)
Patient states that she has been having a sore throat x 3-4 days. Patient is currently on Azithromycin and viscous lidocaine. States that she was swabbed for strep earlier today. Patient states that she is here today to have someone look at throat.

## 2020-09-10 ENCOUNTER — Other Ambulatory Visit: Payer: Self-pay | Admitting: Nurse Practitioner

## 2020-09-10 DIAGNOSIS — K120 Recurrent oral aphthae: Secondary | ICD-10-CM

## 2020-09-11 LAB — CULTURE, GROUP A STREP: Strep A Culture: NEGATIVE

## 2020-09-11 LAB — RAPID STREP SCREEN (MED CTR MEBANE ONLY): Strep Gp A Ag, IA W/Reflex: NEGATIVE

## 2020-09-29 ENCOUNTER — Encounter: Payer: Self-pay | Admitting: Nurse Practitioner

## 2020-09-29 ENCOUNTER — Ambulatory Visit (INDEPENDENT_AMBULATORY_CARE_PROVIDER_SITE_OTHER): Payer: Medicaid Other | Admitting: Nurse Practitioner

## 2020-09-29 ENCOUNTER — Other Ambulatory Visit: Payer: Self-pay

## 2020-09-29 VITALS — BP 116/84 | HR 88 | Temp 99.0°F | Wt 146.6 lb

## 2020-09-29 DIAGNOSIS — F330 Major depressive disorder, recurrent, mild: Secondary | ICD-10-CM

## 2020-09-29 DIAGNOSIS — Z3009 Encounter for other general counseling and advice on contraception: Secondary | ICD-10-CM | POA: Diagnosis not present

## 2020-09-29 DIAGNOSIS — F419 Anxiety disorder, unspecified: Secondary | ICD-10-CM | POA: Diagnosis not present

## 2020-09-29 NOTE — Assessment & Plan Note (Signed)
Chronic, stable.  Denies SI/HI.  Continue Trazodone, Nortriptyline, and Xanax as prescribed by neurology.  Have recommend for tapering off Sertraline to cut back to 1/2 tablet (50 MG) daily for one week and then slowly decrease to every other day dosing, then every third day, etc..  If any issues with reduction or increase anxiety to alert provider.  Return in 6 months for follow-up. 

## 2020-09-29 NOTE — Progress Notes (Signed)
BP 116/84   Pulse 88   Temp 99 F (37.2 C) (Oral)   Wt 146 lb 9.6 oz (66.5 kg)   SpO2 98%   BMI 24.85 kg/m    Subjective:    Patient ID: Jennifer Watts, female    DOB: [birth year], 25 y.o.   MRN: [id number]  HPI: Jennifer Watts is a 25 y.o. female  Chief Complaint  Patient presents with  . Contraception  . Anxiety  . Depression    Patient states she would like to discuss completely stopping her anti-depressant. Patient states she feels she is taking too much medication and it is the same as her current birth control (Depo-Provera)   DEPRESSION Currently taking Trazodone 100 MG at night, Sertraline 100 MG daily and Xanax 0.25 MG TID as needed as ordered by neurology. Would like to come off Sertraline -- continues Trazodone, Nortriptyline, and Xanax -- Dr. Malvin Johns prescribes these -- takes at least 3 Xanax.  Currently using Depo for birth control -- last injection not too long ago.  Does them at home.  Sees Dr. Dalbert Garnet and would like Nexplanon and to stop Depo.   Mood status: stable Satisfied with current treatment?: yes Symptom severity: mild  Duration of current treatment : chronic Side effects: no Medication compliance: good compliance Psychotherapy/counseling: in past Depressed mood: yes Anxious mood: yes Anhedonia: no Significant weight loss or gain: no Insomnia: yes hard to stay asleep Fatigue: occasional Feelings of worthlessness or guilt: no Impaired concentration/indecisiveness: no Suicidal ideations: no Hopelessness: no Crying spells: x 1 episode Depression screen [facility] 2/9 09/29/2020 06/18/2020 03/10/2020 12/24/2019 07/11/2019  Decreased Interest 0 0 1 1 0  Down, Depressed, Hopeless 0 0 1 1 0  PHQ - 2 Score 0 0 2 2 0  Altered sleeping 2 - 0 1 0  Tired, decreased energy 0 - 1 2 0  Change in appetite 0 - 0 0 2  Feeling bad or failure about yourself  0 - 0 1 0  Trouble concentrating 0 - 0 1 0  Moving slowly or fidgety/restless 0 - 1 0 0  Suicidal thoughts 0 -  0 0 0  PHQ-9 Score 2 - 4 7 2   Difficult doing work/chores - - Somewhat difficult Not difficult at all Not difficult at all   GAD 7 : Generalized Anxiety Score 09/29/2020 03/10/2020 12/24/2019 07/11/2019  Nervous, Anxious, on Edge 1 1 1 1   Control/stop worrying 0 0 1 0  Worry too much - different things 0 0 1 0  Trouble relaxing 1 1 1  0  Restless 0 0 0 0  Easily annoyed or irritable 0 0 1 0  Afraid - awful might happen 0 0 0 0  Total GAD 7 Score 2 2 5 1   Anxiety Difficulty Not difficult at all Not difficult at all Not difficult at all -   Relevant past medical, surgical, family and social history reviewed and updated as indicated. Interim medical history since our last visit reviewed. Allergies and medications reviewed and updated.  Review of Systems  Constitutional: Negative for activity change, appetite change, diaphoresis, fatigue and fever.  Respiratory: Negative for cough, chest tightness and shortness of breath.   Cardiovascular: Negative for chest pain, palpitations and leg swelling.  Gastrointestinal: Negative.   Psychiatric/Behavioral: Positive for sleep disturbance. Negative for decreased concentration, self-injury and suicidal ideas. The patient is nervous/anxious.     Per HPI unless specifically indicated above     Objective:    BP 116/84  Pulse 88   Temp 99 F (37.2 C) (Oral)   Wt 146 lb 9.6 oz (66.5 kg)   SpO2 98%   BMI 24.85 kg/m   Wt Readings from Last 3 Encounters:  09/29/20 146 lb 9.6 oz (66.5 kg)  09/08/20 150 lb (68 kg)  06/18/20 150 lb (68 kg)    Physical Exam Vitals and nursing note reviewed.  Constitutional:      General: She is awake. She is not in acute distress.    Appearance: She is well-developed and well-groomed. She is not ill-appearing.  HENT:     Head: Normocephalic.     Right Ear: Hearing normal.     Left Ear: Hearing normal.  Eyes:     General: Lids are normal.        Right eye: No discharge.        Left eye: No discharge.      Conjunctiva/sclera: Conjunctivae normal.     Pupils: Pupils are equal, round, and reactive to light.  Neck:     Thyroid: No thyromegaly.     Vascular: No carotid bruit.  Cardiovascular:     Rate and Rhythm: Normal rate and regular rhythm.     Heart sounds: Normal heart sounds. No murmur heard. No gallop.   Pulmonary:     Effort: Pulmonary effort is normal. No accessory muscle usage or respiratory distress.     Breath sounds: Normal breath sounds.  Abdominal:     General: Bowel sounds are normal.     Palpations: Abdomen is soft.     Tenderness: There is no abdominal tenderness.  Musculoskeletal:     Cervical back: Normal range of motion and neck supple.     Right lower leg: No edema.     Left lower leg: No edema.  Skin:    General: Skin is warm and dry.  Neurological:     Mental Status: She is alert and oriented to person, place, and time.  Psychiatric:        Attention and Perception: Attention normal.        Mood and Affect: Mood normal.        Speech: Speech normal.        Behavior: Behavior normal. Behavior is cooperative.        Thought Content: Thought content normal.    Results for orders placed or performed in visit on 09/08/20  Rapid Strep Screen ([facility] ONLY)   Specimen: Other   Other  Result Value Ref Range   Strep Gp A Ag, IA W/Reflex Negative Negative  Culture, Group A Strep   Other  Result Value Ref Range   Strep A Culture Negative       Assessment & Plan:   Problem List Items Addressed This Visit      Other   Depression - Primary    Chronic, stable.  Denies SI/HI.  Continue Trazodone, Nortriptyline, and Xanax as prescribed by neurology.  Have recommend for tapering off Sertraline to cut back to 1/2 tablet (50 MG) daily for one week and then slowly decrease to every other day dosing, then every third day, etc..  If any issues with reduction or increase anxiety to alert provider.  Return in 6 months for follow-up.      Relevant Medications    nortriptyline (PAMELOR) 10 MG capsule   Anxiety    Chronic, stable.  Denies SI/HI.  Continue Trazodone, Nortriptyline, and Xanax as prescribed by neurology.  Have recommend for tapering off  Sertraline to cut back to 1/2 tablet (50 MG) daily for one week and then slowly decrease to every other day dosing, then every third day, etc..  If any issues with reduction or increase anxiety to alert provider.  Return in 6 months for follow-up.      Relevant Medications   nortriptyline (PAMELOR) 10 MG capsule   Birth control counseling    She would like to stop Depo and have Nexplanon placed, is established with [facility], Dr. Dalbert Garnet and last saw February 2021 -- recommend she reach to their practice to schedule appointment for placement and educated her on Nexplanon.           Follow up plan: Return in about 6 months (around 04/01/2021) for Annual physical.

## 2020-09-29 NOTE — Assessment & Plan Note (Signed)
Chronic, stable.  Denies SI/HI.  Continue Trazodone, Nortriptyline, and Xanax as prescribed by neurology.  Have recommend for tapering off Sertraline to cut back to 1/2 tablet (50 MG) daily for one week and then slowly decrease to every other day dosing, then every third day, etc..  If any issues with reduction or increase anxiety to alert provider.  Return in 6 months for follow-up.

## 2020-09-29 NOTE — Assessment & Plan Note (Signed)
She would like to stop Depo and have Nexplanon placed, is established with [facility], Dr. Dalbert Garnet and last saw February 2021 -- recommend she reach to their practice to schedule appointment for placement and educated her on Nexplanon.

## 2020-09-29 NOTE — Patient Instructions (Signed)
Etonogestrel implant What is this medicine? ETONOGESTREL (et oh noe JES trel) is a contraceptive (birth control) device. It is used to prevent pregnancy. It can be used for up to 3 years. This medicine may be used for other purposes; ask your health care provider or pharmacist if you have questions. COMMON BRAND NAME(S): Implanon, Nexplanon What should I tell my health care provider before I take this medicine? They need to know if you have any of these conditions:  abnormal vaginal bleeding  blood vessel disease or blood clots  breast, cervical, endometrial, ovarian, liver, or uterine cancer  diabetes  gallbladder disease  heart disease or recent heart attack  high blood pressure  high cholesterol or triglycerides  kidney disease  liver disease  migraine headaches  seizures  stroke  tobacco smoker  an unusual or allergic reaction to etonogestrel, anesthetics or antiseptics, other medicines, foods, dyes, or preservatives  pregnant or trying to get pregnant  breast-feeding How should I use this medicine? This device is inserted just under the skin on the inner side of your upper arm by a health care professional. Talk to your pediatrician regarding the use of this medicine in children. Special care may be needed. Overdosage: If you think you have taken too much of this medicine contact a poison control center or emergency room at once. NOTE: This medicine is only for you. Do not share this medicine with others. What if I miss a dose? This does not apply. What may interact with this medicine? Do not take this medicine with any of the following medications:  amprenavir  fosamprenavir This medicine may also interact with the following medications:  acitretin  aprepitant  armodafinil  bexarotene  bosentan  carbamazepine  certain medicines for fungal infections like fluconazole, ketoconazole, itraconazole and voriconazole  certain medicines to treat  hepatitis, HIV or AIDS  cyclosporine  felbamate  griseofulvin  lamotrigine  modafinil  oxcarbazepine  phenobarbital  phenytoin  primidone  rifabutin  rifampin  rifapentine  St. John's wort  topiramate This list may not describe all possible interactions. Give your health care provider a list of all the medicines, herbs, non-prescription drugs, or dietary supplements you use. Also tell them if you smoke, drink alcohol, or use illegal drugs. Some items may interact with your medicine. What should I watch for while using this medicine? This product does not protect you against HIV infection (AIDS) or other sexually transmitted diseases. You should be able to feel the implant by pressing your fingertips over the skin where it was inserted. Contact your doctor if you cannot feel the implant, and use a non-hormonal birth control method (such as condoms) until your doctor confirms that the implant is in place. Contact your doctor if you think that the implant may have broken or become bent while in your arm. You will receive a user card from your health care provider after the implant is inserted. The card is a record of the location of the implant in your upper arm and when it should be removed. Keep this card with your health records. What side effects may I notice from receiving this medicine? Side effects that you should report to your doctor or health care professional as soon as possible:  allergic reactions like skin rash, itching or hives, swelling of the face, lips, or tongue  breast lumps, breast tissue changes, or discharge  breathing problems  changes in emotions or moods  coughing up blood  if you feel that the implant   may have broken or bent while in your arm  high blood pressure  pain, irritation, swelling, or bruising at the insertion site  scar at site of insertion  signs of infection at the insertion site such as fever, and skin redness, pain or  discharge  signs and symptoms of a blood clot such as breathing problems; changes in vision; chest pain; severe, sudden headache; pain, swelling, warmth in the leg; trouble speaking; sudden numbness or weakness of the face, arm or leg  signs and symptoms of liver injury like dark yellow or brown urine; general ill feeling or flu-like symptoms; light-colored stools; loss of appetite; nausea; right upper belly pain; unusually weak or tired; yellowing of the eyes or skin  unusual vaginal bleeding, discharge Side effects that usually do not require medical attention (report to your doctor or health care professional if they continue or are bothersome):  acne  breast pain or tenderness  headache  irregular menstrual bleeding  nausea This list may not describe all possible side effects. Call your doctor for medical advice about side effects. You may report side effects to FDA at 1-800-FDA-1088. Where should I keep my medicine? This drug is given in a hospital or clinic and will not be stored at home. NOTE: This sheet is a summary. It may not cover all possible information. If you have questions about this medicine, talk to your doctor, pharmacist, or health care provider.  2021 Elsevier/Gold Standard (2019-02-11 11:33:04)  

## 2020-11-09 ENCOUNTER — Other Ambulatory Visit: Payer: Self-pay

## 2020-11-09 ENCOUNTER — Ambulatory Visit (INDEPENDENT_AMBULATORY_CARE_PROVIDER_SITE_OTHER): Payer: Medicaid Other | Admitting: Gastroenterology

## 2020-11-09 ENCOUNTER — Encounter: Payer: Self-pay | Admitting: Gastroenterology

## 2020-11-09 VITALS — BP 145/93 | HR 120 | Temp 98.3°F | Ht 64.49 in | Wt 147.8 lb

## 2020-11-09 DIAGNOSIS — R12 Heartburn: Secondary | ICD-10-CM

## 2020-11-09 DIAGNOSIS — R112 Nausea with vomiting, unspecified: Secondary | ICD-10-CM

## 2020-11-09 DIAGNOSIS — K5900 Constipation, unspecified: Secondary | ICD-10-CM | POA: Diagnosis not present

## 2020-11-09 MED ORDER — OMEPRAZOLE 40 MG PO CPDR: 40.0000 mg | DELAYED_RELEASE_CAPSULE | Freq: Every day | ORAL | 1 refills | Status: DC

## 2020-11-09 NOTE — Patient Instructions (Addendum)
Please take IBgard daily for the next 30 days. Please take Prilosec 40 MG daily for the next 30 days. Please take Linzess 290 mcg daily for the next 30 days.  High-Fiber Eating Plan Fiber, also called dietary fiber, is a type of carbohydrate. It is found foods such as fruits, vegetables, whole grains, and beans. A high-fiber diet can have many health benefits. Your health care provider may recommend a high-fiber diet to help: Prevent constipation. Fiber can make your bowel movements more regular. Lower your cholesterol. Relieve the following conditions: Inflammation of veins in the anus (hemorrhoids). Inflammation of specific areas of the digestive tract (uncomplicated diverticulosis). A problem of the large intestine, also called the colon, that sometimes causes pain and diarrhea (irritable bowel syndrome, or IBS). Prevent overeating as part of a weight-loss plan. Prevent heart disease, type 2 diabetes, and certain cancers. What are tips for following this plan? Reading food labels  Check the nutrition facts label on food products for the amount of dietary fiber. Choose foods that have 5 grams of fiber or more per serving. The goals for recommended daily fiber intake include: Men (age 75 or younger): 34-38 g. Men (over age 3): 28-34 g. Women (age 63 or younger): 25-28 g. Women (over age 42): 22-25 g. Your daily fiber goal is _____________ g. Shopping Choose whole fruits and vegetables instead of processed forms, such as apple juice or applesauce. Choose a wide variety of high-fiber foods such as avocados, lentils, oats, and kidney beans. Read the nutrition facts label of the foods you choose. Be aware of foods with added fiber. These foods often have high sugar and sodium amounts per serving. Cooking Use whole-grain flour for baking and cooking. Cook with brown rice instead of white rice. Meal planning Start the day with a breakfast that is high in fiber, such as a cereal that  contains 5 g of fiber or more per serving. Eat breads and cereals that are made with whole-grain flour instead of refined flour or white flour. Eat brown rice, bulgur wheat, or millet instead of white rice. Use beans in place of meat in soups, salads, and pasta dishes. Be sure that half of the grains you eat each day are whole grains. General information You can get the recommended daily intake of dietary fiber by: Eating a variety of fruits, vegetables, grains, nuts, and beans. Taking a fiber supplement if you are not able to take in enough fiber in your diet. It is better to get fiber through food than from a supplement. Gradually increase how much fiber you consume. If you increase your intake of dietary fiber too quickly, you may have bloating, cramping, or gas. Drink plenty of water to help you digest fiber. Choose high-fiber snacks, such as berries, raw vegetables, nuts, and popcorn. What foods should I eat? Fruits Berries. Pears. Apples. Oranges. Avocado. Prunes and raisins. Dried figs. Vegetables Sweet potatoes. Spinach. Kale. Artichokes. Cabbage. Broccoli. Cauliflower.Green peas. Carrots. Squash. Grains Whole-grain breads. Multigrain cereal. Oats and oatmeal. Brown rice. Barley.Bulgur wheat. Millet. Quinoa. Bran muffins. Popcorn. Rye wafer crackers. Meats and other proteins Navy beans, kidney beans, and pinto beans. Soybeans. Split peas. Lentils. Nutsand seeds. Dairy Fiber-fortified yogurt. Beverages Fiber-fortified soy milk. Fiber-fortified orange juice. Other foods Fiber bars. The items listed above may not be a complete list of recommended foods and beverages. Contact a dietitian for more information. What foods should I avoid? Fruits Fruit juice. Cooked, strained fruit. Vegetables Fried potatoes. Canned vegetables. Well-cooked vegetables. Grains White bread.  Pasta made with refined flour. White rice. Meats and other proteins Fatty cuts of meat. Fried chicken or fried  fish. Dairy Milk. Yogurt. Cream cheese. Sour cream. Fats and oils Butters. Beverages Soft drinks. Other foods Cakes and pastries. The items listed above may not be a complete list of foods and beverages to avoid. Talk with your dietitian about what choices are best for you. Summary Fiber is a type of carbohydrate. It is found in foods such as fruits, vegetables, whole grains, and beans. A high-fiber diet has many benefits. It can help to prevent constipation, lower blood cholesterol, aid weight loss, and reduce your risk of heart disease, diabetes, and certain cancers. Increase your intake of fiber gradually. Increasing fiber too quickly may cause cramping, bloating, and gas. Drink plenty of water while you increase the amount of fiber you consume. The best sources of fiber include whole fruits and vegetables, whole grains, nuts, seeds, and beans. This information is not intended to replace advice given to you by your health care provider. Make sure you discuss any questions you have with your healthcare provider. Document Revised: 09/04/2019 Document Reviewed: 09/04/2019 Elsevier Patient Education  2022 [employer].

## 2020-11-09 NOTE — Progress Notes (Signed)
[name] MD, MRCP(U.K) [address]  [address]  [place], [place] [address]  Main: [phone]  Fax: [phone]   Gastroenterology Consultation  Referring Provider:     Marjie Skiff, NP Primary Care Physician:  Marjie Skiff, NP Primary Gastroenterologist:  Dr. [name]  Reason for Consultation:    She has been referred for GERD        HPI:   Jennifer Watts is a 25 y.o. y/o female referred for consultation & management  by Marjie Skiff, NP.     She was last seen my office over 3 years back in January 2019 for bloating and abdominal distention.  She had prior ER visits for nausea vomiting which had been going on for several months.  Imaging of the abdomen in 2019 had features suggestive of constipation.  Previously been seen and evaluated by [facility] gastroenterology in 2018.  Has a history of multiple sclerosis and B12 deficiency.  Prior evaluation suggest that she had been exposed to cannabinoids.  She did have a history consistent with cannabinoid hyperemesis syndrome.  I have started her on Linzess at that point of time.  Plan was to perform an EGD and colonoscopy she canceled the procedures.    No recent labs.  MRI of the brain in March 2022 showed some mild white matter changes consistent with diagnosis of multiple sclerosis.   She returns to my office today along with her mother.  She states that after her last visit in 2018 symptoms resolved and the Linzess worked very well then she eventually went off of it.  Over the last few months she has had issues with severe constipation not having a bowel movement more than once or twice a week which is very hard almost like rocks, diet is very poor in fiber, associated bloating abdominal distention cramping nausea heartburn.  Stress makes heartburn worse.  He is on famotidine no significant improvement.  IBgard which she takes from her mother has helped.  Past Medical History:  Diagnosis Date    Anxiety    Depression    Migraines    MS (multiple sclerosis) (HCC)    Neuromuscular disorder (HCC)     Past Surgical History:  Procedure Laterality Date   TYMPANOSTOMY TUBE PLACEMENT      Prior to Admission medications   Medication Sig Start Date End Date Taking? Authorizing Provider  ALPRAZolam (XANAX) 0.25 MG tablet TAKE 1 TABLET BY MOUTH THREE TIMES A DAY AS NEEDED 07/08/18   [provider]  cholecalciferol (VITAMIN D3) 25 MCG (1000 UT) tablet Take 1,000 Units by mouth daily.    [provider]  clobetasol cream (TEMOVATE) 0.05 % Apply 1 application topically 2 (two) times daily. 07/15/19   Cannady, Corrie Dandy T, NP  Cyanocobalamin (B-12 PO) Take 1 tablet by mouth daily.    [provider]  famotidine (PEPCID) 20 MG tablet Take 1 tablet (20 mg total) by mouth 2 (two) times daily. 09/03/20   Cannady, Corrie Dandy T, NP  medroxyPROGESTERone (DEPO-PROVERA) 150 MG/ML injection INJECT 1 ML (150 MG TOTAL) INTO THE MUSCLE EVERY 3 (THREE) MONTHS. 03/08/20   Cannady, Corrie Dandy T, NP  nortriptyline (PAMELOR) 10 MG capsule Take 10 mg by mouth at bedtime.    [provider]  sertraline (ZOLOFT) 50 MG tablet Take 50 mg by mouth daily. 10/22/20   [provider]  SUMAtriptan (IMITREX) 100 MG tablet Take 50-100 mg by mouth daily as needed. 01/08/17  [provider]  traZODone (DESYREL) 100 MG tablet TAKE 1 TABLET BY MOUTH EVERY DAY AT NIGHT 01/28/19   [provider]    Family History  Problem Relation Age of Onset   Hypertension Maternal Grandmother    Cancer Paternal Grandfather        throat   Multiple sclerosis Paternal Grandfather    Anxiety disorder Mother      Social History   Tobacco Use   Smoking status: Former    Packs/day: 0.25    Pack years: 0.00    Types: Cigarettes    Quit date: 01/09/2020    Years since quitting: 0.8   Smokeless tobacco: Never  Vaping Use   Vaping Use: Former  Substance Use Topics   Alcohol use: Yes     Comment: social   Drug use: No    Allergies as of 11/09/2020 - Review Complete 11/09/2020  Allergen Reaction Noted   Reglan [metoclopramide] Other (See Comments) 03/28/2017   Ibuprofen Rash 09/23/2014   Penicillins Rash 09/23/2014   Sulfa antibiotics Rash 09/23/2014   Sulfasalazine Rash 09/23/2014    Review of Systems:    All systems reviewed and negative except where noted in HPI.   Physical Exam:  BP (!) 145/93   Pulse (!) 120   Temp 98.3 F (36.8 C) (Oral)   Ht 5' 4.49" (1.638 m)   Wt 147 lb 12.8 oz (67 kg)   BMI 24.99 kg/m  No LMP recorded. Patient has had an injection. Psych:  Alert and cooperative. Normal mood and affect. General:   Alert,  Well-developed, well-nourished, pleasant and cooperative in NAD Head:  Normocephalic and atraumatic. Eyes:  Sclera clear, no icterus.   Conjunctiva pink. Ears:  Normal auditory acuity. Lungs:  Respirations even and unlabored.  Clear throughout to auscultation.   No wheezes, crackles, or rhonchi. No acute distress. Heart:  Regular rate and rhythm; no murmurs, clicks, rubs, or gallops. Abdomen:  Normal bowel sounds.  No bruits.  Soft, non-tender and non-distended without masses, hepatosplenomegaly or hernias noted.  No guarding or rebound tenderness.    Neurologic:  Alert and oriented x3;  grossly normal neurologically. Psych:  Alert and cooperative. Normal mood and affect.  Imaging Studies: No results found.  Assessment and Plan:   Jennifer Watts is a 25 y.o. y/o female has been referred for heartburn.  She has been seen by office in 2019 and her symptoms resolved to the point of time and now she returns.  She has a history of multiple sclerosis.  Based on her history it is very likely a lot of her symptoms are related to chronic constipation, a low fiber diet definitely has not helped.  She also probably has an element of gastroparesis and associated acid reflux.  Plan 1.  Discussed about a high-fiber diet with a target of 25  g/day can use Metamucil as a supplement but not a replacement.  Patient information on high-fiber diet provided  2.  Stop famotidine commence on Prilosec 40 mg once a day in the morning.  3.  IBgard trial samples provided  4.  Constipation commenced on Linzess to 90 mcg  5.  TSH ordered to rule out hypothyroidism as a cause for constipation and gastroparesis Follow up in 6 weeks video visit  Dr [name] MD,MRCP(U.K)

## 2020-11-10 LAB — TSH: TSH: 2.18 u[IU]/mL (ref 0.450–4.500)

## 2020-12-01 ENCOUNTER — Other Ambulatory Visit: Payer: Self-pay | Admitting: Gastroenterology

## 2020-12-06 ENCOUNTER — Other Ambulatory Visit: Payer: Self-pay

## 2020-12-06 MED ORDER — OMEPRAZOLE 40 MG PO CPDR: 40.0000 mg | DELAYED_RELEASE_CAPSULE | Freq: Every day | ORAL | 1 refills | Status: DC

## 2020-12-21 ENCOUNTER — Telehealth: Payer: Medicaid Other | Admitting: Gastroenterology

## 2020-12-29 ENCOUNTER — Telehealth (INDEPENDENT_AMBULATORY_CARE_PROVIDER_SITE_OTHER): Payer: Self-pay | Admitting: Gastroenterology

## 2020-12-29 DIAGNOSIS — Z5329 Procedure and treatment not carried out because of patient's decision for other reasons: Secondary | ICD-10-CM

## 2020-12-29 DIAGNOSIS — Z91199 Patient's noncompliance with other medical treatment and regimen due to unspecified reason: Secondary | ICD-10-CM

## 2020-12-29 NOTE — Progress Notes (Signed)
No show

## 2020-12-30 ENCOUNTER — Other Ambulatory Visit: Payer: Self-pay | Admitting: Gastroenterology

## 2021-01-04 ENCOUNTER — Encounter: Payer: Self-pay | Admitting: Gastroenterology

## 2021-01-04 ENCOUNTER — Telehealth (INDEPENDENT_AMBULATORY_CARE_PROVIDER_SITE_OTHER): Payer: Medicaid Other | Admitting: Gastroenterology

## 2021-01-04 DIAGNOSIS — K5900 Constipation, unspecified: Secondary | ICD-10-CM

## 2021-01-04 DIAGNOSIS — R12 Heartburn: Secondary | ICD-10-CM

## 2021-01-04 NOTE — Progress Notes (Signed)
[name] , MD [address]  [address]  [place], [place] [address]  Main: [phone]  Fax: [phone]   Primary Care Physician: Marjie Skiff, NP  Virtual Visit via Video Note  I connected with patient on 01/04/21 at  2:15 PM EDT by video and verified that I am speaking with the correct person using two identifiers.   I discussed the limitations, risks, security and privacy concerns of performing an evaluation and management service by video  and the availability of in person appointments. I also discussed with the patient that there may be a patient responsible charge related to this service. The patient expressed understanding and agreed to proceed.  Location of Patient: Home Location of Provider: Home Persons involved: Patient and provider only   History of Present Illness: Chief Complaint  Patient presents with   Constipation    HPI: Jennifer Watts is a 25 y.o. female     Previously seen in January 2019 for bloating and abdominal distention, nausea vomiting and imaging at that time had shown features of constipation.  Prior evaluation at [facility] clinic in 2018.Has a history of multiple sclerosis and B12 deficiency.  Prior evaluation suggest that she had been exposed to cannabinoids.  She did have a history consistent with cannabinoid hyperemesis syndrome.  I have started her on Linzess at that point of time.  Plan was to perform an EGD and colonoscopy she canceled the procedures.   Last visit was on 11/09/2020 and she said that her symptoms resolved and the Linzess worked eventually but then stopped and had recurrence of constipation with bloating abdominal distention and heartburn.  Had been on famotidine with no improvement and IBgard which she took from her mother had helped.   After changing over to Prilosec all her symptoms of acid reflux have resolved.  Took Linzess 2 9 0 mcg daily.  Caused her to have good bowel movements but a lot of vomiting.   Start taking it as needed and now has regular bowel movements.  No complaints..  Current Outpatient Medications  Medication Sig Dispense Refill   ALPRAZolam (XANAX) 0.25 MG tablet TAKE 1 TABLET BY MOUTH THREE TIMES A DAY AS NEEDED     cholecalciferol (VITAMIN D3) 25 MCG (1000 UT) tablet Take 1,000 Units by mouth daily.     clobetasol cream (TEMOVATE) 0.05 % Apply 1 application topically 2 (two) times daily. 30 g 2   Cyanocobalamin (B-12 PO) Take 1 tablet by mouth daily.     Erenumab-aooe (AIMOVIG) 140 MG/ML SOAJ Inject 140 mg into the skin every 30 (thirty) days.     famotidine (PEPCID) 20 MG tablet Take 20 mg by mouth 2 (two) times daily.     medroxyPROGESTERone (DEPO-PROVERA) 150 MG/ML injection INJECT 1 ML (150 MG TOTAL) INTO THE MUSCLE EVERY 3 (THREE) MONTHS. 1 mL 4   nortriptyline (PAMELOR) 10 MG capsule Take 10 mg by mouth at bedtime.     omeprazole (PRILOSEC) 40 MG capsule TAKE 1 CAPSULE (40 MG TOTAL) BY MOUTH DAILY. 30 capsule 0   sertraline (ZOLOFT) 50 MG tablet Take 50 mg by mouth daily.     SUMAtriptan (IMITREX) 100 MG tablet Take 50-100 mg by mouth daily as needed.     traZODone (DESYREL) 100 MG tablet TAKE 1 TABLET BY MOUTH EVERY DAY AT NIGHT     No current facility-administered medications for this visit.   Facility-Administered Medications Ordered in Other Visits  Medication Dose Route Frequency Provider Last  Rate Last Admin   acetaminophen (TYLENOL) tablet 1,000 mg  1,000 mg Oral Once Jeralyn Ruths, MD       diphenhydrAMINE (BENADRYL) capsule 50 mg  50 mg Oral Once Jeralyn Ruths, MD       methylPREDNISolone sodium succinate (SOLU-MEDROL) 125 mg/2 mL injection 100 mg  100 mg Intravenous Once Jeralyn Ruths, MD       riTUXimab (RITUXAN) 1,000 mg in sodium chloride 0.9 % 150 mL chemo infusion  1,000 mg Intravenous Once Jeralyn Ruths, MD        Allergies as of 01/04/2021 - Review Complete 01/04/2021  Allergen Reaction Noted   Reglan [metoclopramide]  Other (See Comments) 03/28/2017   Ibuprofen Rash 09/23/2014   Penicillins Rash 09/23/2014   Sulfa antibiotics Rash 09/23/2014   Sulfasalazine Rash 09/23/2014    Review of Systems:    All systems reviewed and negative except where noted in HPI.  General Appearance:    Alert, cooperative, no distress, appears stated age  Head:    Normocephalic, without obvious abnormality, atraumatic  Eyes:    PERRL, conjunctiva/corneas clear,  Ears:    Grossly normal hearing    Neurologic:  Grossly normal    Observations/Objective:  Labs: CMP     Component Value Date/Time   NA 139 04/26/2020 2108   NA 138 01/03/2019 1546   NA 140 05/09/2014 1742   K 3.6 04/26/2020 2108   K 3.8 05/09/2014 1742   CL 106 04/26/2020 2108   CL 109 (H) 05/09/2014 1742   CO2 21 (L) 04/26/2020 2108   CO2 22 05/09/2014 1742   GLUCOSE 97 04/26/2020 2108   GLUCOSE 89 05/09/2014 1742   BUN 14 04/26/2020 2108   BUN 14 01/03/2019 1546   BUN 17 05/09/2014 1742   CREATININE 0.80 04/26/2020 2108   CREATININE 0.72 05/09/2014 1742   CALCIUM 9.5 04/26/2020 2108   CALCIUM 9.1 05/09/2014 1742   PROT 7.0 04/26/2020 2108   PROT 6.5 01/03/2019 1546   PROT 7.7 05/09/2014 1742   ALBUMIN 4.3 04/26/2020 2108   ALBUMIN 4.7 01/03/2019 1546   ALBUMIN 4.6 05/09/2014 1742   AST 16 04/26/2020 2108   AST 17 05/09/2014 1742   ALT 14 04/26/2020 2108   ALT 24 05/09/2014 1742   ALKPHOS 56 04/26/2020 2108   ALKPHOS 56 05/09/2014 1742   BILITOT 0.9 04/26/2020 2108   BILITOT 0.4 01/03/2019 1546   BILITOT 0.5 05/09/2014 1742   GFRNONAA >60 04/26/2020 2108   GFRAA 136 01/03/2019 1546   Lab Results  Component Value Date   WBC 10.6 (H) 04/26/2020   HGB 14.4 04/26/2020   HCT 42.0 04/26/2020   MCV 92.1 04/26/2020   PLT 332 04/26/2020    Imaging Studies: No results found.  Assessment and Plan:   Jennifer Watts is a 25 y.o. y/o female here to follow for constipation which has been stable on Linzess 2 9 0 mcg a day.  She takes  it as needed which works better for her.  She is also having stable acid reflux on 40 mg of omeprazole a day.  She did not respond to famotidine.  She can follow-up with Aura Dials T, NP and would suggest in a few months time to try and reduce the dose of omeprazole to 20 mg a day if possible.        I discussed the assessment and treatment plan with the patient. The patient was provided an opportunity to ask questions and all  were answered. The patient agreed with the plan and demonstrated an understanding of the instructions.   The patient was advised to call back or seek an in-person evaluation if the symptoms worsen or if the condition fails to improve as anticipated.  I provided 15 minutes of face-to-face time during this encounter.  Dr [name] MD,MRCP [facility]) Gastroenterology/Hepatology Pager: [phone]   Speech recognition software was used to dictate this note.

## 2021-01-13 ENCOUNTER — Other Ambulatory Visit: Payer: Self-pay | Admitting: Nurse Practitioner

## 2021-01-13 NOTE — Telephone Encounter (Signed)
Patient last seen in May and has appointment in November.

## 2021-01-13 NOTE — Telephone Encounter (Signed)
Requested medication (s) are due for refill today: historical medication  Requested medication (s) are on the active medication list: yes   Last refill:  10/22/20  Future visit scheduled: yes in 2 months   Notes to clinic:  historical medication. Do you want to order?     Requested Prescriptions  Pending Prescriptions Disp Refills   sertraline (ZOLOFT) 50 MG tablet [Pharmacy Med Name: SERTRALINE HCL 50 MG TABLET] 90 tablet 3    Sig: TAKE 1 TABLET BY MOUTH EVERY DAY     Psychiatry:  Antidepressants - SSRI Passed - 01/13/2021 12:18 PM      Passed - Completed PHQ-2 or PHQ-9 in the last 360 days      Passed - Valid encounter within last 6 months    Recent Outpatient Visits           3 months ago Mild episode of recurrent major depressive disorder (HCC)   [facility] Cannady, Jolene T, NP   4 months ago Sore throat   [facility] [place], Dou[place] Ana T, NP   6 months ago Migraine without aura and without status migrainosus, not intractable   [facility] [place], Jolene T, NP   10 months ago Mild episode of recurrent major depressive disorder (HCC)   [facility] Cannady, Dorie Rank, NP   1 year ago Vaginal discharge   [facility] [place], Dorie Rank, NP       Future Appointments             In 2 months Cannady, Dorie Rank, NP [facility]

## 2021-01-27 ENCOUNTER — Other Ambulatory Visit: Payer: Self-pay | Admitting: Gastroenterology

## 2021-01-31 ENCOUNTER — Ambulatory Visit (INDEPENDENT_AMBULATORY_CARE_PROVIDER_SITE_OTHER): Payer: Medicaid Other

## 2021-01-31 ENCOUNTER — Ambulatory Visit: Payer: Medicaid Other

## 2021-01-31 ENCOUNTER — Other Ambulatory Visit: Payer: Self-pay

## 2021-01-31 DIAGNOSIS — Z23 Encounter for immunization: Secondary | ICD-10-CM

## 2021-01-31 DIAGNOSIS — W5501XA Bitten by cat, initial encounter: Secondary | ICD-10-CM | POA: Diagnosis not present

## 2021-02-20 ENCOUNTER — Other Ambulatory Visit: Payer: Self-pay | Admitting: Gastroenterology

## 2021-02-21 ENCOUNTER — Other Ambulatory Visit: Payer: Self-pay

## 2021-02-21 MED ORDER — OMEPRAZOLE 20 MG PO CPDR: 20.0000 mg | DELAYED_RELEASE_CAPSULE | Freq: Every day | ORAL | 0 refills | Status: DC

## 2021-03-14 ENCOUNTER — Other Ambulatory Visit: Payer: Self-pay | Admitting: Gastroenterology

## 2021-03-20 ENCOUNTER — Other Ambulatory Visit: Payer: Self-pay | Admitting: Gastroenterology

## 2021-03-25 ENCOUNTER — Other Ambulatory Visit: Payer: Self-pay

## 2021-03-25 MED ORDER — LINACLOTIDE 290 MCG PO CAPS: 290.0000 ug | ORAL_CAPSULE | Freq: Every day | ORAL | 3 refills | Status: DC

## 2021-03-25 MED ORDER — OMEPRAZOLE 20 MG PO CPDR: 20.0000 mg | DELAYED_RELEASE_CAPSULE | Freq: Every day | ORAL | 11 refills | Status: DC

## 2021-03-25 NOTE — Telephone Encounter (Signed)
yes

## 2021-04-04 ENCOUNTER — Encounter: Payer: Medicaid Other | Admitting: Nurse Practitioner

## 2021-04-11 ENCOUNTER — Other Ambulatory Visit: Payer: Self-pay | Admitting: Gastroenterology

## 2021-04-13 ENCOUNTER — Other Ambulatory Visit: Payer: Self-pay

## 2021-04-13 ENCOUNTER — Encounter: Payer: Self-pay | Admitting: Nurse Practitioner

## 2021-04-13 ENCOUNTER — Ambulatory Visit (INDEPENDENT_AMBULATORY_CARE_PROVIDER_SITE_OTHER): Payer: Medicaid Other | Admitting: Nurse Practitioner

## 2021-04-13 VITALS — BP 137/80 | HR 90 | Temp 98.7°F | Ht 64.5 in | Wt 149.8 lb

## 2021-04-13 DIAGNOSIS — G35 Multiple sclerosis: Secondary | ICD-10-CM | POA: Diagnosis not present

## 2021-04-13 DIAGNOSIS — Z789 Other specified health status: Secondary | ICD-10-CM | POA: Insufficient documentation

## 2021-04-13 DIAGNOSIS — E538 Deficiency of other specified B group vitamins: Secondary | ICD-10-CM

## 2021-04-13 DIAGNOSIS — F330 Major depressive disorder, recurrent, mild: Secondary | ICD-10-CM

## 2021-04-13 DIAGNOSIS — E559 Vitamin D deficiency, unspecified: Secondary | ICD-10-CM

## 2021-04-13 DIAGNOSIS — Z13220 Encounter for screening for lipoid disorders: Secondary | ICD-10-CM

## 2021-04-13 DIAGNOSIS — Z0000 Encounter for general adult medical examination without abnormal findings: Secondary | ICD-10-CM

## 2021-04-13 DIAGNOSIS — G43009 Migraine without aura, not intractable, without status migrainosus: Secondary | ICD-10-CM

## 2021-04-13 DIAGNOSIS — F419 Anxiety disorder, unspecified: Secondary | ICD-10-CM

## 2021-04-13 DIAGNOSIS — Z136 Encounter for screening for cardiovascular disorders: Secondary | ICD-10-CM

## 2021-04-13 DIAGNOSIS — Z118 Encounter for screening for other infectious and parasitic diseases: Secondary | ICD-10-CM

## 2021-04-13 DIAGNOSIS — N644 Mastodynia: Secondary | ICD-10-CM

## 2021-04-13 DIAGNOSIS — R12 Heartburn: Secondary | ICD-10-CM | POA: Diagnosis not present

## 2021-04-13 DIAGNOSIS — Z1159 Encounter for screening for other viral diseases: Secondary | ICD-10-CM

## 2021-04-13 DIAGNOSIS — L2084 Intrinsic (allergic) eczema: Secondary | ICD-10-CM

## 2021-04-13 NOTE — Assessment & Plan Note (Signed)
Chronic, stable.  Continue Omeprazole and collaboration with GI provider.  Mag level today.

## 2021-04-13 NOTE — Assessment & Plan Note (Signed)
Chronic, ongoing with recent neurology labs showing level 140.  Recommend she ensure she takes shots monthly at home, may need to adjust to every other week if levels continue to run low.  Check level today and CBC.

## 2021-04-13 NOTE — Assessment & Plan Note (Signed)
Chronic, stable.  Denies SI/HI.  Continue Zoloft, Trazodone, Nortriptyline, and Xanax as prescribed by neurology.  Return in 6 months.

## 2021-04-13 NOTE — Assessment & Plan Note (Signed)
Depo injections -- pregnancy testing and CMP today.

## 2021-04-13 NOTE — Patient Instructions (Signed)
Chest Wall Pain Chest wall pain is pain in or around the bones and muscles of your chest. Sometimes, an injury causes this pain. Excessive coughing or overuse of arm and chest muscles may also cause chest wall pain. Sometimes, the cause may not be known. This pain may take several weeks or longer to get better. Follow these instructions at home: Managing pain, stiffness, and swelling  If directed, put ice on the painful area: Put ice in a plastic bag. Place a towel between your skin and the bag. Leave the ice on for 20 minutes, 2-3 times per day. Activity Rest as told by your health care provider. Avoid activities that cause pain. These include any activities that use your chest muscles or your abdominal and side muscles to lift heavy items. Ask your health care provider what activities are safe for you. General instructions  Take over-the-counter and prescription medicines only as told by your health care provider. Do not use any products that contain nicotine or tobacco, such as cigarettes, e-cigarettes, and chewing tobacco. These can delay healing after injury. If you need help quitting, ask your health care provider. Keep all follow-up visits as told by your health care provider. This is important. Contact a health care provider if: You have a fever. Your chest pain becomes worse. You have new symptoms. Get help right away if: You have nausea or vomiting. You feel sweaty or light-headed. You have a cough with mucus from your lungs (sputum) or you cough up blood. You develop shortness of breath. These symptoms may represent a serious problem that is an emergency. Do not wait to see if the symptoms will go away. Get medical help right away. Call your local emergency services (911 in the [place] Do not drive yourself to the hospital. Summary Chest wall pain is pain in or around the bones and muscles of your chest. Depending on the cause, it may be treated with ice, rest, medicines, and  avoiding activities that cause pain. Contact a health care provider if you have a fever, worsening chest pain, or new symptoms. Get help right away if you feel light-headed or you develop shortness of breath. These symptoms may be an emergency. This information is not intended to replace advice given to you by your health care provider. Make sure you discuss any questions you have with your health care provider. Document Revised: 07/16/2020 Document Reviewed: 07/16/2020 Elsevier Patient Education  2022 [employer]  

## 2021-04-13 NOTE — Assessment & Plan Note (Signed)
Chronic, ongoing.  Continue to collaborate with neurology team, recent notes reviewed.

## 2021-04-13 NOTE — Progress Notes (Signed)
BP 137/80   Pulse 90 Comment: apical  Temp 98.7 F (37.1 C) (Oral)   Ht 5' 4.5" (1.638 m)   Wt 149 lb 12.8 oz (67.9 kg)   BMI 25.32 kg/m    Subjective:    Patient ID: Jennifer Watts, female    DOB: April 16, 1996, 25 y.o.   MRN: [id number]  HPI: Jennifer Watts is a 25 y.o. female presenting on 04/13/2021 for comprehensive medical examination. Current medical complaints include: breast pain and itching right breast  She currently lives with: fiance  Reports to right breast will have occasional shooting pain and nipple looks dimply + is bigger then other nipple.  Has some itching to this breast.  No issues with left breast.    GERD Continues on Omeprazole.  Last saw GI 01/04/21. GERD control status: stable Satisfied with current treatment? yes Heartburn frequency: none Medication side effects: no  Medication compliance: stable Dysphagia: no Odynophagia:  no Hematemesis: no Blood in stool: no EGD: no   EZCEMA History of eczema with treatment to back of ankle.  After coming out of shower her skin will be bumpy + has some itchiness to skin all over.  Currently using hemp lotion which she feels help.   Duration:  chronic  Itching: yes Burning: no Redness: no Oozing: no Scaling: no Blisters: no Painful: no Fevers: no Change in detergents/soaps/personal care products: no Recent illness: no Recent travel:no Alleviating factors:  Hemp lotion Treatments attempted: Hemp lotion Shortness of breath: no  Throat/tongue swelling: no Myalgias/arthralgias: no  MULTIPLE SCLEROSIS: Followed by neurology, last saw on 03/29/21.  Currently not taking treatment due to side effects.  Report she feels the best she has in awhile at this time.  Denies any changes in function.  She did notice a bruise to upper left thigh recently and is having some left leg pain since then.  No recent falls or injuries.  Currently is getting B12 shots + taking Vitamin D for history of low levels.  B12 level  on 03/18/21 was 140.  MIGRAINES Followed by neurology.  Continues on Aimovig, Pamelor, and Imitrex.  Reports good control with minimal migraines. Duration: years Onset: gradual Headache status at time of visit: mild Aura: yes Nausea:  yes Vomiting: no Photophobia:  yes Phonophobia:  no Effect on social functioning:  no Numbers of missed days of school/work each month: none at this time Confusion:  no Gait disturbance/ataxia:  no Behavioral changes:  no Fevers:  no   DEPRESSION Continues on Zoloft 50 MG daily for mood.  Also Trazodone 100 MG for sleep, ordered by neurology, they tried increasing but this gave her out of body experience.  Continues Xanax, ordered by neurology, taking three a day and sometimes four.   Mood status: stable Satisfied with current treatment?: yes Symptom severity: mild  Duration of current treatment : chronic Side effects: no Medication compliance: good compliance Psychotherapy/counseling: none Depressed mood: no Anxious mood: no Anhedonia: no Significant weight loss or gain: no Insomnia: yes hard to fall asleep Fatigue: no Feelings of worthlessness or guilt: no Impaired concentration/indecisiveness: yes Suicidal ideations: no Hopelessness: no Crying spells: no Depression screen [facility] 2/9 04/13/2021 09/29/2020 06/18/2020 03/10/2020 12/24/2019  Decreased Interest 2 0 0 1 1  Down, Depressed, Hopeless 0 0 0 1 1  PHQ - 2 Score 2 0 0 2 2  Altered sleeping 3 2 - 0 1  Tired, decreased energy 2 0 - 1 2  Change in appetite 0 0 - 0  0  Feeling bad or failure about yourself  0 0 - 0 1  Trouble concentrating 0 0 - 0 1  Moving slowly or fidgety/restless 1 0 - 1 0  Suicidal thoughts 0 0 - 0 0  PHQ-9 Score 8 2 - 4 7  Difficult doing work/chores Somewhat difficult - - Somewhat difficult Not difficult at all   GAD 7 : Generalized Anxiety Score 04/13/2021 09/29/2020 03/10/2020 12/24/2019  Nervous, Anxious, on Edge 1 1 1 1   Control/stop worrying 0 0 0 1  Worry  too much - different things 0 0 0 1  Trouble relaxing 1 1 1 1   Restless 0 0 0 0  Easily annoyed or irritable 0 0 0 1  Afraid - awful might happen 0 0 0 0  Total GAD 7 Score 2 2 2 5   Anxiety Difficulty Not difficult at all Not difficult at all Not difficult at all Not difficult at all    Past Medical History:  Past Medical History:  Diagnosis Date   Anxiety    Depression    Migraines    MS (multiple sclerosis) (HCC)    Neuromuscular disorder (HCC)     Surgical History:  Past Surgical History:  Procedure Laterality Date   TYMPANOSTOMY TUBE PLACEMENT      Medications:  Current Outpatient Medications on File Prior to Visit  Medication Sig   ALPRAZolam (XANAX) 0.25 MG tablet TAKE 1 TABLET BY MOUTH THREE TIMES A DAY AS NEEDED   cholecalciferol (VITAMIN D3) 25 MCG (1000 UT) tablet Take 1,000 Units by mouth daily.   cyanocobalamin (,VITAMIN B-12,) 1000 MCG/ML injection Inject into the muscle.   Cyanocobalamin (B-12 PO) Take 1 tablet by mouth daily.   Erenumab-aooe (AIMOVIG) 140 MG/ML SOAJ Inject 140 mg into the skin every 30 (thirty) days.   linaclotide (LINZESS) 290 MCG CAPS capsule Take 1 capsule (290 mcg total) by mouth daily.   medroxyPROGESTERone (DEPO-PROVERA) 150 MG/ML injection INJECT 1 ML (150 MG TOTAL) INTO THE MUSCLE EVERY 3 (THREE) MONTHS.   nortriptyline (PAMELOR) 10 MG capsule Take 10 mg by mouth at bedtime.   omeprazole (PRILOSEC) 20 MG capsule Take 1 capsule (20 mg total) by mouth daily.   sertraline (ZOLOFT) 50 MG tablet TAKE 1 TABLET BY MOUTH EVERY DAY   SUMAtriptan (IMITREX) 100 MG tablet Take 50-100 mg by mouth daily as needed.   traZODone (DESYREL) 100 MG tablet TAKE 1 TABLET BY MOUTH EVERY DAY AT NIGHT   Current Facility-Administered Medications on File Prior to Visit  Medication   acetaminophen (TYLENOL) tablet 1,000 mg    Allergies:  Allergies  Allergen Reactions   Reglan [Metoclopramide] Other (See Comments)    Hyperactivity /becomes mean    Ibuprofen Rash   Penicillins Rash    Has patient had a PCN reaction causing immediate rash, facial/tongue/throat swelling, SOB or lightheadedness with hypotension: No Has patient had a PCN reaction causing severe rash involving mucus membranes or skin necrosis: No Has patient had a PCN reaction that required hospitalization: No Has patient had a PCN reaction occurring within the last 10 years: No If all of the above answers are "NO", then may proceed with Cephalosporin use.    Sulfa Antibiotics Rash   Sulfasalazine Rash    Social History:  Social History   Socioeconomic History   Marital status: Single    Spouse name: Not on file   Number of children: Not on file   Years of education: Not on file   Highest education  level: Not on file  Occupational History   Not on file  Tobacco Use   Smoking status: Former    Packs/day: 0.25    Types: Cigarettes    Quit date: 01/09/2020    Years since quitting: 1.2   Smokeless tobacco: Never  Vaping Use   Vaping Use: Former  Substance and Sexual Activity   Alcohol use: Yes    Comment: social   Drug use: No   Sexual activity: Yes    Birth control/protection: Injection  Other Topics Concern   Not on file  Social History Narrative   Not on file   Social Determinants of Health   Financial Resource Strain: Not on file  Food Insecurity: Not on file  Transportation Needs: Not on file  Physical Activity: Not on file  Stress: Not on file  Social Connections: Not on file  Intimate Partner Violence: Not on file   Social History   Tobacco Use  Smoking Status Former   Packs/day: 0.25   Types: Cigarettes   Quit date: 01/09/2020   Years since quitting: 1.2  Smokeless Tobacco Never   Social History   Substance and Sexual Activity  Alcohol Use Yes   Comment: social    Family History:  Family History  Problem Relation Age of Onset   Hypertension Maternal Grandmother    Cancer Paternal Grandfather        throat   Multiple  sclerosis Paternal Grandfather    Anxiety disorder Mother     Past medical history, surgical history, medications, allergies, family history and social history reviewed with patient today and changes made to appropriate areas of the chart.   Review of Systems - negative All other ROS negative except what is listed above and in the HPI.      Objective:    BP 137/80   Pulse 90 Comment: apical  Temp 98.7 F (37.1 C) (Oral)   Ht 5' 4.5" (1.638 m)   Wt 149 lb 12.8 oz (67.9 kg)   BMI 25.32 kg/m   Wt Readings from Last 3 Encounters:  04/13/21 149 lb 12.8 oz (67.9 kg)  11/09/20 147 lb 12.8 oz (67 kg)  09/29/20 146 lb 9.6 oz (66.5 kg)    Physical Exam Vitals and nursing note reviewed. Exam conducted with a chaperone present.  Constitutional:      General: She is awake. She is not in acute distress.    Appearance: She is well-developed and well-groomed. She is not ill-appearing or toxic-appearing.  HENT:     Head: Normocephalic.     Right Ear: Hearing, tympanic membrane, ear canal and external ear normal.     Left Ear: Hearing, tympanic membrane, ear canal and external ear normal.     Nose: Nose normal.     Mouth/Throat:     Mouth: Mucous membranes are moist.     Pharynx: Oropharynx is clear. Uvula midline.  Eyes:     General: Lids are normal.        Right eye: No discharge.        Left eye: No discharge.     Conjunctiva/sclera: Conjunctivae normal.     Pupils: Pupils are equal, round, and reactive to light.  Neck:     Thyroid: No thyromegaly.     Vascular: No carotid bruit.  Cardiovascular:     Rate and Rhythm: Normal rate and regular rhythm.     Heart sounds: Normal heart sounds. No murmur heard.   No gallop.  Pulmonary:  Effort: Pulmonary effort is normal. No accessory muscle usage or respiratory distress.     Breath sounds: Normal breath sounds.  Chest:  Breasts:    Right: Normal.     Left: Normal.     Comments: No masses noted and no tenderness reported right  breast.  Dense tissue palpated. Abdominal:     General: Bowel sounds are normal.     Palpations: Abdomen is soft.     Tenderness: There is no abdominal tenderness.  Musculoskeletal:     Cervical back: Normal range of motion and neck supple.     Right lower leg: No edema.     Left lower leg: No edema.  Lymphadenopathy:     Cervical: No cervical adenopathy.     Upper Body:     Right upper body: No supraclavicular, axillary or pectoral adenopathy.     Left upper body: No supraclavicular, axillary or pectoral adenopathy.  Skin:    General: Skin is warm and dry.  Neurological:     Mental Status: She is alert and oriented to person, place, and time.     Deep Tendon Reflexes:     Reflex Scores:      Brachioradialis reflexes are 2+ on the right side and 2+ on the left side.      Patellar reflexes are 2+ on the right side and 2+ on the left side. Psychiatric:        Attention and Perception: Attention normal.        Mood and Affect: Mood normal.        Speech: Speech normal.        Behavior: Behavior normal. Behavior is cooperative.        Thought Content: Thought content normal.        Cognition and Memory: Cognition normal.        Judgment: Judgment normal.   Results for orders placed or performed in visit on 11/09/20  TSH  Result Value Ref Range   TSH 2.180 0.450 - 4.500 uIU/mL      Assessment & Plan:   Problem List Items Addressed This Visit       Cardiovascular and Mediastinum   Migraine without aura and without status migrainosus, not intractable    Chronic, ongoing.  No red flag symptoms today.  Followed by neurology, continue current medication regimen as prescribed by them.          Nervous and Auditory   Multiple sclerosis (HCC) - Primary    Chronic, ongoing.  Continue to collaborate with neurology team, recent notes reviewed.      Relevant Orders   CBC with Differential/Platelet   Comprehensive metabolic panel   TSH     Musculoskeletal and Integument    Eczema    Chronic, stable, although has occasional flares.  Does report pruritus at baseline, recommend she obtain OTC Sarna lotion to assist.        Other   Anxiety    Chronic, stable.  Denies SI/HI.  Continue Zoloft, Trazodone, Nortriptyline, and Xanax as prescribed by neurology.  Return in 6 months.      Depression    Chronic, stable.  Denies SI/HI.  Continue Zoloft, Trazodone, Nortriptyline, and Xanax as prescribed by neurology.  Return to office in 6 months, sooner if worsening mood.      Heart burn    Chronic, stable.  Continue Omeprazole and collaboration with GI provider.  Mag level today.      Relevant Orders   Magnesium  Uses birth control    Depo injections -- pregnancy testing and CMP today.      Relevant Orders   Pregnancy, urine   Vitamin B12 deficiency    Chronic, ongoing with recent neurology labs showing level 140.  Recommend she ensure she takes shots monthly at home, may need to adjust to every other week if levels continue to run low.  Check level today and CBC.      Relevant Orders   Vitamin B12   Vitamin D deficiency    Chronic, ongoing.  Continue supplement and adjust as needed.  Vitamin D level today.      Relevant Orders   VITAMIN D 25 Hydroxy (Vit-D Deficiency, Fractures)   Other Visit Diagnoses     Breast pain, right       No findings on breast exam, will order ultrasound of right breast to further assess.   Relevant Orders   US BREAST LTD UNI RIGHT INC AXILLA   Encounter for lipid screening for cardiovascular disease       Lipid panel on labs today.   Relevant Orders   Comprehensive metabolic panel   Lipid Panel w/o Chol/HDL Ratio   Need for hepatitis C screening test       Hep C screening on labs today, discussed with patient.   Relevant Orders   Hepatitis C antibody   Screening for chlamydial disease       Chlamydia screening on labs today.   Relevant Orders   GC/Chlamydia Probe Amp   Encounter for annual physical exam        Annual physical with labs today.  Health maintenance reviewed with patient.        Follow up plan: Return in about 6 months (around 10/11/2021).   LABORATORY TESTING:  - Pap smear: pap done on 12/16/2018 with Dr. Dalbert Garnet  IMMUNIZATIONS:   - Tdap: Tetanus vaccination status reviewed: last tetanus booster within 10 years. - Influenza: Not Up To Date -- refuses today - Pneumovax: Not applicable - Prevnar: Not applicable - HPV: Not applicable - Zostavax vaccine: Not applicable - Covid == x 2   SCREENING: -Mammogram: Not applicable  - Colonoscopy: Not applicable  - Bone Density: Not applicable  -Hearing Test: Not applicable  -Spirometry: Not applicable   PATIENT COUNSELING:   Advised to take 1 mg of folate supplement per day if capable of pregnancy.   Sexuality: Discussed sexually transmitted diseases, partner selection, use of condoms, avoidance of unintended pregnancy  and contraceptive alternatives.   Advised to avoid cigarette smoking.  I discussed with the patient that most people either abstain from alcohol or drink within safe limits (<=14/week and <=4 drinks/occasion for males, <=7/weeks and <= 3 drinks/occasion for females) and that the risk for alcohol disorders and other health effects rises proportionally with the number of drinks per week and how often a drinker exceeds daily limits.  Discussed cessation/primary prevention of drug use and availability of treatment for abuse.   Diet: Encouraged to adjust caloric intake to maintain  or achieve ideal body weight, to reduce intake of dietary saturated fat and total fat, to limit sodium intake by avoiding high sodium foods and not adding table salt, and to maintain adequate dietary potassium and calcium preferably from fresh fruits, vegetables, and low-fat dairy products.    Stressed the importance of regular exercise  Injury prevention: Discussed safety belts, safety helmets, smoke detector, smoking near bedding or  upholstery.   Dental health: Discussed importance of regular tooth  brushing, flossing, and dental visits.    NEXT PREVENTATIVE PHYSICAL DUE IN 1 YEAR. Return in about 6 months (around 10/11/2021).

## 2021-04-13 NOTE — Assessment & Plan Note (Signed)
Chronic, ongoing.  No red flag symptoms today.  Followed by neurology, continue current medication regimen as prescribed by them.

## 2021-04-13 NOTE — Assessment & Plan Note (Signed)
Chronic, stable, although has occasional flares.  Does report pruritus at baseline, recommend she obtain OTC Sarna lotion to assist.

## 2021-04-13 NOTE — Assessment & Plan Note (Signed)
Chronic, stable.  Denies SI/HI.  Continue Zoloft, Trazodone, Nortriptyline, and Xanax as prescribed by neurology.  Return to office in 6 months, sooner if worsening mood.

## 2021-04-13 NOTE — Assessment & Plan Note (Signed)
Chronic, ongoing.  Continue supplement and adjust as needed.  Vitamin D level today.

## 2021-04-14 ENCOUNTER — Encounter: Payer: Self-pay | Admitting: Oncology

## 2021-04-14 LAB — VITAMIN B12: Vitamin B-12: 1874 pg/mL — ABNORMAL HIGH (ref 232–1245)

## 2021-04-14 LAB — COMPREHENSIVE METABOLIC PANEL
ALT: 30 IU/L (ref 0–32)
AST: 24 IU/L (ref 0–40)
Albumin/Globulin Ratio: 3 — ABNORMAL HIGH (ref 1.2–2.2)
Albumin: 5.1 g/dL — ABNORMAL HIGH (ref 3.9–5.0)
Alkaline Phosphatase: 64 IU/L (ref 44–121)
BUN/Creatinine Ratio: 16 (ref 9–23)
BUN: 14 mg/dL (ref 6–20)
Bilirubin Total: 0.6 mg/dL (ref 0.0–1.2)
CO2: 21 mmol/L (ref 20–29)
Calcium: 9.8 mg/dL (ref 8.7–10.2)
Chloride: 99 mmol/L (ref 96–106)
Creatinine, Ser: 0.86 mg/dL (ref 0.57–1.00)
Globulin, Total: 1.7 g/dL (ref 1.5–4.5)
Glucose: 79 mg/dL (ref 70–99)
Potassium: 3.9 mmol/L (ref 3.5–5.2)
Sodium: 136 mmol/L (ref 134–144)
Total Protein: 6.8 g/dL (ref 6.0–8.5)
eGFR: 97 mL/min/{1.73_m2} (ref >59)

## 2021-04-14 LAB — CBC WITH DIFFERENTIAL/PLATELET
Basophils Absolute: 0.1 10*3/uL (ref 0.0–0.2)
Basos: 1 %
EOS (ABSOLUTE): 0.3 10*3/uL (ref 0.0–0.4)
Eos: 4 %
Hematocrit: 46.2 % (ref 34.0–46.6)
Hemoglobin: 15.5 g/dL (ref 11.1–15.9)
Immature Grans (Abs): 0.1 10*3/uL (ref 0.0–0.1)
Immature Granulocytes: 1 %
Lymphocytes Absolute: 2.8 10*3/uL (ref 0.7–3.1)
Lymphs: 33 %
MCH: 31.3 pg (ref 26.6–33.0)
MCHC: 33.5 g/dL (ref 31.5–35.7)
MCV: 93 fL (ref 79–97)
Monocytes Absolute: 0.6 10*3/uL (ref 0.1–0.9)
Monocytes: 7 %
Neutrophils Absolute: 4.5 10*3/uL (ref 1.4–7.0)
Neutrophils: 54 %
Platelets: 376 10*3/uL (ref 150–450)
RBC: 4.95 x10E6/uL (ref 3.77–5.28)
RDW: 12.6 % (ref 11.7–15.4)
WBC: 8.3 10*3/uL (ref 3.4–10.8)

## 2021-04-14 LAB — LIPID PANEL W/O CHOL/HDL RATIO
Cholesterol, Total: 199 mg/dL (ref 100–199)
HDL: 57 mg/dL (ref >39)
LDL Chol Calc (NIH): 128 mg/dL — ABNORMAL HIGH (ref 0–99)
Triglycerides: 77 mg/dL (ref 0–149)
VLDL Cholesterol Cal: 14 mg/dL (ref 5–40)

## 2021-04-14 LAB — TSH: TSH: 1.74 u[IU]/mL (ref 0.450–4.500)

## 2021-04-14 LAB — HEPATITIS C ANTIBODY: Hep C Virus Ab: 0.2 s/co ratio (ref 0.0–0.9)

## 2021-04-14 LAB — VITAMIN D 25 HYDROXY (VIT D DEFICIENCY, FRACTURES): Vit D, 25-Hydroxy: 31.4 ng/mL (ref 30.0–100.0)

## 2021-04-14 LAB — PREGNANCY, URINE: Preg Test, Ur: NEGATIVE

## 2021-04-14 LAB — MAGNESIUM: Magnesium: 1.9 mg/dL (ref 1.6–2.3)

## 2021-04-14 NOTE — Progress Notes (Signed)
Contacted via MyChart   Good evening Georgianne, your labs have returned.: - CBC shows no anemia or infection and magnesium level normal. - CMP shows normal kidney function, creatinine and eGFR, and normal liver function, AST and ALT. - Cholesterol levels show slight elevation in LDL, bad cholesterol, you do not need medication for this -- focus on healthy diet and regular exercise. - Thyroid and Vitamin D levels normal -- continue Vitamin D supplement. - B12 level are our check is in normal, in fact a little above, range.  Continue your supplement. - Hep C is negative.  Any questions? Keep being amazing!!  Thank you for allowing me to participate in your care.  I appreciate you. Kindest regards, Domonick Sittner

## 2021-04-15 LAB — GC/CHLAMYDIA PROBE AMP
Chlamydia trachomatis, NAA: NEGATIVE
Neisseria Gonorrhoeae by PCR: NEGATIVE

## 2021-04-15 NOTE — Progress Notes (Signed)
Contacted via MyChart   Good evening, gonorrhea and chlamydia are negative:)  Will need to repeat this in November 2023, but you will soon be able to stop this screening.

## 2021-04-18 ENCOUNTER — Other Ambulatory Visit: Payer: Self-pay | Admitting: Nurse Practitioner

## 2021-04-18 NOTE — Telephone Encounter (Signed)
Requested medications are due for refill today.  yes  Requested medications are on the active medications list.  yes  Last refill. 03/08/2020  Future visit scheduled.   yes  Notes to clinic.  Medication not assigned a protocol.

## 2021-04-29 ENCOUNTER — Other Ambulatory Visit: Payer: Medicaid Other

## 2021-05-11 ENCOUNTER — Other Ambulatory Visit: Payer: Self-pay | Admitting: Gastroenterology

## 2021-05-17 ENCOUNTER — Encounter: Payer: Self-pay | Admitting: Nurse Practitioner

## 2021-05-18 MED ORDER — CLOBETASOL PROPIONATE 0.05 % EX CREA: 1.0000 "application " | TOPICAL_CREAM | Freq: Two times a day (BID) | CUTANEOUS | 0 refills | Status: DC

## 2021-05-29 ENCOUNTER — Other Ambulatory Visit: Payer: Self-pay | Admitting: Gastroenterology

## 2021-06-06 ENCOUNTER — Encounter: Payer: Self-pay | Admitting: Nurse Practitioner

## 2021-06-06 NOTE — Telephone Encounter (Signed)
Pt is scheduled 1/25

## 2021-06-08 ENCOUNTER — Ambulatory Visit: Payer: Medicaid Other | Admitting: Nurse Practitioner

## 2021-06-14 ENCOUNTER — Encounter: Payer: Self-pay | Admitting: Nurse Practitioner

## 2021-06-14 NOTE — Telephone Encounter (Signed)
Appt scheduled

## 2021-06-15 ENCOUNTER — Encounter: Payer: Self-pay | Admitting: Nurse Practitioner

## 2021-06-15 ENCOUNTER — Telehealth (INDEPENDENT_AMBULATORY_CARE_PROVIDER_SITE_OTHER): Payer: Medicaid Other | Admitting: Nurse Practitioner

## 2021-06-15 DIAGNOSIS — J029 Acute pharyngitis, unspecified: Secondary | ICD-10-CM

## 2021-06-15 MED ORDER — AZITHROMYCIN 250 MG PO TABS: ORAL_TABLET | ORAL | 0 refills | Status: AC

## 2021-06-15 MED ORDER — PREDNISONE 20 MG PO TABS: 40.0000 mg | ORAL_TABLET | Freq: Every day | ORAL | 0 refills | Status: AC

## 2021-06-15 MED ORDER — LIDOCAINE VISCOUS HCL 2 % MT SOLN: 15.0000 mL | OROMUCOSAL | 0 refills | Status: DC | PRN

## 2021-06-15 NOTE — Patient Instructions (Signed)
Sore Throat When you have a sore throat, your throat may feel: Tender. Burning. Irritated. Scratchy. Painful when you swallow. Painful when you talk. Many things can cause a sore throat, such as: An infection. Allergies. Dry air. Smoke or pollution. Radiation treatment for cancer. Gastroesophageal reflux disease (GERD). A tumor. A sore throat can be the first sign of another sickness. It can happen with other problems, like: Coughing. Sneezing. Fever. Swelling of the glands in the neck. Most sore throats go away without treatment. Follow these instructions at home:   Medicines Take over-the-counter and prescription medicines only as told by your doctor. Children often get sore throats. Do not give your child aspirin. Use throat sprays to soothe your throat as told by your health care provider. Managing pain To help with pain: Sip warm liquids, such as broth, herbal tea, or warm water. Eat or drink cold or frozen liquids, such as frozen ice pops. Rinse your mouth (gargle) with a salt water mixture 3-4 times a day or as needed. To make salt water, dissolve -1 tsp (3-6 g) of salt in 1 cup (237 mL) of warm water. Do not swallow this mixture. Suck on hard candy or throat lozenges. Put a cool-mist humidifier in your bedroom at night. Sit in the bathroom with the door closed for 5-10 minutes while you run hot water in the shower. General instructions Do not smoke or use any products that contain nicotine or tobacco. If you need help quitting, ask your doctor. Get plenty of rest. Drink enough fluid to keep your pee (urine) pale yellow. Wash your hands often for at least 20 seconds with soap and water. If soap and water are not available, use hand sanitizer. Contact a doctor if: You have a fever for more than 2-3 days. You keep having symptoms for more than 2-3 days. Your throat does not get better in 7 days. You have a fever and your symptoms suddenly get worse. Your child  who is 3 months to 3 years old has a temperature of 102.2F (39C) or higher. Get help right away if: You have trouble breathing. You cannot swallow fluids, soft foods, or your spit. You have swelling in your throat or neck that gets worse. You feel like you may vomit (nauseous) and this feeling lasts a long time. You cannot stop vomiting. These symptoms may be an emergency. Get help right away. Call your local emergency services (911 in the [place] Do not wait to see if the symptoms will go away. Do not drive yourself to the hospital. Summary A sore throat is a painful, burning, irritated, or scratchy throat. Many things can cause a sore throat. Take over-the-counter medicines only as told by your doctor. Get plenty of rest. Drink enough fluid to keep your pee (urine) pale yellow. Contact a doctor if your symptoms get worse or your sore throat does not get better within 7 days. This information is not intended to replace advice given to you by your health care provider. Make sure you discuss any questions you have with your health care provider. Document Revised: 07/28/2020 Document Reviewed: 07/28/2020 [employer] Patient Education  2022 [employer]  

## 2021-06-15 NOTE — Progress Notes (Signed)
There were no vitals taken for this visit.   Subjective:    Patient ID: Jennifer Watts, female    DOB: 1995-08-19, 26 y.o.   MRN: [id number]  HPI: Jennifer Watts is a 26 y.o. female  Chief Complaint  Patient presents with   Sore Throat   Ulcer    Patient states she noticed Monday she has a larger ulcer in the back of throat. Patient states it is really red back there and it is hard for her to eat and swallow. Patient states she has tried sore throat numbing spray ([facility] brand).   This visit was completed via video visit through MyChart due to the restrictions of the COVID-19 pandemic. All issues as above were discussed and addressed. Physical exam was done as above through visual confirmation on video through MyChart. If it was felt that the patient should be evaluated in the office, they were directed there. The patient verbally consented to this visit. Location of the patient: home Location of the provider: work Those involved with this call:  Provider: Aura Dials, DNP CMA: Malen Gauze, CMA Front Desk/Registration: [employer]  Time spent on call:  21 minutes with patient face to face via video conference. More than 50% of this time was spent in counseling and coordination of care. 15 minutes total spent in review of patient's record and preparation of their chart.  I verified patient identity using two factors (patient name and date of birth). Patient consents verbally to being seen via telemedicine visit today.    UPPER RESPIRATORY TRACT INFECTION Started feeling bad on Monday with sore throat and ulcer to left side.  Has had two Covid vaccinations.   Worst symptom: sore throat Fever: no Cough: no Shortness of breath: no Wheezing: no Chest pain: no Chest tightness: no Chest congestion: no Nasal congestion: no Runny nose: no Post nasal drip: no Sneezing: no Sore throat: yes Swollen glands: yes Sinus pressure: no Headache: yes Face pain: no Toothache: yes on  left side Ear pain: none Ear pressure: none Eyes red/itching:no Eye drainage/crusting: no  Vomiting: no Rash: no Fatigue: yes Sick contacts: no Strep contacts: no  Context: fluctuating Recurrent sinusitis: no Relief with OTC cold/cough medications: no  Treatments attempted:  Chloraseptic, salt water gargle     Relevant past medical, surgical, family and social history reviewed and updated as indicated. Interim medical history since our last visit reviewed. Allergies and medications reviewed and updated.  Review of Systems  Constitutional:  Positive for fatigue. Negative for activity change, appetite change, chills and fever.  HENT:  Positive for sinus pressure and sore throat. Negative for congestion, ear discharge, ear pain, facial swelling, postnasal drip, rhinorrhea, sinus pain, sneezing and voice change.   Respiratory: Negative.    Cardiovascular:  Negative for chest pain, palpitations and leg swelling.  Gastrointestinal: Negative.   Endocrine: Negative.   Neurological:  Positive for headaches. Negative for dizziness and numbness.  Psychiatric/Behavioral: Negative.     Per HPI unless specifically indicated above     Objective:    There were no vitals taken for this visit.  Wt Readings from Last 3 Encounters:  04/13/21 149 lb 12.8 oz (67.9 kg)  11/09/20 147 lb 12.8 oz (67 kg)  09/29/20 146 lb 9.6 oz (66.5 kg)    Physical Exam Vitals and nursing note reviewed.  Constitutional:      General: She is awake. She is not in acute distress.    Appearance: She is well-developed. She is  ill-appearing. She is not toxic-appearing.  HENT:     Head: Normocephalic.     Right Ear: Hearing normal.     Left Ear: Hearing normal.  Eyes:     General: Lids are normal.        Right eye: No discharge.        Left eye: No discharge.     Conjunctiva/sclera: Conjunctivae normal.  Pulmonary:     Effort: Pulmonary effort is normal. No accessory muscle usage or respiratory distress.   Musculoskeletal:     Cervical back: Normal range of motion.  Neurological:     Mental Status: She is alert and oriented to person, place, and time.  Psychiatric:        Attention and Perception: Attention normal.        Mood and Affect: Mood normal.        Behavior: Behavior normal. Behavior is cooperative.        Thought Content: Thought content normal.        Judgment: Judgment normal.    Results for orders placed or performed in visit on 06/15/21  Rapid Strep Screen ([facility] ONLY)   Specimen: Other   Other  Result Value Ref Range   Strep Gp A Ag, IA W/Reflex Negative Negative  Culture, Group A Strep   Other  Result Value Ref Range   Strep A Culture WILL FOLLOW       Assessment & Plan:   Problem List Items Addressed This Visit       Other   Sore throat - Primary    Symptoms present for 3 days with history of strep in past leading to hospitalization due to sepsis.  Strep negative here in office today, will send for culture, and obtained Covid testing. At this time will treat for strep infection due to her past history and risks + symptoms and visual exam via photos noting findings suspicious for strep.  Is allergic to Penicillin. Will treat with Azithromycin, script sent, + viscous Lidocaine & Prednisone 40 MG x 5 days.  Recommend salt water gargles at home and warm liquids.  If ongoing or worsening immediately return to office.       Relevant Orders   Rapid Strep Screen ([facility] ONLY) (Completed)   Novel Coronavirus, NAA ([facility])    I discussed the assessment and treatment plan with the patient. The patient was provided an opportunity to ask questions and all were answered. The patient agreed with the plan and demonstrated an understanding of the instructions.   The patient was advised to call back or seek an in-person evaluation if the symptoms worsen or if the condition fails to improve as anticipated.   I provided 21+ minutes of time during this  encounter.   Follow up plan: Return if symptoms worsen or fail to improve.

## 2021-06-15 NOTE — Assessment & Plan Note (Signed)
Symptoms present for 3 days with history of strep in past leading to hospitalization due to sepsis.  Strep negative here in office today, will send for culture, and obtained Covid testing. At this time will treat for strep infection due to her past history and risks + symptoms and visual exam via photos noting findings suspicious for strep.  Is allergic to Penicillin. Will treat with Azithromycin, script sent, + viscous Lidocaine & Prednisone 40 MG x 5 days.  Recommend salt water gargles at home and warm liquids.  If ongoing or worsening immediately return to office.

## 2021-06-16 LAB — SARS-COV-2, NAA 2 DAY TAT

## 2021-06-16 LAB — NOVEL CORONAVIRUS, NAA: SARS-CoV-2, NAA: NOT DETECTED

## 2021-06-16 NOTE — Progress Notes (Signed)
Contacted via MyChart   Good news, your Covid is negative!!

## 2021-06-17 ENCOUNTER — Telehealth: Payer: Medicaid Other | Admitting: Nurse Practitioner

## 2021-06-18 LAB — RAPID STREP SCREEN (MED CTR MEBANE ONLY): Strep Gp A Ag, IA W/Reflex: NEGATIVE

## 2021-06-18 LAB — CULTURE, GROUP A STREP: Strep A Culture: NEGATIVE

## 2021-06-18 NOTE — Progress Notes (Signed)
Contacted via MyChart   Strep culture returned negative.  Hope you are feeling better.

## 2021-07-10 IMAGING — MR MR CERVICAL SPINE WO/W CM
5 of 8 series · 29 of 48 positions shown · IV contrast (6ml Gadavist)
Comparison: MRI of the cervical spine March 08, 2019.

CLINICAL DATA: Multiple sclerosis.

EXAM:
MRI CERVICAL SPINE WITHOUT AND WITH CONTRAST
TECHNIQUE: Multiplanar and multiecho pulse sequences of the cervical spine, to
include the craniocervical junction and cervicothoracic junction,
were obtained without and with intravenous contrast.
CONTRAST:  6mL GADAVIST GADOBUTROL 1 MMOL/ML IV SOLN

[Series 9: T2 · sagittal · 3.0mm · 0.62mm/px · 4 of 15 slices shown (1 of 2)]
[im 1/15]
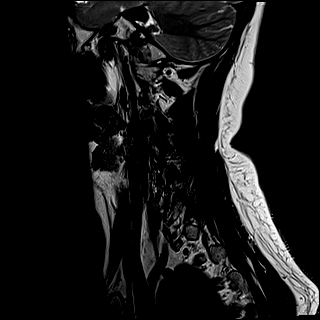
[im 5/15]
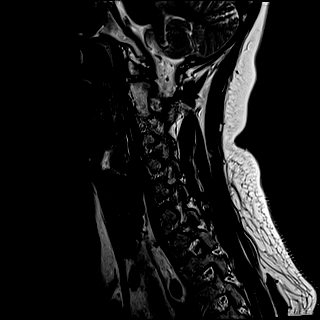
[im 10/15]
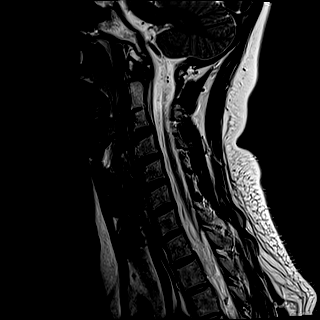
[im 15/15]
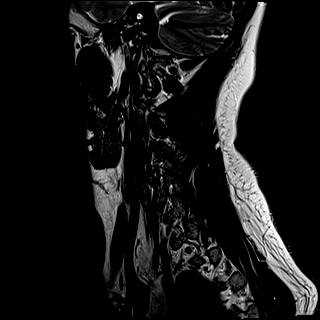

[Series 11: STIR · sagittal · 3.0mm · 0.62mm/px · 4 of 15 slices shown]
[im 1/15]
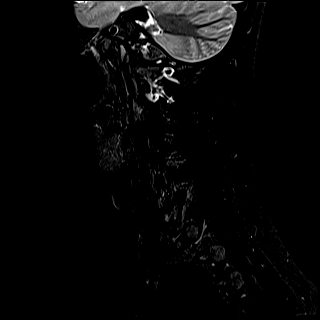
[im 5/15]
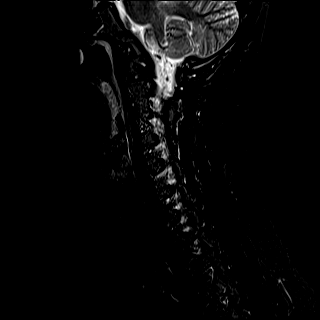
[im 10/15]
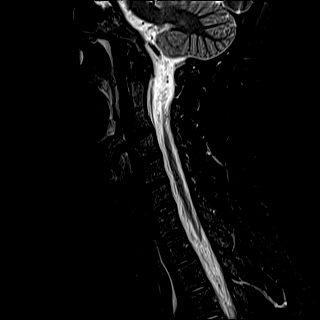
[im 15/15]
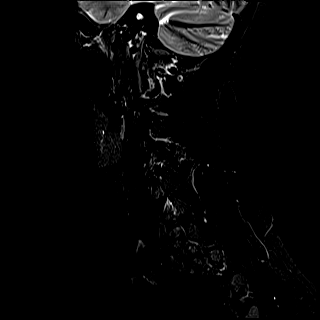

[Series 12: T2 · axial · 3.0mm · 0.70mm/px · z∈[-235,-138]mm · 8 of 29 slices shown (2 of 2)]
[im 1/29]
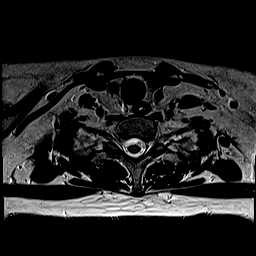
[im 5/29]
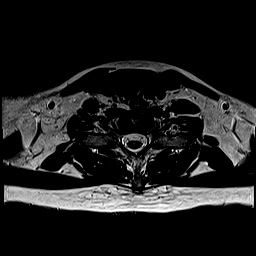
[im 9/29]
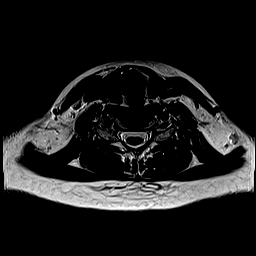
[im 13/29]
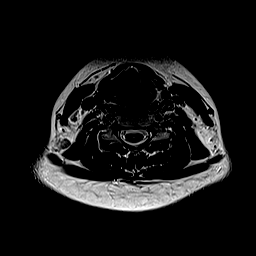
[im 17/29]
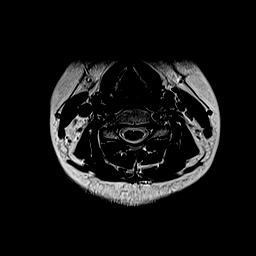
[im 21/29]
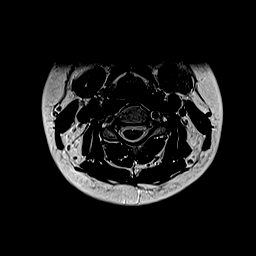
[im 25/29]
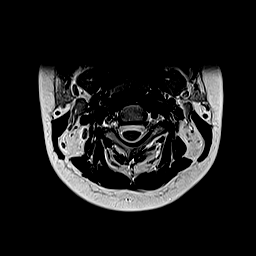
[im 29/29]
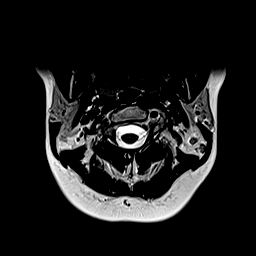

[Series 14: T1 · axial · non-contrast · 3.0mm · 0.35mm/px · z∈[-235,-138]mm · 8 of 29 slices shown]
[im 1/29]
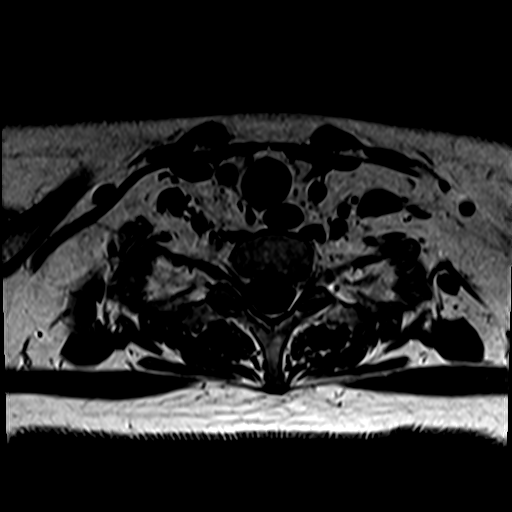
[im 5/29]
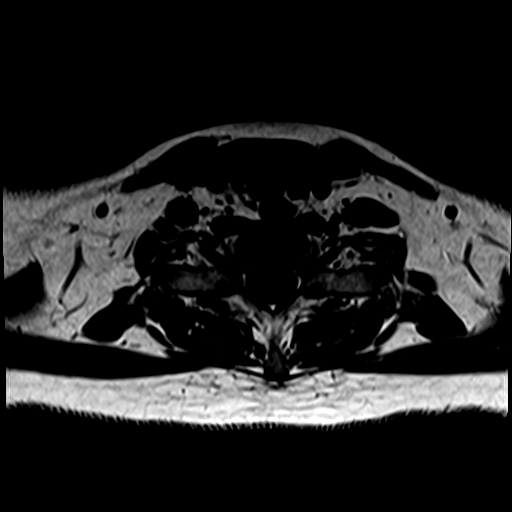
[im 9/29]
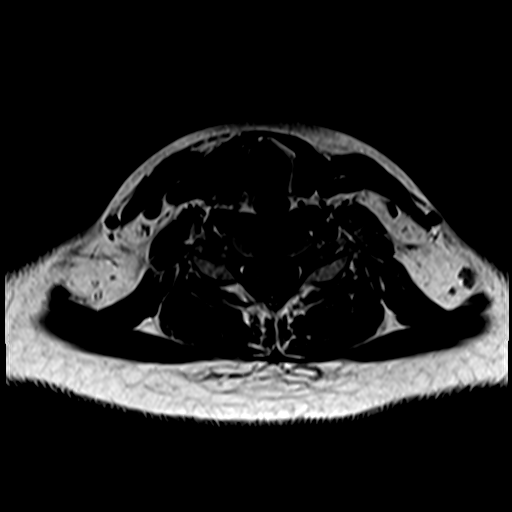
[im 13/29]
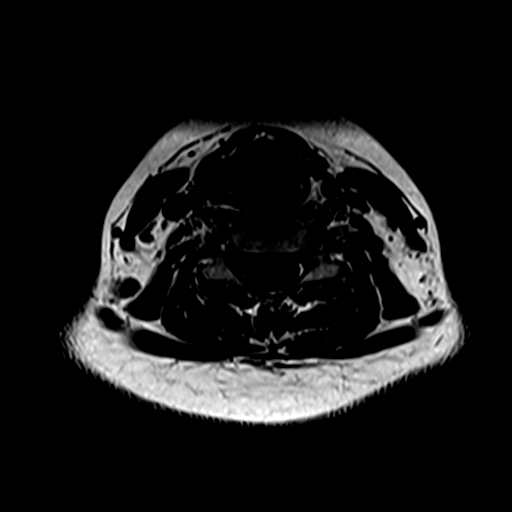
[im 17/29]
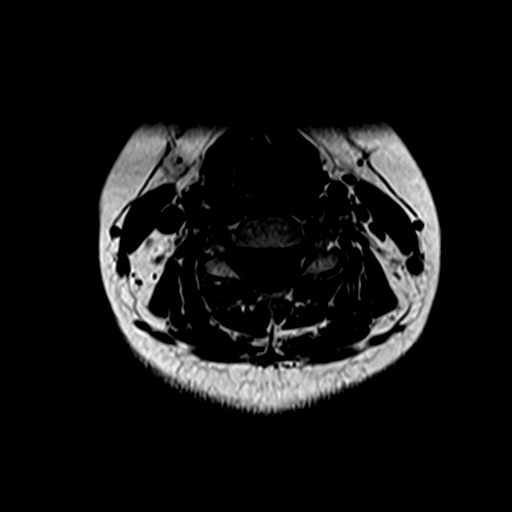
[im 21/29]
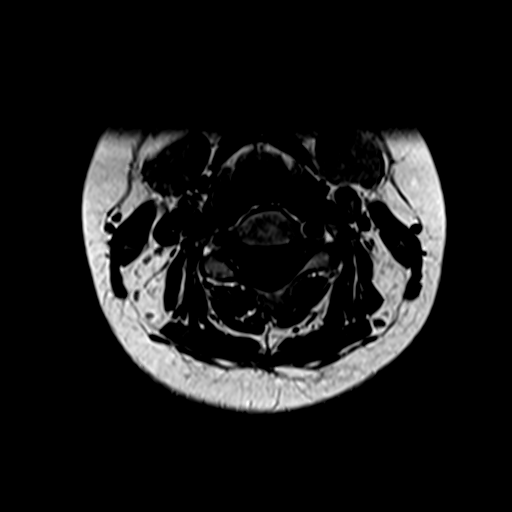
[im 25/29]
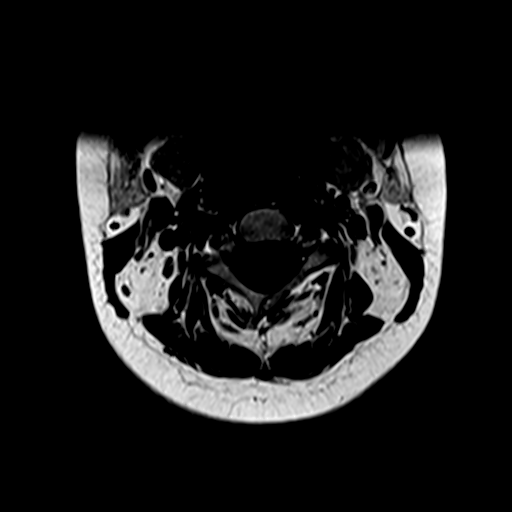
[im 29/29]
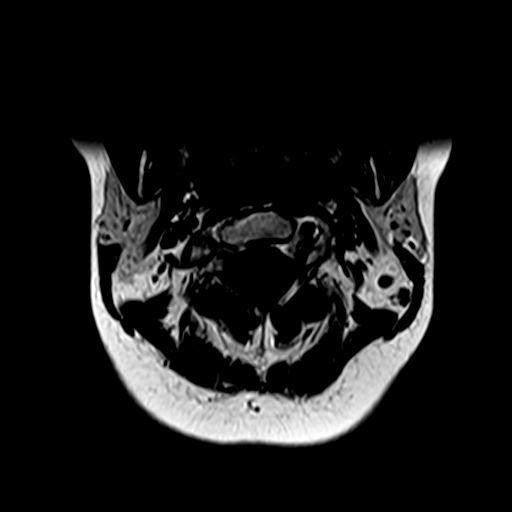

[Series 16: T1 post-contrast · axial · 3.0mm · 0.35mm/px · z∈[-235,-180]mm · 5 of 29 slices shown]
[im 1/29]
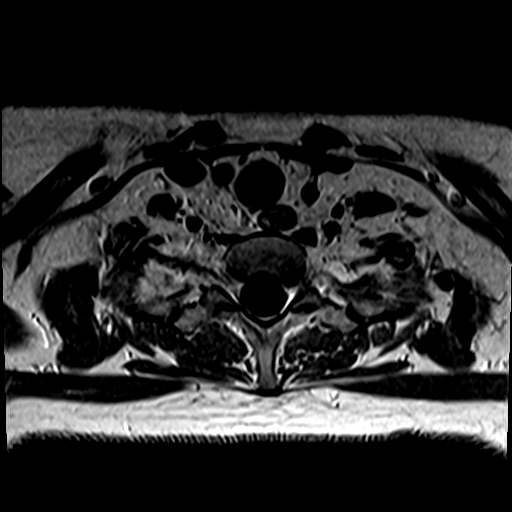
[im 5/29]
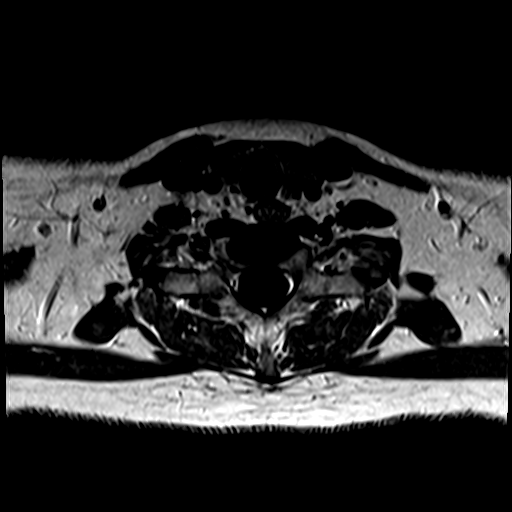
[im 9/29]
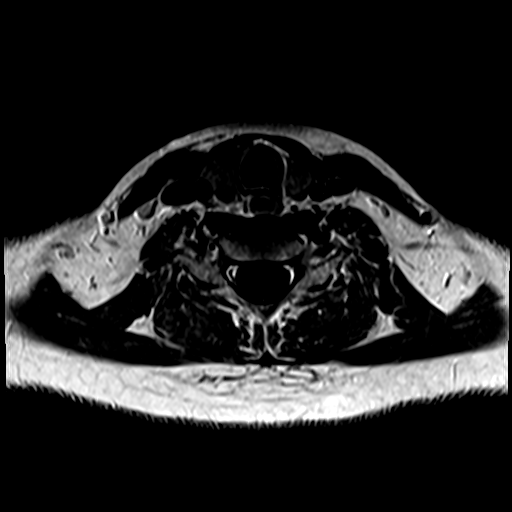
[im 13/29]
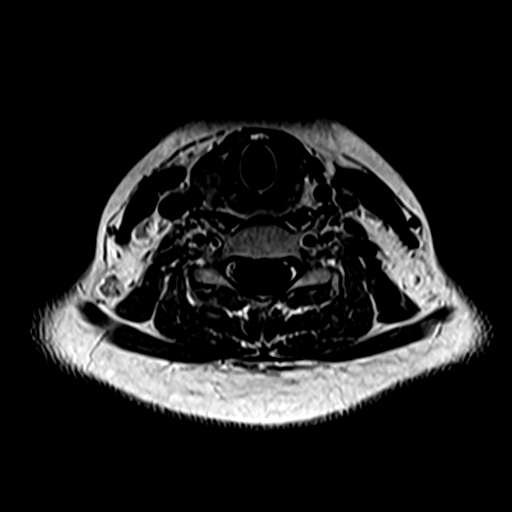
[im 17/29]
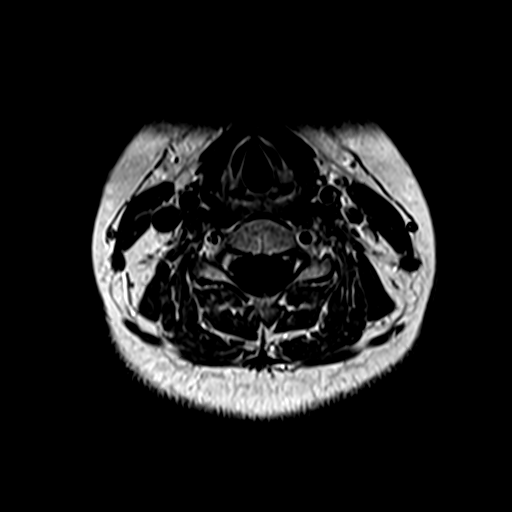

[29 of 48 positions shown; findings below may reference images not displayed]

FINDINGS: Alignment: Straightening of the cervical curvature.

Vertebrae: No fracture, evidence of discitis, or bone lesion.
Congenital fusion of C2 and C3.

Cord: Normal signal and morphology. No focus of abnormal contrast
enhancement.

Posterior Fossa, vertebral arteries, paraspinal tissues: Negative.

Disc levels:

No significant disc herniation, spinal canal or neural foraminal
stenosis at any level.
IMPRESSION: Normal cervical spine MRI. No evidence of demyelinating disease.

## 2021-08-01 ENCOUNTER — Other Ambulatory Visit: Payer: Self-pay | Admitting: Neurology

## 2021-08-01 ENCOUNTER — Other Ambulatory Visit (HOSPITAL_COMMUNITY): Payer: Self-pay | Admitting: Neurology

## 2021-08-01 DIAGNOSIS — G35D Multiple sclerosis, unspecified: Secondary | ICD-10-CM

## 2021-08-01 DIAGNOSIS — R519 Headache, unspecified: Secondary | ICD-10-CM

## 2021-08-01 DIAGNOSIS — H469 Unspecified optic neuritis: Secondary | ICD-10-CM

## 2021-08-01 DIAGNOSIS — G35 Multiple sclerosis: Secondary | ICD-10-CM

## 2021-08-12 ENCOUNTER — Ambulatory Visit
Admission: RE | Admit: 2021-08-12 | Discharge: 2021-08-12 | Disposition: A | Discharge status: Home / Self Care | Payer: Medicaid Other | Source: Ambulatory Visit | Attending: Neurology | Admitting: Neurology

## 2021-08-12 DIAGNOSIS — H469 Unspecified optic neuritis: Secondary | ICD-10-CM | POA: Diagnosis present

## 2021-08-12 DIAGNOSIS — R519 Headache, unspecified: Secondary | ICD-10-CM | POA: Insufficient documentation

## 2021-08-12 DIAGNOSIS — G35 Multiple sclerosis: Secondary | ICD-10-CM | POA: Diagnosis present

## 2021-08-12 MED ORDER — GADOBUTROL 1 MMOL/ML IV SOLN
6.0000 mL | Freq: Once | INTRAVENOUS | Status: AC | PRN
  Administered 2021-08-12: 6 mL via INTRAVENOUS

## 2021-08-17 ENCOUNTER — Encounter: Payer: Self-pay | Admitting: Gastroenterology

## 2021-09-16 ENCOUNTER — Other Ambulatory Visit: Payer: Self-pay | Admitting: Nurse Practitioner

## 2021-09-16 NOTE — Telephone Encounter (Signed)
Requested medication (s) are due for refill today: Yes ? ?Requested medication (s) are on the active medication list: Yes ? ?Last refill:  05/18/21 ? ?Future visit scheduled: Yes ? ?Notes to clinic:  Unable to refill per protocol, cannot delegate. ? ? ? ? ? ?Requested Prescriptions  ?Pending Prescriptions Disp Refills  ? clobetasol cream (TEMOVATE) 0.05 % [Pharmacy Med Name: CLOBETASOL 0.05% CREAM] 30 g 0  ?  Sig: APPLY TO AFFECTED AREA TWICE A DAY  ?  ? Not Delegated - Dermatology:  Corticosteroids Failed - 09/16/2021  3:40 PM  ?  ?  Failed - This refill cannot be delegated  ?  ?  Passed - Valid encounter within last 12 months  ?  Recent Outpatient Visits   ? ?      ? 3 months ago Sore throat  ? [facility] [place], [place] T, NP  ? 5 months ago Multiple sclerosis (HCC)  ? [facility] [place], [place] T, NP  ? 11 months ago Mild episode of recurrent major depressive disorder (HCC)  ? [facility] [place], Corrie Dandy T, NP  ? 1 year ago Sore throat  ? [facility] [place], [place] T, NP  ? 1 year ago Migraine without aura and without status migrainosus, not intractable  ? [facility] [place], Corrie Dandy T, NP  ? ?  ?  ?Future Appointments   ? ?        ? In 3 weeks Cannady, Dorie Rank, NP [employer]  ? ?  ? ? ?  ?  ?  ? ? ? ? ?

## 2021-10-11 ENCOUNTER — Ambulatory Visit: Payer: Medicaid Other | Admitting: Nurse Practitioner

## 2021-10-26 ENCOUNTER — Encounter: Payer: Self-pay | Admitting: Nurse Practitioner

## 2021-10-27 ENCOUNTER — Encounter: Payer: Self-pay | Admitting: Nurse Practitioner

## 2021-10-27 ENCOUNTER — Ambulatory Visit (INDEPENDENT_AMBULATORY_CARE_PROVIDER_SITE_OTHER): Payer: Medicaid Other | Admitting: Nurse Practitioner

## 2021-10-27 VITALS — BP 122/89 | HR 89 | Temp 98.4°F | Ht 64.5 in | Wt 155.4 lb

## 2021-10-27 DIAGNOSIS — R8281 Pyuria: Secondary | ICD-10-CM | POA: Diagnosis not present

## 2021-10-27 DIAGNOSIS — N760 Acute vaginitis: Secondary | ICD-10-CM

## 2021-10-27 DIAGNOSIS — R399 Unspecified symptoms and signs involving the genitourinary system: Secondary | ICD-10-CM

## 2021-10-27 DIAGNOSIS — B9689 Other specified bacterial agents as the cause of diseases classified elsewhere: Secondary | ICD-10-CM | POA: Insufficient documentation

## 2021-10-27 LAB — URINALYSIS, ROUTINE W REFLEX MICROSCOPIC
Bilirubin, UA: NEGATIVE
Glucose, UA: NEGATIVE
Ketones, UA: NEGATIVE
Nitrite, UA: NEGATIVE
Protein,UA: NEGATIVE
RBC, UA: NEGATIVE
Specific Gravity, UA: 1.025 (ref 1.005–1.030)
Urobilinogen, Ur: 1 mg/dL (ref 0.2–1.0)
pH, UA: 6 (ref 5.0–7.5)

## 2021-10-27 LAB — MICROSCOPIC EXAMINATION: RBC, Urine: NONE SEEN /hpf (ref 0–2)

## 2021-10-27 LAB — WET PREP FOR TRICH, YEAST, CLUE
Clue Cell Exam: POSITIVE — AB
Trichomonas Exam: NEGATIVE
Yeast Exam: NEGATIVE

## 2021-10-27 MED ORDER — METRONIDAZOLE 0.75 % VA GEL
1.0000 | Freq: Two times a day (BID) | VAGINAL | 0 refills | Status: AC
Start: 2021-10-27 — End: 2021-11-03

## 2021-10-27 MED ORDER — METRONIDAZOLE 500 MG PO TABS: 500.0000 mg | ORAL_TABLET | Freq: Two times a day (BID) | ORAL | 0 refills | Status: AC

## 2021-10-27 NOTE — Patient Instructions (Signed)

## 2021-10-27 NOTE — Assessment & Plan Note (Signed)
Present x 1 week with history of same treated with Metrogel, will send in script for this today.  Wet prep + clue cells, negative yeast and trich.  UA overall stable, will send for culture to ensure no infection present and treat as needed.  Discussed at length BV with patient.  Return if worsening or ongoing symptoms.

## 2021-10-27 NOTE — Progress Notes (Signed)
BP 122/89   Pulse 89   Temp 98.4 F (36.9 C) (Oral)   Ht 5' 4.5" (1.638 m)   Wt 155 lb 6.4 oz (70.5 kg)   SpO2 98%   BMI 26.26 kg/m    Subjective:    Patient ID: Jennifer Watts, female    DOB: 10/09/1995, 26 y.o.   MRN: [id number]  HPI: Jennifer Watts is a 26 y.o. female  Chief Complaint  Patient presents with   Bacterial Vaginosis    Patient is here for possible BV infection. Patient says is having some irritation in her vaginal area and says her urine smells very acidic. Patient states her symptoms started a couple of days ago, but notice worsening in her symptoms yesterday. Patient denies trying any over the counter medications.    Vaginal Discharge   Vaginal Itching   VAGINAL DISCHARGE Started one week ago with symptoms. History of frequent BV.  Is getting IUD on the 27th, Dr. Dalbert Garnet.  Duration: weeks Discharge description: clear  Pruritus: yes Dysuria: no Malodorous: yes Urinary frequency: yes Fevers: no Abdominal pain: no  Sexual activity: monogamous History of sexually transmitted diseases: no Recent antibiotic use: no Context:stable Treatments attempted: none   Relevant past medical, surgical, family and social history reviewed and updated as indicated. Interim medical history since our last visit reviewed. Allergies and medications reviewed and updated.  Review of Systems  Constitutional:  Negative for activity change, appetite change, diaphoresis, fatigue and fever.  Respiratory:  Negative for cough, chest tightness and shortness of breath.   Cardiovascular:  Negative for chest pain, palpitations and leg swelling.  Gastrointestinal: Negative.   Genitourinary:  Positive for vaginal discharge. Negative for dysuria, frequency, hematuria and urgency.  Psychiatric/Behavioral: Negative.      Per HPI unless specifically indicated above     Objective:    BP 122/89   Pulse 89   Temp 98.4 F (36.9 C) (Oral)   Ht 5' 4.5" (1.638 m)   Wt 155 lb 6.4 oz  (70.5 kg)   SpO2 98%   BMI 26.26 kg/m   Wt Readings from Last 3 Encounters:  10/27/21 155 lb 6.4 oz (70.5 kg)  04/13/21 149 lb 12.8 oz (67.9 kg)  11/09/20 147 lb 12.8 oz (67 kg)    Physical Exam Vitals and nursing note reviewed.  Constitutional:      General: She is awake. She is not in acute distress.    Appearance: She is well-developed and well-groomed. She is not ill-appearing.  HENT:     Head: Normocephalic.     Right Ear: Hearing normal.     Left Ear: Hearing normal.  Eyes:     General: Lids are normal.        Right eye: No discharge.        Left eye: No discharge.     Conjunctiva/sclera: Conjunctivae normal.     Pupils: Pupils are equal, round, and reactive to light.  Neck:     Thyroid: No thyromegaly.     Vascular: No carotid bruit.  Cardiovascular:     Rate and Rhythm: Normal rate and regular rhythm.     Heart sounds: Normal heart sounds. No murmur heard.    No gallop.  Pulmonary:     Effort: Pulmonary effort is normal. No accessory muscle usage or respiratory distress.     Breath sounds: Normal breath sounds.  Abdominal:     General: Bowel sounds are normal.     Palpations: Abdomen is soft.  Tenderness: There is no abdominal tenderness.  Musculoskeletal:     Cervical back: Normal range of motion and neck supple.     Right lower leg: No edema.     Left lower leg: No edema.  Skin:    General: Skin is warm and dry.  Neurological:     Mental Status: She is alert and oriented to person, place, and time.  Psychiatric:        Attention and Perception: Attention normal.        Mood and Affect: Mood normal.        Speech: Speech normal.        Behavior: Behavior normal. Behavior is cooperative.        Thought Content: Thought content normal.     Results for orders placed or performed in visit on 10/27/21  Microscopic Examination  Result Value Ref Range   WBC, UA 0-5 0 - 5 /hpf   RBC None seen 0 - 2 /hpf   Epithelial Cells (non renal) 0-10 0 - 10 /hpf    Mucus, UA Present (A) Not Estab.   Bacteria, UA Few (A) None seen/Few  WET PREP FOR TRICH, YEAST, CLUE   Urine  Result Value Ref Range   Trichomonas Exam Negative Negative   Yeast Exam Negative Negative   Clue Cell Exam Positive (A) Negative  Urinalysis, Routine w reflex microscopic  Result Value Ref Range   Specific Gravity, UA 1.025 1.005 - 1.030   pH, UA 6.0 5.0 - 7.5   Color, UA Yellow Yellow   Appearance Ur Clear Clear   Leukocytes,UA Trace (A) Negative   Protein,UA Negative Negative/Trace   Glucose, UA Negative Negative   Ketones, UA Negative Negative   RBC, UA Negative Negative   Bilirubin, UA Negative Negative   Urobilinogen, Ur 1.0 0.2 - 1.0 mg/dL   Nitrite, UA Negative Negative   Microscopic Examination See below:       Assessment & Plan:   Problem List Items Addressed This Visit       Genitourinary   BV (bacterial vaginosis) - Primary    Present x 1 week with history of same treated with Metrogel, will send in script for this today.  Wet prep + clue cells, negative yeast and trich.  UA overall stable, will send for culture to ensure no infection present and treat as needed.  Discussed at length BV with patient.  Return if worsening or ongoing symptoms.      Relevant Orders   WET PREP FOR TRICH, YEAST, CLUE   Urinalysis, Routine w reflex microscopic (Completed)   Other Visit Diagnoses     Pyuria       Urine for culture   Relevant Orders   Urine Culture        Follow up plan: Return in about 6 months (around 04/14/2022) for Annual physical.

## 2021-10-29 LAB — URINE CULTURE

## 2021-10-29 NOTE — Progress Notes (Signed)
Contacted via MyChart   Good evening Karisa, your urine shows no infection:)

## 2021-11-04 ENCOUNTER — Encounter: Payer: Self-pay | Admitting: Nurse Practitioner

## 2021-11-22 ENCOUNTER — Encounter: Payer: Self-pay | Admitting: Nurse Practitioner

## 2021-11-22 DIAGNOSIS — N898 Other specified noninflammatory disorders of vagina: Secondary | ICD-10-CM

## 2021-11-23 ENCOUNTER — Encounter: Payer: Self-pay | Admitting: Nurse Practitioner

## 2021-11-23 ENCOUNTER — Ambulatory Visit (INDEPENDENT_AMBULATORY_CARE_PROVIDER_SITE_OTHER): Payer: Medicaid Other | Admitting: Nurse Practitioner

## 2021-11-23 VITALS — BP 132/89 | HR 97 | Temp 98.0°F | Wt 157.4 lb

## 2021-11-23 DIAGNOSIS — L918 Other hypertrophic disorders of the skin: Secondary | ICD-10-CM

## 2021-11-23 DIAGNOSIS — F5104 Psychophysiologic insomnia: Secondary | ICD-10-CM

## 2021-11-23 DIAGNOSIS — N898 Other specified noninflammatory disorders of vagina: Secondary | ICD-10-CM | POA: Diagnosis not present

## 2021-11-23 LAB — MICROSCOPIC EXAMINATION
Bacteria, UA: NONE SEEN
WBC, UA: NONE SEEN /hpf (ref 0–5)

## 2021-11-23 LAB — URINALYSIS, ROUTINE W REFLEX MICROSCOPIC
Bilirubin, UA: NEGATIVE
Glucose, UA: NEGATIVE
Ketones, UA: NEGATIVE
Leukocytes,UA: NEGATIVE
Nitrite, UA: NEGATIVE
Protein,UA: NEGATIVE
Specific Gravity, UA: 1.01 (ref 1.005–1.030)
Urobilinogen, Ur: 0.2 mg/dL (ref 0.2–1.0)
pH, UA: 7 (ref 5.0–7.5)

## 2021-11-23 LAB — WET PREP FOR TRICH, YEAST, CLUE
Clue Cell Exam: NEGATIVE
Trichomonas Exam: NEGATIVE
Yeast Exam: NEGATIVE

## 2021-11-23 NOTE — Assessment & Plan Note (Signed)
Chronic, ongoing issue.  Has been followed by neurology on this and utilized multiple medications.  Currently taking Xanax and Benadryl, still not sleeping well.  Recommend therapy, CBT, to work on sleep issues and mental health.  Referral placed.  Discussed trial of Ashwagandha or Magnesium supplement at night to help with sleep.  Continue to collaborate with neurology.

## 2021-11-23 NOTE — Patient Instructions (Signed)
Insomnia Insomnia is a sleep disorder that makes it difficult to fall asleep or stay asleep. Insomnia can cause fatigue, low energy, difficulty concentrating, mood swings, and poor performance at work or school. There are three different ways to classify insomnia: Difficulty falling asleep. Difficulty staying asleep. Waking up too early in the morning. Any type of insomnia can be long-term (chronic) or short-term (acute). Both are common. Short-term insomnia usually lasts for 3 months or less. Chronic insomnia occurs at least three times a week for longer than 3 months. What are the causes? Insomnia may be caused by another condition, situation, or substance, such as: Having certain mental health conditions, such as anxiety and depression. Using caffeine, alcohol, tobacco, or drugs. Having gastrointestinal conditions, such as gastroesophageal reflux disease (GERD). Having certain medical conditions. These include: Asthma. Alzheimer's disease. Stroke. Chronic pain. An overactive thyroid gland (hyperthyroidism). Other sleep disorders, such as restless legs syndrome and sleep apnea. Menopause. Sometimes, the cause of insomnia may not be known. What increases the risk? Risk factors for insomnia include: Gender. Females are affected more often than males. Age. Insomnia is more common as people get older. Stress and certain medical and mental health conditions. Lack of exercise. Having an irregular work schedule. This may include working night shifts and traveling between different time zones. What are the signs or symptoms? If you have insomnia, the main symptom is having trouble falling asleep or having trouble staying asleep. This may lead to other symptoms, such as: Feeling tired or having low energy. Feeling nervous about going to sleep. Not feeling rested in the morning. Having trouble concentrating. Feeling irritable, anxious, or depressed. How is this diagnosed? This condition  may be diagnosed based on: Your symptoms and medical history. Your health care provider may ask about: Your sleep habits. Any medical conditions you have. Your mental health. A physical exam. How is this treated? Treatment for insomnia depends on the cause. Treatment may focus on treating an underlying condition that is causing the insomnia. Treatment may also include: Medicines to help you sleep. Counseling or therapy. Lifestyle adjustments to help you sleep better. Follow these instructions at home: Eating and drinking  Limit or avoid alcohol, caffeinated beverages, and products that contain nicotine and tobacco, especially close to bedtime. These can disrupt your sleep. Do not eat a large meal or eat spicy foods right before bedtime. This can lead to digestive discomfort that can make it hard for you to sleep. Sleep habits  Keep a sleep diary to help you and your health care provider figure out what could be causing your insomnia. Write down: When you sleep. When you wake up during the night. How well you sleep and how rested you feel the next day. Any side effects of medicines you are taking. What you eat and drink. Make your bedroom a dark, comfortable place where it is easy to fall asleep. Put up shades or blackout curtains to block light from outside. Use a white noise machine to block noise. Keep the temperature cool. Limit screen use before bedtime. This includes: Not watching TV. Not using your smartphone, tablet, or computer. Stick to a routine that includes going to bed and waking up at the same times every day and night. This can help you fall asleep faster. Consider making a quiet activity, such as reading, part of your nighttime routine. Try to avoid taking naps during the day so that you sleep better at night. Get out of bed if you are still awake after   15 minutes of trying to sleep. Keep the lights down, but try reading or doing a quiet activity. When you feel  sleepy, go back to bed. General instructions Take over-the-counter and prescription medicines only as told by your health care provider. Exercise regularly as told by your health care provider. However, avoid exercising in the hours right before bedtime. Use relaxation techniques to manage stress. Ask your health care provider to suggest some techniques that may work well for you. These may include: Breathing exercises. Routines to release muscle tension. Visualizing peaceful scenes. Make sure that you drive carefully. Do not drive if you feel very sleepy. Keep all follow-up visits. This is important. Contact a health care provider if: You are tired throughout the day. You have trouble in your daily routine due to sleepiness. You continue to have sleep problems, or your sleep problems get worse. Get help right away if: You have thoughts about hurting yourself or someone else. Get help right away if you feel like you may hurt yourself or others, or have thoughts about taking your own life. Go to your nearest emergency room or: Call 911. Call the National Suicide Prevention Lifeline at 1-800-273-8255 or 988. This is open 24 hours a day. Text the Crisis Text Line at 741741. Summary Insomnia is a sleep disorder that makes it difficult to fall asleep or stay asleep. Insomnia can be long-term (chronic) or short-term (acute). Treatment for insomnia depends on the cause. Treatment may focus on treating an underlying condition that is causing the insomnia. Keep a sleep diary to help you and your health care provider figure out what could be causing your insomnia. This information is not intended to replace advice given to you by your health care provider. Make sure you discuss any questions you have with your health care provider. Document Revised: 04/11/2021 Document Reviewed: 04/11/2021 [employer] Patient Education  2023 [employer]  

## 2021-11-23 NOTE — Progress Notes (Signed)
BP 132/89   Pulse 97   Temp 98 F (36.7 C) (Oral)   Wt 157 lb 6.4 oz (71.4 kg)   SpO2 97%   BMI 26.60 kg/m    Subjective:    Patient ID: Jennifer Watts, female    DOB: 1996/03/05, 26 y.o.   MRN: [id number]  HPI: Jennifer Watts is a 26 y.o. female  Chief Complaint  Patient presents with   Vaginal Itching    Patient says she feels that the itching is on the inside and she said that she tried OTC Vagasil and when she used it, the medicine made everything feel as if it was on fire. Patient denies having tried OTC Monistat.    Vaginal Odor    Patient says her urine is very acidic when she urinates.    Contraception    Patient says she recently had an IUD placed, and she is wondering if the vaginal issues may be coming from the placement.    Insomnia    Patient is having issues with her sleep pattern and she would like to discuss with provider at today's visit. Patient says she is having more issues with falling asleep.    Nevus    Patient says she noticed a new mole under left arm and would like to discuss with provider at today's visit.    VAGINAL DISCHARGE She had an IUD placed by Dr. Dalbert Garnet 2 weeks ago and has noticed some itching and uncomfortable feeling recently. Duration: days Pruritus: yes Dysuria: no Malodorous: yes Urinary frequency: yes Fevers: no Abdominal pain:  occasional cramping   Sexual activity: monogamous History of sexually transmitted diseases: no Recent antibiotic use: no Context: stable  Treatments attempted: none   INSOMNIA She reports ongoing sleep issues, follows with Dr. Malvin Johns.  They have tried Trazodone, Seroquel, and Pamelor without benefit + takes Xanax and Benadryl at present.  Still struggles with sleep pattern. Duration: chronic Satisfied with sleep quality: yes Difficulty falling asleep: yes Difficulty staying asleep: no Waking a few hours after sleep onset:  occasionally Early morning awakenings: no Daytime hypersomnolence:  no Wakes feeling refreshed: yes Good sleep hygiene: yes Apnea: no Snoring: no Depressed/anxious mood: yes Recent stress: no Restless legs/nocturnal leg cramps: no Chronic pain/arthritis: no History of sleep study: no Treatments attempted: multiple medications + Melatonin  SKIN LESION Reports a nevus under left arm she would like provider to look at.  Sees dermatology in November next. Duration: days Location: under left arm Painful: no Itching: no Onset: sudden Context: not changing Associated signs and symptoms: none History of skin cancer: no History of precancerous skin lesions: no Family history of skin cancer: yes   Relevant past medical, surgical, family and social history reviewed and updated as indicated. Interim medical history since our last visit reviewed. Allergies and medications reviewed and updated.  Review of Systems  Constitutional:  Negative for activity change, appetite change, diaphoresis, fatigue and fever.  Respiratory:  Negative for cough, chest tightness and shortness of breath.   Cardiovascular:  Negative for chest pain, palpitations and leg swelling.  Gastrointestinal: Negative.   Genitourinary:  Positive for frequency and vaginal discharge. Negative for dysuria, hematuria and urgency.  Psychiatric/Behavioral:  Positive for sleep disturbance. Negative for decreased concentration, self-injury and suicidal ideas. The patient is not nervous/anxious.     Per HPI unless specifically indicated above     Objective:    BP 132/89   Pulse 97   Temp 98 F (36.7 C) (Oral)  Wt 157 lb 6.4 oz (71.4 kg)   SpO2 97%   BMI 26.60 kg/m   Wt Readings from Last 3 Encounters:  11/23/21 157 lb 6.4 oz (71.4 kg)  10/27/21 155 lb 6.4 oz (70.5 kg)  04/13/21 149 lb 12.8 oz (67.9 kg)    Physical Exam Vitals and nursing note reviewed.  Constitutional:      General: She is awake. She is not in acute distress.    Appearance: She is well-developed and well-groomed.  She is not ill-appearing.  HENT:     Head: Normocephalic.     Right Ear: Hearing normal.     Left Ear: Hearing normal.  Eyes:     General: Lids are normal.        Right eye: No discharge.        Left eye: No discharge.     Conjunctiva/sclera: Conjunctivae normal.     Pupils: Pupils are equal, round, and reactive to light.  Neck:     Thyroid: No thyromegaly.     Vascular: No carotid bruit.  Cardiovascular:     Rate and Rhythm: Normal rate and regular rhythm.     Heart sounds: Normal heart sounds. No murmur heard.    No gallop.  Pulmonary:     Effort: Pulmonary effort is normal. No accessory muscle usage or respiratory distress.     Breath sounds: Normal breath sounds.  Abdominal:     General: Bowel sounds are normal. There is no distension.     Palpations: Abdomen is soft.     Tenderness: There is no abdominal tenderness. There is no right CVA tenderness or left CVA tenderness.  Musculoskeletal:     Cervical back: Normal range of motion and neck supple.     Right lower leg: No edema.     Left lower leg: No edema.  Skin:    General: Skin is warm and dry.     Findings: Lesion present.       Neurological:     Mental Status: She is alert and oriented to person, place, and time.  Psychiatric:        Attention and Perception: Attention normal.        Mood and Affect: Mood normal.        Speech: Speech normal.        Behavior: Behavior normal. Behavior is cooperative.        Thought Content: Thought content normal.    Results for orders placed or performed in visit on 11/23/21  Microscopic Examination  Result Value Ref Range   WBC, UA None seen 0 - 5 /hpf   RBC, Urine 0-2 0 - 2 /hpf   Epithelial Cells (non renal) 0-10 0 - 10 /hpf   Bacteria, UA None seen None seen/Few  WET PREP FOR TRICH, YEAST, CLUE   Urine  Result Value Ref Range   Trichomonas Exam Negative Negative   Yeast Exam Negative Negative   Clue Cell Exam Negative Negative  Urinalysis, Routine w reflex  microscopic  Result Value Ref Range   Specific Gravity, UA 1.010 1.005 - 1.030   pH, UA 7.0 5.0 - 7.5   Color, UA Yellow Yellow   Appearance Ur Clear Clear   Leukocytes,UA Negative Negative   Protein,UA Negative Negative/Trace   Glucose, UA Negative Negative   Ketones, UA Negative Negative   RBC, UA Trace (A) Negative   Bilirubin, UA Negative Negative   Urobilinogen, Ur 0.2 0.2 - 1.0 mg/dL   Nitrite, UA  Negative Negative   Microscopic Examination See below:       Assessment & Plan:   Problem List Items Addressed This Visit       Other   Psychophysiological insomnia    Chronic, ongoing issue.  Has been followed by neurology on this and utilized multiple medications.  Currently taking Xanax and Benadryl, still not sleeping well.  Recommend therapy, CBT, to work on sleep issues and mental health.  Referral placed.  Discussed trial of Ashwagandha or Magnesium supplement at night to help with sleep.  Continue to collaborate with neurology.      Relevant Orders   Ambulatory referral to Psychology   Other Visit Diagnoses     Vaginal discharge    -  Primary   UA and wet prep overall showing no infection today.  Recommend she reach out to GYN for follow-up and IUD assessment.   Skin tag       Overall stable skin tag with mild irritation noted, monitor and remove as needed.  Recommend she not touch area.        Follow up plan: Return if symptoms worsen or fail to improve.

## 2021-12-05 ENCOUNTER — Other Ambulatory Visit: Payer: Self-pay | Admitting: Nurse Practitioner

## 2021-12-07 NOTE — Telephone Encounter (Signed)
Called and spoke to patient. Patient states she is not having symptoms. Will refuse RX.

## 2021-12-07 NOTE — Telephone Encounter (Signed)
Requested medication (s) are due for refill today: expired medication  Requested medication (s) are on the active medication list: no   Last refill:  na  Future visit scheduled: yes in 4  months   Notes to clinic:  expired medication 11/03/21. Medication not assigned to a protocol. Completed 7 days course. Do you want to refill.?     Requested Prescriptions  Pending Prescriptions Disp Refills   metroNIDAZOLE (METROGEL) 0.75 % vaginal gel [Pharmacy Med Name: METRONIDAZOLE VAGINAL 0.75% GL] 140 g 0    Sig: Place 1 Applicatorful vaginally 2 (two) times daily for 7 days.     Off-Protocol Failed - 12/05/2021  5:44 PM      Failed - Medication not assigned to a protocol, review manually.      Passed - Valid encounter within last 12 months    Recent Outpatient Visits           2 weeks ago Vaginal discharge   [facility] [place], [place] T, NP   1 month ago BV (bacterial vaginosis)   [facility] [place], Corrie Dandy T, NP   5 months ago Sore throat   [facility] [place], Dorie Rank, NP   7 months ago Multiple sclerosis (HCC)   [facility] [place], [place] T, NP   1 year ago Mild episode of recurrent major depressive disorder (HCC)   [facility] Marjie Skiff, NP       Future Appointments             In 4 months Willeen Niece, MD  [facility]   In 4 months Cannady, Dorie Rank, NP [facility]

## 2021-12-09 ENCOUNTER — Telehealth: Payer: Medicaid Other | Admitting: Nurse Practitioner

## 2022-01-06 ENCOUNTER — Other Ambulatory Visit: Payer: Self-pay | Admitting: Nurse Practitioner

## 2022-01-06 NOTE — Telephone Encounter (Signed)
Requested Prescriptions  Pending Prescriptions Disp Refills  . sertraline (ZOLOFT) 50 MG tablet [Pharmacy Med Name: SERTRALINE HCL 50 MG TABLET] 90 tablet 0    Sig: TAKE 1 TABLET BY MOUTH EVERY DAY     Psychiatry:  Antidepressants - SSRI - sertraline Passed - 01/06/2022  2:43 PM      Passed - AST in normal range and within 360 days    AST  Date Value Ref Range Status  04/13/2021 24 0 - 40 IU/L Final   SGOT(AST)  Date Value Ref Range Status  05/09/2014 17 0 - 26 Unit/L Final         Passed - ALT in normal range and within 360 days    ALT  Date Value Ref Range Status  04/13/2021 30 0 - 32 IU/L Final   SGPT (ALT)  Date Value Ref Range Status  05/09/2014 24 U/L Final    Comment:    14-63 NOTE: New Reference Range 12/02/13          Passed - Completed PHQ-2 or PHQ-9 in the last 360 days      Passed - Valid encounter within last 6 months    Recent Outpatient Visits          1 month ago Vaginal discharge   [facility] [place], Jolene T, NP   2 months ago BV (bacterial vaginosis)   [facility] [place], Corrie Dandy T, NP   6 months ago Sore throat   [facility] [place], Corrie Dandy T, NP   8 months ago Multiple sclerosis (HCC)   [facility] [place], [place] T, NP   1 year ago Mild episode of recurrent major depressive disorder (HCC)   [facility] Marjie Skiff, NP      Future Appointments            In 3 months Willeen Niece, MD [facility]   In 3 months Cannady, Dorie Rank, NP [facility]

## 2022-01-10 ENCOUNTER — Other Ambulatory Visit: Payer: Self-pay | Admitting: Nurse Practitioner

## 2022-01-10 ENCOUNTER — Telehealth: Payer: Self-pay

## 2022-01-10 NOTE — Telephone Encounter (Signed)
Patient called stating that she had been having a rash all over her body for the last 1.5 weeks. She stated that she called her neurologist to him know as well since she has MS. She thought that maybe one of her medications was the cause. However, the neurologist told her that she needed to contact Dr. Tobi Bastos since she is taking Omeprazole 40 MG daily and maybe now she is having a reaction to it. Patient would like to know what to do. Please advise.

## 2022-01-11 NOTE — Telephone Encounter (Signed)
Called patient and she did not answer. I left her a detailed message with Dr. Johnney Killian recommendation and if she had further questions, to please give [place] a call. I also sent her a MyChart message.

## 2022-01-11 NOTE — Telephone Encounter (Signed)
Requested medication (s) are due for refill today: no   Requested medication (s) are on the active medication list: yes  Last refill:  01/06/22  Future visit scheduled: yes  Notes to clinic:  Unable to refill per protocol, last refill by  provider 01/06/22 for 90 days,possible duplicate request.     Requested Prescriptions  Pending Prescriptions Disp Refills   sertraline (ZOLOFT) 50 MG tablet [Pharmacy Med Name: SERTRALINE HCL 50 MG TABLET] 90 tablet 0    Sig: TAKE 1 TABLET BY MOUTH EVERY DAY     Psychiatry:  Antidepressants - SSRI - sertraline Passed - 01/10/2022  4:54 PM      Passed - AST in normal range and within 360 days    AST  Date Value Ref Range Status  04/13/2021 24 0 - 40 IU/L Final   SGOT(AST)  Date Value Ref Range Status  05/09/2014 17 0 - 26 Unit/L Final         Passed - ALT in normal range and within 360 days    ALT  Date Value Ref Range Status  04/13/2021 30 0 - 32 IU/L Final   SGPT (ALT)  Date Value Ref Range Status  05/09/2014 24 U/L Final    Comment:    14-63 NOTE: New Reference Range 12/02/13          Passed - Completed PHQ-2 or PHQ-9 in the last 360 days      Passed - Valid encounter within last 6 months    Recent Outpatient Visits           1 month ago Vaginal discharge   [facility] [place], Jolene T, NP   2 months ago BV (bacterial vaginosis)   [facility] [place], Corrie Dandy T, NP   7 months ago Sore throat   [facility] [place], Corrie Dandy T, NP   9 months ago Multiple sclerosis (HCC)   [facility] [place], [place] T, NP   1 year ago Mild episode of recurrent major depressive disorder (HCC)   [facility] Marjie Skiff, NP       Future Appointments             In 2 months Willeen Niece, MD [facility]   In 3 months Cannady, Dorie Rank, NP [facility]

## 2022-01-12 ENCOUNTER — Encounter: Payer: Self-pay | Admitting: Nurse Practitioner

## 2022-01-17 LAB — HM PAP SMEAR

## 2022-01-19 ENCOUNTER — Other Ambulatory Visit: Payer: Self-pay | Admitting: Gastroenterology

## 2022-01-19 ENCOUNTER — Other Ambulatory Visit: Payer: Self-pay

## 2022-01-19 MED ORDER — OMEPRAZOLE 20 MG PO CPDR: 20.0000 mg | DELAYED_RELEASE_CAPSULE | Freq: Every day | ORAL | 3 refills | Status: DC

## 2022-01-27 ENCOUNTER — Other Ambulatory Visit: Payer: Self-pay | Admitting: Nurse Practitioner

## 2022-01-27 ENCOUNTER — Other Ambulatory Visit: Payer: Self-pay | Admitting: Gastroenterology

## 2022-01-30 ENCOUNTER — Other Ambulatory Visit: Payer: Self-pay

## 2022-01-30 MED ORDER — OMEPRAZOLE 20 MG PO CPDR: 20.0000 mg | DELAYED_RELEASE_CAPSULE | Freq: Every day | ORAL | 3 refills | Status: DC

## 2022-01-30 NOTE — Telephone Encounter (Signed)
Unable to refill per protocol, request is too soon.Last RF was 01/06/22 for 90 days.E-Prescribing Status: Receipt confirmed by pharmacy (01/06/2022 3:21 PM). Will refuse request.  Requested Prescriptions  Pending Prescriptions Disp Refills  . sertraline (ZOLOFT) 50 MG tablet [Pharmacy Med Name: SERTRALINE HCL 50 MG TABLET] 90 tablet 0    Sig: TAKE 1 TABLET BY MOUTH EVERY DAY     Psychiatry:  Antidepressants - SSRI - sertraline Passed - 01/27/2022  1:38 PM      Passed - AST in normal range and within 360 days    AST  Date Value Ref Range Status  04/13/2021 24 0 - 40 IU/L Final   SGOT(AST)  Date Value Ref Range Status  05/09/2014 17 0 - 26 Unit/L Final         Passed - ALT in normal range and within 360 days    ALT  Date Value Ref Range Status  04/13/2021 30 0 - 32 IU/L Final   SGPT (ALT)  Date Value Ref Range Status  05/09/2014 24 U/L Final    Comment:    14-63 NOTE: New Reference Range 12/02/13          Passed - Completed PHQ-2 or PHQ-9 in the last 360 days      Passed - Valid encounter within last 6 months    Recent Outpatient Visits          2 months ago Vaginal discharge   [place], Jolene T, NP   3 months ago BV (bacterial vaginosis)   [place], Henrine Screws T, NP   7 months ago Sore throat   [place], Henrine Screws T, NP   9 months ago Multiple sclerosis ([place])   [place], [place] T, NP   1 year ago Mild episode of recurrent major depressive disorder ([place])   [place], Jolene T, NP      Future Appointments            In 2 months Brendolyn Patty, MD [place]   In 3 months Cannady, Barbaraann Faster, NP [facility]

## 2022-01-31 ENCOUNTER — Other Ambulatory Visit: Payer: Self-pay

## 2022-01-31 MED ORDER — OMEPRAZOLE 20 MG PO CPDR: 20.0000 mg | DELAYED_RELEASE_CAPSULE | Freq: Two times a day (BID) | ORAL | 3 refills | Status: DC

## 2022-02-18 ENCOUNTER — Other Ambulatory Visit: Payer: Self-pay | Admitting: Nurse Practitioner

## 2022-02-20 NOTE — Telephone Encounter (Signed)
Refilled 01/06/2022 #[id number] rf. Requested Prescriptions  Pending Prescriptions Disp Refills  . sertraline (ZOLOFT) 50 MG tablet [Pharmacy Med Name: SERTRALINE HCL 50 MG TABLET] 90 tablet 0    Sig: TAKE 1 TABLET BY MOUTH EVERY DAY     Psychiatry:  Antidepressants - SSRI - sertraline Passed - 02/18/2022  2:17 PM      Passed - AST in normal range and within 360 days    AST  Date Value Ref Range Status  04/13/2021 24 0 - 40 IU/L Final   SGOT(AST)  Date Value Ref Range Status  05/09/2014 17 0 - 26 Unit/L Final         Passed - ALT in normal range and within 360 days    ALT  Date Value Ref Range Status  04/13/2021 30 0 - 32 IU/L Final   SGPT (ALT)  Date Value Ref Range Status  05/09/2014 24 U/L Final    Comment:    14-63 NOTE: New Reference Range 12/02/13          Passed - Completed PHQ-2 or PHQ-9 in the last 360 days      Passed - Valid encounter within last 6 months    Recent Outpatient Visits          2 months ago Vaginal discharge   [place], Jolene T, NP   3 months ago BV (bacterial vaginosis)   [place], Henrine Screws T, NP   8 months ago Sore throat   [place], Henrine Screws T, NP   10 months ago Multiple sclerosis ([place])   [place], [place] T, NP   1 year ago Mild episode of recurrent major depressive disorder ([place])   [place], Jolene T, NP      Future Appointments            In 1 month Brendolyn Patty, MD [place]   In 2 months Cannady, Barbaraann Faster, NP [facility]

## 2022-03-12 ENCOUNTER — Encounter: Payer: Self-pay | Admitting: Nurse Practitioner

## 2022-03-23 ENCOUNTER — Encounter: Payer: Self-pay | Admitting: Nurse Practitioner

## 2022-03-25 NOTE — Patient Instructions (Incomplete)
Sore Throat When you have a sore throat, your throat may feel: Tender. Burning. Irritated. Scratchy. Painful when you swallow. Painful when you talk. Many things can cause a sore throat, such as: An infection. Allergies. Dry air. Smoke or pollution. Radiation treatment for cancer. Gastroesophageal reflux disease (GERD). A tumor. A sore throat can be the first sign of another sickness. It can happen with other problems, like: Coughing. Sneezing. Fever. Swelling of the glands in the neck. Most sore throats go away without treatment. Follow these instructions at home:     Medicines Take over-the-counter and prescription medicines only as told by your doctor. Children often get sore throats. Do not give your child aspirin. Use throat sprays to soothe your throat as told by your health care provider. Managing pain To help with pain: Sip warm liquids, such as broth, herbal tea, or warm water. Eat or drink cold or frozen liquids, such as frozen ice pops. Rinse your mouth (gargle) with a salt water mixture 3-4 times a day or as needed. To make salt water, dissolve -1 tsp (3-6 g) of salt in 1 cup (237 mL) of warm water. Do not swallow this mixture. Suck on hard candy or throat lozenges. Put a cool-mist humidifier in your bedroom at night. Sit in the bathroom with the door closed for 5-10 minutes while you run hot water in the shower. General instructions Do not smoke or use any products that contain nicotine or tobacco. If you need help quitting, ask your doctor. Get plenty of rest. Drink enough fluid to keep your pee (urine) pale yellow. Wash your hands often for at least 20 seconds with soap and water. If soap and water are not available, use hand sanitizer. Contact a doctor if: You have a fever for more than 2-3 days. You keep having symptoms for more than 2-3 days. Your throat does not get better in 7 days. You have a fever and your symptoms suddenly get worse. Your  child who is 3 months to 3 years old has a temperature of 102.2F (39C) or higher. Get help right away if: You have trouble breathing. You cannot swallow fluids, soft foods, or your spit. You have swelling in your throat or neck that gets worse. You feel like you may vomit (nauseous) and this feeling lasts a long time. You cannot stop vomiting. These symptoms may be an emergency. Get help right away. Call your local emergency services (911 in the [place] Do not wait to see if the symptoms will go away. Do not drive yourself to the hospital. Summary A sore throat is a painful, burning, irritated, or scratchy throat. Many things can cause a sore throat. Take over-the-counter medicines only as told by your doctor. Get plenty of rest. Drink enough fluid to keep your pee (urine) pale yellow. Contact a doctor if your symptoms get worse or your sore throat does not get better within 7 days. This information is not intended to replace advice given to you by your health care provider. Make sure you discuss any questions you have with your health care provider. Document Revised: 07/28/2020 Document Reviewed: 07/28/2020 Elsevier Patient Education  2023 [employer]  

## 2022-03-27 ENCOUNTER — Encounter: Payer: Self-pay | Admitting: Nurse Practitioner

## 2022-03-27 ENCOUNTER — Ambulatory Visit: Payer: Medicaid Other | Admitting: Nurse Practitioner

## 2022-03-27 ENCOUNTER — Telehealth (INDEPENDENT_AMBULATORY_CARE_PROVIDER_SITE_OTHER): Payer: Medicaid Other | Admitting: Nurse Practitioner

## 2022-03-27 DIAGNOSIS — J029 Acute pharyngitis, unspecified: Secondary | ICD-10-CM | POA: Diagnosis not present

## 2022-03-27 NOTE — Telephone Encounter (Signed)
Pt rescheduled

## 2022-03-27 NOTE — Assessment & Plan Note (Signed)
Acute and improving.  Recommend she use Viscous lidocaine and Orajel at home as needed.  Continue salt water gargles.  Monitor and if worsening return to office.

## 2022-03-27 NOTE — Progress Notes (Signed)
There were no vitals taken for this visit.   Subjective:    Patient ID: Jennifer Watts, female    DOB: 1996-01-29, 26 y.o.   MRN: [id number]  HPI: Jennifer Watts is a 26 y.o. female  Chief Complaint  Patient presents with   Sore Throat    Started about a week ago and has been feeling better the past 2 days, Patient also states that she has 2 ulcers in her mouth, noticed in the last week     This visit was completed via video visit through MyChart due to the restrictions of the COVID-19 pandemic. All issues as above were discussed and addressed. Physical exam was done as above through visual confirmation on video through MyChart. If it was felt that the patient should be evaluated in the office, they were directed there. The patient verbally consented to this visit. Location of the patient: home Location of the provider: work Those involved with this call:  Provider: Aura Dials, DNP CMA: Tristan Schroeder, CMA Front Desk/Registration: [employer]  Time spent on call:  21 minutes with patient face to face via video conference. More than 50% of this time was spent in counseling and coordination of care. 15 minutes total spent in review of patient's record and preparation of their chart.  I verified patient identity using two factors (patient name and date of birth). Patient consents verbally to being seen via telemedicine visit today.    UPPER RESPIRATORY TRACT INFECTION Had a sore throat for one week, which is improving.  Has ulcer on back of mouth and on tongue - they are painful but improving.  Sleeps with fan on and this was causing dry throat she thinks.  Has viscous lidocaine and Orajel at home.  Having increased fatigue over past 2 weeks.  Goes to bed around 3 to 4 am, gets up at 12 pm to 1 pm.  Is napping on occasion during day.  Has difficulty sleeping at night.  Is taking Melatonin + Benadryl. Fever: no Cough: no Shortness of breath: no Wheezing: no Chest pain: yes,  with cough Chest tightness: no Chest congestion: no Nasal congestion: yes Runny nose: yes Post nasal drip: yes Sneezing: no Sore throat:  improving Swollen glands: no Sinus pressure: no Headache:  at baseline with migraines Face pain: no Toothache: no Ear pain: none Ear pressure: yes bilateral Eyes red/itching:no Eye drainage/crusting: no  Vomiting: no Rash: no Fatigue: yes Sick contacts: yes Strep contacts: no  Context: better Recurrent sinusitis: no Relief with OTC cold/cough medications: yes  Treatments attempted: Tylenol      03/27/2022    4:17 PM 11/23/2021    4:19 PM 10/27/2021   11:37 AM 04/13/2021    3:42 PM 09/29/2020    9:59 AM  Depression screen PHQ 2/9  Decreased Interest 2 0 1 2 0  Down, Depressed, Hopeless 0 0 0 0 0  PHQ - 2 Score 2 0 1 2 0  Altered sleeping 3 2 2 3 2   Tired, decreased energy 2 0 1 2 0  Change in appetite 0 0 0 0 0  Feeling bad or failure about yourself  0 0 0 0 0  Trouble concentrating 0 0 0 0 0  Moving slowly or fidgety/restless 0 0 0 1 0  Suicidal thoughts 0 0 0 0 0  PHQ-9 Score 7 2 4 8 2   Difficult doing work/chores Somewhat difficult Not difficult at all  Somewhat difficult  03/27/2022    4:18 PM 11/23/2021    4:20 PM 10/27/2021   11:37 AM 04/13/2021    3:42 PM  GAD 7 : Generalized Anxiety Score  Nervous, Anxious, on Edge 1 1 1 1   Control/stop worrying 0 0 0 0  Worry too much - different things 1 0 0 0  Trouble relaxing 1 0 0 1  Restless 0 0 0 0  Easily annoyed or irritable 0 0 0 0  Afraid - awful might happen 0 0 0 0  Total GAD 7 Score 3 1 1 2   Anxiety Difficulty Not difficult at all Not difficult at all  Not difficult at all   Relevant past medical, surgical, family and social history reviewed and updated as indicated. Interim medical history since our last visit reviewed. Allergies and medications reviewed and updated.  Review of Systems  Constitutional:  Positive for fatigue. Negative for activity change,  appetite change, diaphoresis and fever.  HENT:  Positive for congestion, postnasal drip, rhinorrhea and sore throat. Negative for nosebleeds, sinus pressure, sinus pain, sneezing, trouble swallowing and voice change.   Respiratory:  Negative for cough, chest tightness and shortness of breath.   Cardiovascular:  Negative for chest pain, palpitations and leg swelling.  Gastrointestinal: Negative.   Neurological: Negative.   Psychiatric/Behavioral: Negative.      Per HPI unless specifically indicated above     Objective:    There were no vitals taken for this visit.  Wt Readings from Last 3 Encounters:  11/23/21 157 lb 6.4 oz (71.4 kg)  10/27/21 155 lb 6.4 oz (70.5 kg)  04/13/21 149 lb 12.8 oz (67.9 kg)    Physical Exam Vitals and nursing note reviewed.  Constitutional:      General: She is awake. She is not in acute distress.    Appearance: She is well-developed. She is not ill-appearing or toxic-appearing.  HENT:     Head: Normocephalic.     Right Ear: Hearing normal.     Left Ear: Hearing normal.  Eyes:     General: Lids are normal.        Right eye: No discharge.        Left eye: No discharge.     Conjunctiva/sclera: Conjunctivae normal.  Pulmonary:     Effort: Pulmonary effort is normal. No accessory muscle usage or respiratory distress.  Musculoskeletal:     Cervical back: Normal range of motion.  Neurological:     Mental Status: She is alert and oriented to person, place, and time.  Psychiatric:        Attention and Perception: Attention normal.        Mood and Affect: Mood normal.        Behavior: Behavior normal. Behavior is cooperative.        Thought Content: Thought content normal.        Judgment: Judgment normal.     Results for orders placed or performed in visit on 11/23/21  Microscopic Examination  Result Value Ref Range   WBC, UA None seen 0 - 5 /hpf   RBC, Urine 0-2 0 - 2 /hpf   Epithelial Cells (non renal) 0-10 0 - 10 /hpf   Bacteria, UA None  seen None seen/Few  WET PREP FOR TRICH, YEAST, CLUE   Urine  Result Value Ref Range   Trichomonas Exam Negative Negative   Yeast Exam Negative Negative   Clue Cell Exam Negative Negative  Urinalysis, Routine w reflex microscopic  Result Value Ref Range  Specific Gravity, UA 1.010 1.005 - 1.030   pH, UA 7.0 5.0 - 7.5   Color, UA Yellow Yellow   Appearance Ur Clear Clear   Leukocytes,UA Negative Negative   Protein,UA Negative Negative/Trace   Glucose, UA Negative Negative   Ketones, UA Negative Negative   RBC, UA Trace (A) Negative   Bilirubin, UA Negative Negative   Urobilinogen, Ur 0.2 0.2 - 1.0 mg/dL   Nitrite, UA Negative Negative   Microscopic Examination See below:       Assessment & Plan:   Problem List Items Addressed This Visit       Other   Sore throat - Primary    Acute and improving.  Recommend she use Viscous lidocaine and Orajel at home as needed.  Continue salt water gargles.  Monitor and if worsening return to office.       I discussed the assessment and treatment plan with the patient. The patient was provided an opportunity to ask questions and all were answered. The patient agreed with the plan and demonstrated an understanding of the instructions.   The patient was advised to call back or seek an in-person evaluation if the symptoms worsen or if the condition fails to improve as anticipated.   I provided 21+ minutes of time during this encounter.    Follow up plan: Return if symptoms worsen or fail to improve.

## 2022-03-27 NOTE — Patient Instructions (Signed)
Sore Throat When you have a sore throat, your throat may feel: Tender. Burning. Irritated. Scratchy. Painful when you swallow. Painful when you talk. Many things can cause a sore throat, such as: An infection. Allergies. Dry air. Smoke or pollution. Radiation treatment for cancer. Gastroesophageal reflux disease (GERD). A tumor. A sore throat can be the first sign of another sickness. It can happen with other problems, like: Coughing. Sneezing. Fever. Swelling of the glands in the neck. Most sore throats go away without treatment. Follow these instructions at home:     Medicines Take over-the-counter and prescription medicines only as told by your doctor. Children often get sore throats. Do not give your child aspirin. Use throat sprays to soothe your throat as told by your health care provider. Managing pain To help with pain: Sip warm liquids, such as broth, herbal tea, or warm water. Eat or drink cold or frozen liquids, such as frozen ice pops. Rinse your mouth (gargle) with a salt water mixture 3-4 times a day or as needed. To make salt water, dissolve -1 tsp (3-6 g) of salt in 1 cup (237 mL) of warm water. Do not swallow this mixture. Suck on hard candy or throat lozenges. Put a cool-mist humidifier in your bedroom at night. Sit in the bathroom with the door closed for 5-10 minutes while you run hot water in the shower. General instructions Do not smoke or use any products that contain nicotine or tobacco. If you need help quitting, ask your doctor. Get plenty of rest. Drink enough fluid to keep your pee (urine) pale yellow. Wash your hands often for at least 20 seconds with soap and water. If soap and water are not available, use hand sanitizer. Contact a doctor if: You have a fever for more than 2-3 days. You keep having symptoms for more than 2-3 days. Your throat does not get better in 7 days. You have a fever and your symptoms suddenly get worse. Your  child who is 3 months to 3 years old has a temperature of 102.2F (39C) or higher. Get help right away if: You have trouble breathing. You cannot swallow fluids, soft foods, or your spit. You have swelling in your throat or neck that gets worse. You feel like you may vomit (nauseous) and this feeling lasts a long time. You cannot stop vomiting. These symptoms may be an emergency. Get help right away. Call your local emergency services (911 in the [place] Do not wait to see if the symptoms will go away. Do not drive yourself to the hospital. Summary A sore throat is a painful, burning, irritated, or scratchy throat. Many things can cause a sore throat. Take over-the-counter medicines only as told by your doctor. Get plenty of rest. Drink enough fluid to keep your pee (urine) pale yellow. Contact a doctor if your symptoms get worse or your sore throat does not get better within 7 days. This information is not intended to replace advice given to you by your health care provider. Make sure you discuss any questions you have with your health care provider. Document Revised: 07/28/2020 Document Reviewed: 07/28/2020 Elsevier Patient Education  2023 [employer]  

## 2022-04-10 ENCOUNTER — Ambulatory Visit: Payer: Medicaid Other | Admitting: Dermatology

## 2022-04-10 ENCOUNTER — Encounter: Payer: Self-pay | Admitting: Nurse Practitioner

## 2022-04-30 DIAGNOSIS — Z975 Presence of (intrauterine) contraceptive device: Secondary | ICD-10-CM | POA: Insufficient documentation

## 2022-04-30 NOTE — Patient Instructions (Signed)

## 2022-05-02 ENCOUNTER — Encounter: Payer: Self-pay | Admitting: Nurse Practitioner

## 2022-05-02 ENCOUNTER — Ambulatory Visit (INDEPENDENT_AMBULATORY_CARE_PROVIDER_SITE_OTHER): Payer: Medicaid Other | Admitting: Nurse Practitioner

## 2022-05-02 VITALS — BP 128/80 | HR 92 | Temp 98.3°F | Ht 64.49 in | Wt 148.5 lb

## 2022-05-02 DIAGNOSIS — Z23 Encounter for immunization: Secondary | ICD-10-CM

## 2022-05-02 DIAGNOSIS — G43009 Migraine without aura, not intractable, without status migrainosus: Secondary | ICD-10-CM

## 2022-05-02 DIAGNOSIS — N644 Mastodynia: Secondary | ICD-10-CM

## 2022-05-02 DIAGNOSIS — G35 Multiple sclerosis: Secondary | ICD-10-CM

## 2022-05-02 DIAGNOSIS — R12 Heartburn: Secondary | ICD-10-CM

## 2022-05-02 DIAGNOSIS — Z118 Encounter for screening for other infectious and parasitic diseases: Secondary | ICD-10-CM

## 2022-05-02 DIAGNOSIS — Z13220 Encounter for screening for lipoid disorders: Secondary | ICD-10-CM

## 2022-05-02 DIAGNOSIS — Z136 Encounter for screening for cardiovascular disorders: Secondary | ICD-10-CM

## 2022-05-02 DIAGNOSIS — E559 Vitamin D deficiency, unspecified: Secondary | ICD-10-CM

## 2022-05-02 DIAGNOSIS — F17210 Nicotine dependence, cigarettes, uncomplicated: Secondary | ICD-10-CM

## 2022-05-02 DIAGNOSIS — E538 Deficiency of other specified B group vitamins: Secondary | ICD-10-CM

## 2022-05-02 DIAGNOSIS — Z975 Presence of (intrauterine) contraceptive device: Secondary | ICD-10-CM

## 2022-05-02 DIAGNOSIS — F419 Anxiety disorder, unspecified: Secondary | ICD-10-CM

## 2022-05-02 DIAGNOSIS — F5104 Psychophysiologic insomnia: Secondary | ICD-10-CM

## 2022-05-02 DIAGNOSIS — F330 Major depressive disorder, recurrent, mild: Secondary | ICD-10-CM

## 2022-05-02 DIAGNOSIS — Z114 Encounter for screening for human immunodeficiency virus [HIV]: Secondary | ICD-10-CM

## 2022-05-02 DIAGNOSIS — Z0000 Encounter for general adult medical examination without abnormal findings: Secondary | ICD-10-CM

## 2022-05-02 MED ORDER — SERTRALINE HCL 50 MG PO TABS: 50.0000 mg | ORAL_TABLET | Freq: Every day | ORAL | 4 refills | Status: DC

## 2022-05-02 MED ORDER — OMEPRAZOLE 20 MG PO CPDR: 20.0000 mg | DELAYED_RELEASE_CAPSULE | Freq: Every day | ORAL | 4 refills | Status: DC

## 2022-05-02 MED ORDER — LINACLOTIDE 290 MCG PO CAPS: 290.0000 ug | ORAL_CAPSULE | Freq: Every day | ORAL | 4 refills | Status: DC

## 2022-05-02 NOTE — Assessment & Plan Note (Addendum)
Chronic, stable.  Continue Omeprazole & Linzess and collaboration with GI provider on as needed basis.  Mag level today.  Risks of PPI use were discussed with patient including bone loss, C. Diff diarrhea, pneumonia, infections, CKD, electrolyte abnormalities.Verbalizes understanding and chooses to continue the medication.

## 2022-05-02 NOTE — Assessment & Plan Note (Signed)
Continue collaboration with GYN. 

## 2022-05-02 NOTE — Assessment & Plan Note (Signed)
Chronic, ongoing.  Continue to collaborate with neurology team, recent notes reviewed.  She is concerned for lupus based on family history and her recent mouth ulcers frequently, increased fatigue, and rashes to face intermittent.  Will check ANA, prote electro, CRP, ESR, Antiphospholipid, and C3/C4 today + B12 (has not been taking her B12).  If any abnormal consider rheumatology referral.

## 2022-05-02 NOTE — Assessment & Plan Note (Signed)
Chronic, ongoing.  Is not taking supplement, recommend she restart this. Vitamin B12 level today.

## 2022-05-02 NOTE — Assessment & Plan Note (Signed)
Chronic, ongoing.  No red flag symptoms today.  Followed by neurology, continue current medication regimen as prescribed by them.  Had benefit from Nurtec, but this is not covered by insurance.

## 2022-05-02 NOTE — Assessment & Plan Note (Signed)
Chronic, ongoing.  Is not taking supplement, recommend she restart this. Vitamin D level today.

## 2022-05-02 NOTE — Progress Notes (Signed)
BP 128/80   Pulse 92   Temp 98.3 F (36.8 C) (Oral)   Ht 5' 4.49" (1.638 m)   Wt 148 lb 8 oz (67.4 kg)   SpO2 99%   BMI 25.11 kg/m    Subjective:    Patient ID: Jennifer Watts, female    DOB: Dec 28, 1995, 26 y.o.   MRN: [id number]  HPI: Jennifer Watts is a 26 y.o. female presenting on 05/02/2022 for comprehensive medical examination. Current medical complaints include:  - her mom would like her tested for Lupus due to ulcers in mouth repetitive and rash appearances to face. - would like right breast looked at due to discomfort  She currently lives with: fiance  GERD Continues on Omeprazole daily.  Last saw GI 01/04/21 -- PCP to takeover refills.  No further heart burn. GERD control status: stable Satisfied with current treatment? yes Heartburn frequency: none Medication side effects: no  Medication compliance: stable Dysphagia: no Odynophagia:  no Hematemesis: no Blood in stool: no EGD: no   MULTIPLE SCLEROSIS: Followed by neurology, last saw on 04/24/22.  Currently not taking treatment due to side effects.  Report she feels very fatigue and having the occasional mouth ulcers + rashes to face.  Denies any changes in function.  No recent falls or injuries.  Currently is not taking B12 shots + taking Vitamin D for history of low levels.  B12 level on 04/13/21 was 1874.  MIGRAINES Followed by neurology.  Continues on Imitrex, previously took Boyd which did not really help.  Nurtec offered benefit, but insurance stopped covering.  Duration: years Onset: gradual Headache status at time of visit: mild Aura: yes Nausea:  yes Vomiting: no Photophobia:  yes Phonophobia:  no Effect on social functioning:  no Numbers of missed days of school/work each month: none at this time Confusion:  no Gait disturbance/ataxia:  no Behavioral changes:  no Fevers:  no   DEPRESSION Continues on Zoloft 50 MG daily for mood.  Continues Xanax, ordered by neurology, often taking one in  morning and one before bed.   Mood status: stable Satisfied with current treatment?: yes Symptom severity: mild  Duration of current treatment : chronic Side effects: no Medication compliance: good compliance Psychotherapy/counseling: none Depressed mood: no Anxious mood: no Anhedonia: no Significant weight loss or gain: no Insomnia: yes hard to fall asleep Fatigue: no Feelings of worthlessness or guilt: no Impaired concentration/indecisiveness: yes Suicidal ideations: no Hopelessness: no Crying spells: no    05/02/2022   11:11 AM 03/27/2022    4:17 PM 11/23/2021    4:19 PM 10/27/2021   11:37 AM 04/13/2021    3:42 PM  Depression screen PHQ 2/9  Decreased Interest 2 2 0 1 2  Down, Depressed, Hopeless 0 0 0 0 0  PHQ - 2 Score 2 2 0 1 2  Altered sleeping _0 Tired, decreased energy 2 2 0 1 2  Change in appetite 0 0 0 0 0  Feeling bad or failure about yourself  0 0 0 0 0  Trouble concentrating 1 0 0 0 0  Moving slowly or fidgety/restless 0 0 0 0 1  Suicidal thoughts 0 0 0 0 0  PHQ-9 Score _1 Difficult doing work/chores Not difficult at all Somewhat difficult Not difficult at all  Somewhat difficult      05/02/2022   11:11 AM 03/27/2022    4:18 PM 11/23/2021    4:20 PM 10/27/2021  11:37 AM  GAD 7 : Generalized Anxiety Score  Nervous, Anxious, on Edge _0 Control/stop worrying 0 0 0 0  Worry too much - different things 0 1 0 0  Trouble relaxing 1 1 0 0  Restless 0 0 0 0  Easily annoyed or irritable 0 0 0 0  Afraid - awful might happen 0 0 0 0  Total GAD 7 Score _1 Anxiety Difficulty Not difficult at all Not difficult at all Not difficult at all     Past Medical History:  Past Medical History:  Diagnosis Date   Anxiety    Depression    Migraines    MS (multiple sclerosis) ([place])    Neuromuscular disorder ([place])     Surgical History:  Past Surgical History:  Procedure Laterality Date   TYMPANOSTOMY TUBE PLACEMENT      Medications:   Current Outpatient Medications on File Prior to Visit  Medication Sig   ALPRAZolam (XANAX) 0.5 MG tablet Take 0.5 mg by mouth at bedtime as needed for anxiety.   cholecalciferol (VITAMIN D3) 25 MCG (1000 UT) tablet Take 1,000 Units by mouth daily.   clobetasol cream (TEMOVATE) 0.05 % APPLY TO AFFECTED AREA TWICE A DAY   cyanocobalamin (,VITAMIN B-12,) 1000 MCG/ML injection Inject into the muscle.   Cyanocobalamin (B-12 PO) Take 1 tablet by mouth daily.   NURTEC 75 MG TBDP Take 1 tablet by mouth every other day.   SUMAtriptan (IMITREX) 100 MG tablet Take 50-100 mg by mouth daily as needed.   Current Facility-Administered Medications on File Prior to Visit  Medication   acetaminophen (TYLENOL) tablet 1,000 mg    Allergies:  Allergies  Allergen Reactions   Reglan [Metoclopramide] Other (See Comments)    Hyperactivity /becomes mean   Ibuprofen Rash   Penicillins Rash    Has patient had a PCN reaction causing immediate rash, facial/tongue/throat swelling, SOB or lightheadedness with hypotension: No Has patient had a PCN reaction causing severe rash involving mucus membranes or skin necrosis: No Has patient had a PCN reaction that required hospitalization: No Has patient had a PCN reaction occurring within the last 10 years: No If all of the above answers are "NO", then may proceed with Cephalosporin use.    Sulfa Antibiotics Rash   Sulfasalazine Rash    Social History:  Social History   Socioeconomic History   Marital status: Single    Spouse name: Not on file   Number of children: Not on file   Years of education: Not on file   Highest education level: Not on file  Occupational History   Not on file  Tobacco Use   Smoking status: Former    Packs/day: 0.25    Types: Cigarettes    Quit date: 01/09/2020    Years since quitting: 2.3   Smokeless tobacco: Never  Vaping Use   Vaping Use: Former  Substance and Sexual Activity   Alcohol use: Yes    Comment: social   Drug  use: No   Sexual activity: Yes    Birth control/protection: Injection  Other Topics Concern   Not on file  Social History Narrative   Not on file   Social Determinants of Health   Financial Resource Strain: Not on file  Food Insecurity: Not on file  Transportation Needs: Not on file  Physical Activity: Not on file  Stress: Not on file  Social Connections: Not on file  Intimate Partner Violence: Not on  file   Social History   Tobacco Use  Smoking Status Former   Packs/day: 0.25   Types: Cigarettes   Quit date: 01/09/2020   Years since quitting: 2.3  Smokeless Tobacco Never   Social History   Substance and Sexual Activity  Alcohol Use Yes   Comment: social    Family History:  Family History  Problem Relation Age of Onset   Hypertension Maternal Grandmother    Cancer Paternal Grandfather        throat   Multiple sclerosis Paternal Grandfather    Anxiety disorder Mother     Past medical history, surgical history, medications, allergies, family history and social history reviewed with patient today and changes made to appropriate areas of the chart.   Review of Systems - negative All other ROS negative except what is listed above and in the HPI.      Objective:    BP 128/80   Pulse 92   Temp 98.3 F (36.8 C) (Oral)   Ht 5' 4.49" (1.638 m)   Wt 148 lb 8 oz (67.4 kg)   SpO2 99%   BMI 25.11 kg/m   Wt Readings from Last 3 Encounters:  05/02/22 148 lb 8 oz (67.4 kg)  11/23/21 157 lb 6.4 oz (71.4 kg)  10/27/21 155 lb 6.4 oz (70.5 kg)    Physical Exam Vitals and nursing note reviewed. Exam conducted with a chaperone present.  Constitutional:      General: She is awake. She is not in acute distress.    Appearance: She is well-developed and well-groomed. She is not ill-appearing or toxic-appearing.  HENT:     Head: Normocephalic.     Right Ear: Hearing, tympanic membrane, ear canal and external ear normal.     Left Ear: Hearing, tympanic membrane, ear canal  and external ear normal.     Nose: Nose normal.     Mouth/Throat:     Mouth: Mucous membranes are moist.     Pharynx: Oropharynx is clear. Uvula midline.  Eyes:     General: Lids are normal.        Right eye: No discharge.        Left eye: No discharge.     Conjunctiva/sclera: Conjunctivae normal.     Pupils: Pupils are equal, round, and reactive to light.  Neck:     Thyroid: No thyromegaly.     Vascular: No carotid bruit.  Cardiovascular:     Rate and Rhythm: Normal rate and regular rhythm.     Heart sounds: Normal heart sounds. No murmur heard.    No gallop.  Pulmonary:     Effort: Pulmonary effort is normal. No accessory muscle usage or respiratory distress.     Breath sounds: Normal breath sounds.  Chest:  Breasts:    Right: Normal.     Left: Normal.     Comments: No masses noted.  Mild discomfort on palpation 11:30 right breast.  Dense tissue palpated. Abdominal:     General: Bowel sounds are normal.     Palpations: Abdomen is soft.     Tenderness: There is no abdominal tenderness.  Musculoskeletal:     Cervical back: Normal range of motion and neck supple.     Right lower leg: No edema.     Left lower leg: No edema.  Lymphadenopathy:     Cervical: No cervical adenopathy.     Upper Body:     Right upper body: No supraclavicular, axillary or pectoral adenopathy.  Left upper body: No supraclavicular, axillary or pectoral adenopathy.  Skin:    General: Skin is warm and dry.  Neurological:     Mental Status: She is alert and oriented to person, place, and time.     Deep Tendon Reflexes:     Reflex Scores:      Brachioradialis reflexes are 2+ on the right side and 2+ on the left side.      Patellar reflexes are 2+ on the right side and 2+ on the left side. Psychiatric:        Attention and Perception: Attention normal.        Mood and Affect: Mood normal.        Speech: Speech normal.        Behavior: Behavior normal. Behavior is cooperative.        Thought  Content: Thought content normal.        Cognition and Memory: Cognition normal.        Judgment: Judgment normal.    Results for orders placed or performed in visit on 11/23/21  Microscopic Examination  Result Value Ref Range   WBC, UA None seen 0 - 5 /hpf   RBC, Urine 0-2 0 - 2 /hpf   Epithelial Cells (non renal) 0-10 0 - 10 /hpf   Bacteria, UA None seen None seen/Few  WET PREP FOR TRICH, YEAST, CLUE   Urine  Result Value Ref Range   Trichomonas Exam Negative Negative   Yeast Exam Negative Negative   Clue Cell Exam Negative Negative  Urinalysis, Routine w reflex microscopic  Result Value Ref Range   Specific Gravity, UA 1.010 1.005 - 1.030   pH, UA 7.0 5.0 - 7.5   Color, UA Yellow Yellow   Appearance Ur Clear Clear   Leukocytes,UA Negative Negative   Protein,UA Negative Negative/Trace   Glucose, UA Negative Negative   Ketones, UA Negative Negative   RBC, UA Trace (A) Negative   Bilirubin, UA Negative Negative   Urobilinogen, Ur 0.2 0.2 - 1.0 mg/dL   Nitrite, UA Negative Negative   Microscopic Examination See below:       Assessment & Plan:   Problem List Items Addressed This Visit       Cardiovascular and Mediastinum   Migraine without aura and without status migrainosus, not intractable    Chronic, ongoing.  No red flag symptoms today.  Followed by neurology, continue current medication regimen as prescribed by them.  Had benefit from Nurtec, but this is not covered by insurance.      Relevant Medications   sertraline (ZOLOFT) 50 MG tablet   Other Relevant Orders   CBC with Differential/Platelet   Comprehensive metabolic panel   TSH     Nervous and Auditory   Multiple sclerosis ([place]) - Primary    Chronic, ongoing.  Continue to collaborate with neurology team, recent notes reviewed.  She is concerned for lupus based on family history and her recent mouth ulcers frequently, increased fatigue, and rashes to face intermittent.  Will check ANA, prote electro, CRP,  ESR, Antiphospholipid, and C3/C4 today + B12 (has not been taking her B12).  If any abnormal consider rheumatology referral.      Relevant Orders   CBC with Differential/Platelet   Comprehensive metabolic panel   Serum protein electrophoresis with reflex   C-reactive protein   Sed Rate (ESR)   C3 and C4   Antiphospholipid syndrome eval, bld   ANA 12 Plus Profile (RDL)  Other   Anxiety    Chronic, stable.  Denies SI/HI.  Continue Zoloft as prescribed by PCP and Xanax as prescribed by neurology.  Return in 6 months.      Relevant Medications   sertraline (ZOLOFT) 50 MG tablet   Depression    Chronic, stable.  Denies SI/HI.  Continue Zoloft prescribed by PCP and Xanax as prescribed by neurology.  Return to office in 6 months, sooner if worsening mood.      Relevant Medications   sertraline (ZOLOFT) 50 MG tablet   Heart burn    Chronic, stable.  Continue Omeprazole & Linzess and collaboration with GI provider on as needed basis.  Mag level today.  Risks of PPI use were discussed with patient including bone loss, C. Diff diarrhea, pneumonia, infections, CKD, electrolyte abnormalities.Verbalizes understanding and chooses to continue the medication.       Relevant Orders   Magnesium   IUD (intrauterine device) in place    Continue collaboration with GYN.      Nicotine dependence, cigarettes, uncomplicated    I have recommended complete cessation of tobacco use. I have discussed various options available for assistance with tobacco cessation including over the counter methods (Nicotine gum, patch and lozenges). We also discussed prescription options (Chantix, Nicotine Inhaler / Nasal Spray). The patient is not interested in pursuing any prescription tobacco cessation options at this time.       Vitamin B12 deficiency    Chronic, ongoing.  Is not taking supplement, recommend she restart this. Vitamin B12 level today.      Relevant Orders   Vitamin B12   Vitamin D deficiency     Chronic, ongoing.  Is not taking supplement, recommend she restart this. Vitamin D level today.      Relevant Orders   VITAMIN D 25 Hydroxy (Vit-D Deficiency, Fractures)   Other Visit Diagnoses     Breast pain, right       No masses noted on exam, will obtain imaging and determine next steps after results return.   Relevant Orders   US BREAST COMPLETE UNI RIGHT INC AXILLA   Encounter for lipid screening for cardiovascular disease       Lipid panel on labs today.   Relevant Orders   Comprehensive metabolic panel   Lipid Panel w/o Chol/HDL Ratio   Need for influenza vaccination       Flu shot in office today.   Relevant Orders   Flu Vaccine QUAD 52moIM (Fluarix, Fluzone & Alfiuria Quad PF) (Completed)   Screening for chlamydial disease       Chlamydia screening due and obtained on labs.   Relevant Orders   GC/Chlamydia Probe Amp   Screening for HIV (human immunodeficiency virus)       HIV screening per guidelines on labs.   Relevant Orders   HIV antibody (with reflex)   Encounter for annual physical exam       Annual physical today with labs and health maintenance reviewed, discussed with patient.        Follow up plan: Return in about 6 months (around 11/01/2022) for MOOD AND MS.   LABORATORY TESTING:  - Pap smear: pap done on 01/04/22 with Dr. BLeafy Ro-- had IUD placed  IMMUNIZATIONS:   - Tdap: Tetanus vaccination status reviewed: last tetanus booster within 10 years. - Influenza: Up To Date - Pneumovax: Not applicable - Prevnar: Not applicable - HPV: Not applicable - Zostavax vaccine: Not applicable - Covid == x 2  SCREENING: -Mammogram: Not applicable  - Colonoscopy: Not applicable  - Bone Density: Not applicable  -Hearing Test: Not applicable  -Spirometry: Not applicable   PATIENT COUNSELING:   Advised to take 1 mg of folate supplement per day if capable of pregnancy.   Sexuality: Discussed sexually transmitted diseases, partner selection, use of  condoms, avoidance of unintended pregnancy  and contraceptive alternatives.   Advised to avoid cigarette smoking.  I discussed with the patient that most people either abstain from alcohol or drink within safe limits (<=14/week and <=4 drinks/occasion for males, <=7/weeks and <= 3 drinks/occasion for females) and that the risk for alcohol disorders and other health effects rises proportionally with the number of drinks per week and how often a drinker exceeds daily limits.  Discussed cessation/primary prevention of drug use and availability of treatment for abuse.   Diet: Encouraged to adjust caloric intake to maintain  or achieve ideal body weight, to reduce intake of dietary saturated fat and total fat, to limit sodium intake by avoiding high sodium foods and not adding table salt, and to maintain adequate dietary potassium and calcium preferably from fresh fruits, vegetables, and low-fat dairy products.    Stressed the importance of regular exercise  Injury prevention: Discussed safety belts, safety helmets, smoke detector, smoking near bedding or upholstery.   Dental health: Discussed importance of regular tooth brushing, flossing, and dental visits.    NEXT PREVENTATIVE PHYSICAL DUE IN 1 YEAR. Return in about 6 months (around 11/01/2022) for MOOD AND MS.

## 2022-05-02 NOTE — Assessment & Plan Note (Signed)
I have recommended complete cessation of tobacco use. I have discussed various options available for assistance with tobacco cessation including over the counter methods (Nicotine gum, patch and lozenges). We also discussed prescription options (Chantix, Nicotine Inhaler / Nasal Spray). The patient is not interested in pursuing any prescription tobacco cessation options at this time.  

## 2022-05-02 NOTE — Assessment & Plan Note (Signed)
Chronic, stable.  Denies SI/HI.  Continue Zoloft as prescribed by PCP and Xanax as prescribed by neurology.  Return in 6 months.

## 2022-05-02 NOTE — Assessment & Plan Note (Signed)
Chronic, stable.  Denies SI/HI.  Continue Zoloft prescribed by PCP and Xanax as prescribed by neurology.  Return to office in 6 months, sooner if worsening mood.

## 2022-05-03 ENCOUNTER — Encounter: Payer: Self-pay | Admitting: Nurse Practitioner

## 2022-05-03 LAB — C3 AND C4
Complement C3, Serum: 102 mg/dL (ref 82–167)
Complement C4, Serum: 19 mg/dL (ref 12–38)

## 2022-05-03 MED ORDER — ONDANSETRON HCL 4 MG PO TABS: 4.0000 mg | ORAL_TABLET | Freq: Three times a day (TID) | ORAL | 0 refills | Status: DC | PRN

## 2022-05-03 NOTE — Progress Notes (Signed)
Contacted via MyChart   Good morning Philana, not all labs are back yet but current labs are stable with a few exceptions: - B12 very low again, restart your B12 at home for overall nervous system health and energy. You can take 1000 MCG by mouth daily. - Vitamin D is on low side, restart your Vitamin D supplement daily. - LDL (bad cholesterol) a little elevated) -- keep focus on diet and regular activity. Any questions? Keep being amazing!!  Thank you for allowing me to participate in your care.  I appreciate you. Kindest regards, Reiley Keisler

## 2022-05-04 NOTE — Progress Notes (Signed)
So far lupus testing is returning negative:)

## 2022-05-08 ENCOUNTER — Encounter: Payer: Self-pay | Admitting: Nurse Practitioner

## 2022-05-10 NOTE — Progress Notes (Signed)
ANA testing returned negative:)

## 2022-05-12 LAB — CBC WITH DIFFERENTIAL/PLATELET
Basophils Absolute: 0.1 10*3/uL (ref 0.0–0.2)
Basos: 1 %
EOS (ABSOLUTE): 0.2 10*3/uL (ref 0.0–0.4)
Eos: 3 %
Hematocrit: 41.3 % (ref 34.0–46.6)
Hemoglobin: 14.1 g/dL (ref 11.1–15.9)
Immature Grans (Abs): 0 10*3/uL (ref 0.0–0.1)
Immature Granulocytes: 0 %
Lymphocytes Absolute: 2.1 10*3/uL (ref 0.7–3.1)
Lymphs: 32 %
MCH: 30.8 pg (ref 26.6–33.0)
MCHC: 34.1 g/dL (ref 31.5–35.7)
MCV: 90 fL (ref 79–97)
Monocytes Absolute: 0.4 10*3/uL (ref 0.1–0.9)
Monocytes: 7 %
Neutrophils Absolute: 3.8 10*3/uL (ref 1.4–7.0)
Neutrophils: 57 %
Platelets: 325 10*3/uL (ref 150–450)
RBC: 4.58 x10E6/uL (ref 3.77–5.28)
RDW: 12.1 % (ref 11.7–15.4)
WBC: 6.5 10*3/uL (ref 3.4–10.8)

## 2022-05-12 LAB — MAGNESIUM: Magnesium: 1.9 mg/dL (ref 1.6–2.3)

## 2022-05-12 LAB — ANTI-RO/NEG ANA: Anti-Ro (SS-A) Ab (RDL): <20 Units (ref <20)

## 2022-05-12 LAB — PROTEIN ELECTROPHORESIS, SERUM, WITH REFLEX
A/G Ratio: 1.8 — ABNORMAL HIGH (ref 0.7–1.7)
Albumin ELP: 3.9 g/dL (ref 2.9–4.4)
Alpha 1: 0.2 g/dL (ref 0.0–0.4)
Alpha 2: 0.7 g/dL (ref 0.4–1.0)
Beta: 0.9 g/dL (ref 0.7–1.3)
Gamma Globulin: 0.3 g/dL — ABNORMAL LOW (ref 0.4–1.8)
Globulin, Total: 2.2 g/dL (ref 2.2–3.9)

## 2022-05-12 LAB — ANTIPHOSPHOLIPID SYNDROME EVAL, BLD
APTT PPP: 26.6 s (ref 22.9–30.2)
Anticardiolipin IgG: <9 GPL U/mL (ref 0–14)
Anticardiolipin IgM: <9 MPL U/mL (ref 0–12)
Beta-2 Glyco 1 IgM: <9 GPI IgM units (ref 0–32)
Beta-2 Glyco I IgG: <9 GPI IgG units (ref 0–20)
Dilute Viper Venom Time: 26.6 s (ref 0.0–47.0)
Hexagonal Phase Phospholipid: 4 s (ref 0–11)
INR: 1 (ref 0.9–1.2)
PT: 11 s (ref 9.1–12.0)
Thrombin Time: 21 s (ref 0.0–23.0)

## 2022-05-12 LAB — COMPREHENSIVE METABOLIC PANEL
ALT: 10 IU/L (ref 0–32)
AST: 15 IU/L (ref 0–40)
Albumin/Globulin Ratio: 3.1 — ABNORMAL HIGH (ref 1.2–2.2)
Albumin: 4.6 g/dL (ref 4.0–5.0)
Alkaline Phosphatase: 52 IU/L (ref 44–121)
BUN/Creatinine Ratio: 13 (ref 9–23)
BUN: 10 mg/dL (ref 6–20)
Bilirubin Total: 0.6 mg/dL (ref 0.0–1.2)
CO2: 22 mmol/L (ref 20–29)
Calcium: 9.3 mg/dL (ref 8.7–10.2)
Chloride: 100 mmol/L (ref 96–106)
Creatinine, Ser: 0.78 mg/dL (ref 0.57–1.00)
Globulin, Total: 1.5 g/dL (ref 1.5–4.5)
Glucose: 83 mg/dL (ref 70–99)
Potassium: 3.8 mmol/L (ref 3.5–5.2)
Sodium: 138 mmol/L (ref 134–144)
Total Protein: 6.1 g/dL (ref 6.0–8.5)
eGFR: 109 mL/min/{1.73_m2} (ref >59)

## 2022-05-12 LAB — SEDIMENTATION RATE: Sed Rate: 2 mm/hr (ref 0–32)

## 2022-05-12 LAB — LIPID PANEL W/O CHOL/HDL RATIO
Cholesterol, Total: 176 mg/dL (ref 100–199)
HDL: 44 mg/dL (ref >39)
LDL Chol Calc (NIH): 117 mg/dL — ABNORMAL HIGH (ref 0–99)
Triglycerides: 82 mg/dL (ref 0–149)
VLDL Cholesterol Cal: 15 mg/dL (ref 5–40)

## 2022-05-12 LAB — VITAMIN D 25 HYDROXY (VIT D DEFICIENCY, FRACTURES): Vit D, 25-Hydroxy: 24.8 ng/mL — ABNORMAL LOW (ref 30.0–100.0)

## 2022-05-12 LAB — VITAMIN B12: Vitamin B-12: 217 pg/mL — ABNORMAL LOW (ref 232–1245)

## 2022-05-12 LAB — ANA 12 PLUS PROFILE (RDL): Anti-Nuclear Ab by IFA (RDL): NEGATIVE

## 2022-05-12 LAB — C-REACTIVE PROTEIN: CRP: 1 mg/L (ref 0–10)

## 2022-05-12 LAB — TSH: TSH: 1.54 u[IU]/mL (ref 0.450–4.500)

## 2022-05-12 LAB — HIV ANTIBODY (ROUTINE TESTING W REFLEX): HIV Screen 4th Generation wRfx: NONREACTIVE

## 2022-05-12 LAB — COAG STUDIES INTERP REPORT

## 2022-05-29 ENCOUNTER — Encounter: Payer: Self-pay | Admitting: Nurse Practitioner

## 2022-05-30 ENCOUNTER — Telehealth: Payer: Medicaid Other | Admitting: Family Medicine

## 2022-05-30 ENCOUNTER — Encounter: Payer: Self-pay | Admitting: Family Medicine

## 2022-05-30 ENCOUNTER — Encounter: Payer: Self-pay | Admitting: Nurse Practitioner

## 2022-05-30 ENCOUNTER — Ambulatory Visit: Payer: Self-pay

## 2022-05-30 ENCOUNTER — Telehealth (INDEPENDENT_AMBULATORY_CARE_PROVIDER_SITE_OTHER): Payer: Medicaid Other | Admitting: Family Medicine

## 2022-05-30 DIAGNOSIS — U071 COVID-19: Secondary | ICD-10-CM

## 2022-05-30 MED ORDER — NIRMATRELVIR/RITONAVIR (PAXLOVID)TABLET: 3.0000 | ORAL_TABLET | Freq: Two times a day (BID) | ORAL | 0 refills | Status: AC

## 2022-05-30 NOTE — Telephone Encounter (Signed)
Summary: flu like symptoms   Pt called with fever, congestion, headache nausea, vomiting, chills,  body aches.  Can't sleep. Has been taking tylenol.    CB#  [id number]

## 2022-05-30 NOTE — Progress Notes (Signed)
Virtual Visit via Video Note  I connected with Sparkman on 05/30/22 at  2:40 PM EST by a video enabled telemedicine application and verified that I am speaking with the correct person using two identifiers.  Location: Patient: home Provider: home   I discussed the limitations of evaluation and management by telemedicine and the availability of in person appointments. The patient expressed understanding and agreed to proceed.  History of Present Illness:  VIRAL INFECTION - symptom onset 3 days ago - COVID positive today - diarrhea, nausea, vomiting now resolved. Last vomit 7am this morning. Had a little soup, few bites, water.   - fever, insomnia - body aches  Fever: low grade now Cough: no Shortness of breath: no Chest pain: yes with vomiting Chest tightness: yes Sore throat: yes Headache: yes Vomiting: yes Fatigue: yes Sick contacts: yes Context: worse Treatments attempted: tylenol, imitrex, pepto bismol     Observations/Objective:  Mildly ill appearing, in NAD. Speaks in full sentences. Comfortable WOB on RA. No resp distress.    Assessment and Plan:  COVID-19 Moderate sx. Is a candidate for COVID treatment, paxlovid sent to pharmacy. Reviewed OTC symptom relief, self-quarantine guidelines, and emergency precautions.      I discussed the assessment and treatment plan with the patient. The patient was provided an opportunity to ask questions and all were answered. The patient agreed with the plan and demonstrated an understanding of the instructions.   The patient was advised to call back or seek an in-person evaluation if the symptoms worsen or if the condition fails to improve as anticipated.  I provided 16 minutes of non-face-to-face time during this encounter.   Myles Gip, DO

## 2022-05-30 NOTE — Patient Instructions (Signed)
It was great to see you!  Our plans for today:  - See below for self-isolation guidelines. You may end your quarantine once you are 10 days from symptom onset and fever free for 24 hours without use of tylenol or ibuprofen. Wear a well-fitting N95 mask if you have to go out and about. - Take the paxlovid as directed. See HotterNames.de for more information about the medication. - Certainly, if you are having difficulties breathing or unable to keep down fluids, go to the Emergency Department.   Take care and seek immediate care sooner if you develop any concerns.   Dr. Ky Barban     Person Under Monitoring Name: Jennifer Watts  Location: [place] [address]   Infection Prevention Recommendations for Individuals Confirmed to have, or Being Evaluated for, 2019 Novel Coronavirus (COVID-19) Infection Who Receive Care at Home  Individuals who are confirmed to have, or are being evaluated for, COVID-19 should follow the prevention steps below until a healthcare provider or local or state health department says they can return to normal activities.  Stay home except to get medical care You should restrict activities outside your home, except for getting medical care. Do not go to work, school, or public areas, and do not use public transportation or taxis.  Call ahead before visiting your doctor Before your medical appointment, call the healthcare provider and tell them that you have, or are being evaluated for, COVID-19 infection. This will help the healthcare provider's office take steps to keep other people from getting infected. Ask your healthcare provider to call the local or state health department.  Monitor your symptoms Seek prompt medical attention if your illness is worsening (e.g., difficulty breathing). Before going to your medical appointment, call the healthcare provider and tell them that you have, or are being evaluated  for, COVID-19 infection. Ask your healthcare provider to call the local or state health department.  Wear a facemask You should wear a facemask that covers your nose and mouth when you are in the same room with other people and when you visit a healthcare provider. People who live with or visit you should also wear a facemask while they are in the same room with you.  Separate yourself from other people in your home As much as possible, you should stay in a different room from other people in your home. Also, you should use a separate bathroom, if available.  Avoid sharing household items You should not share dishes, drinking glasses, cups, eating utensils, towels, bedding, or other items with other people in your home. After using these items, you should wash them thoroughly with soap and water.  Cover your coughs and sneezes Cover your mouth and nose with a tissue when you cough or sneeze, or you can cough or sneeze into your sleeve. Throw used tissues in a lined trash can, and immediately wash your hands with soap and water for at least 20 seconds or use an alcohol-based hand rub.  Wash your [facility] your hands often and thoroughly with soap and water for at least 20 seconds. You can use an alcohol-based hand sanitizer if soap and water are not available and if your hands are not visibly dirty. Avoid touching your eyes, nose, and mouth with unwashed hands.   Prevention Steps for Caregivers and Household Members of Individuals Confirmed to have, or Being Evaluated for, COVID-19 Infection Being Cared for in the Home  If you live with, or provide care  at home for, a person confirmed to have, or being evaluated for, COVID-19 infection please follow these guidelines to prevent infection:  Follow healthcare provider's instructions Make sure that you understand and can help the patient follow any healthcare provider instructions for all care.  Provide for the patient's basic  needs You should help the patient with basic needs in the home and provide support for getting groceries, prescriptions, and other personal needs.  Monitor the patient's symptoms If they are getting sicker, call his or her medical provider and tell them that the patient has, or is being evaluated for, COVID-19 infection. This will help the healthcare provider's office take steps to keep other people from getting infected. Ask the healthcare provider to call the local or state health department.  Limit the number of people who have contact with the patient If possible, have only one caregiver for the patient. Other household members should stay in another home or place of residence. If this is not possible, they should stay in another room, or be separated from the patient as much as possible. Use a separate bathroom, if available. Restrict visitors who do not have an essential need to be in the home.  Keep older adults, very young children, and other sick people away from the patient Keep older adults, very young children, and those who have compromised immune systems or chronic health conditions away from the patient. This includes people with chronic heart, lung, or kidney conditions, diabetes, and cancer.  Ensure good ventilation Make sure that shared spaces in the home have good air flow, such as from an air conditioner or an opened window, weather permitting.  Wash your hands often Wash your hands often and thoroughly with soap and water for at least 20 seconds. You can use an alcohol based hand sanitizer if soap and water are not available and if your hands are not visibly dirty. Avoid touching your eyes, nose, and mouth with unwashed hands. Use disposable paper towels to dry your hands. If not available, use dedicated cloth towels and replace them when they become wet.  Wear a facemask and gloves Wear a disposable facemask at all times in the room and gloves when you touch or have  contact with the patient's blood, body fluids, and/or secretions or excretions, such as sweat, saliva, sputum, nasal mucus, vomit, urine, or feces.  Ensure the mask fits over your nose and mouth tightly, and do not touch it during use. Throw out disposable facemasks and gloves after using them. Do not reuse. Wash your hands immediately after removing your facemask and gloves. If your personal clothing becomes contaminated, carefully remove clothing and launder. Wash your hands after handling contaminated clothing. Place all used disposable facemasks, gloves, and other waste in a lined container before disposing them with other household waste. Remove gloves and wash your hands immediately after handling these items.  Do not share dishes, glasses, or other household items with the patient Avoid sharing household items. You should not share dishes, drinking glasses, cups, eating utensils, towels, bedding, or other items with a patient who is confirmed to have, or being evaluated for, COVID-19 infection. After the person uses these items, you should wash them thoroughly with soap and water.  Wash laundry thoroughly Immediately remove and wash clothes or bedding that have blood, body fluids, and/or secretions or excretions, such as sweat, saliva, sputum, nasal mucus, vomit, urine, or feces, on them. Wear gloves when handling laundry from the patient. Read and follow directions on  labels of laundry or clothing items and detergent. In general, wash and dry with the warmest temperatures recommended on the label.  Clean all areas the individual has used often Clean all touchable surfaces, such as counters, tabletops, doorknobs, bathroom fixtures, toilets, phones, keyboards, tablets, and bedside tables, every day. Also, clean any surfaces that may have blood, body fluids, and/or secretions or excretions on them. Wear gloves when cleaning surfaces the patient has come in contact with. Use a diluted bleach  solution (e.g., dilute bleach with 1 part bleach and 10 parts water) or a household disinfectant with a label that says EPA-registered for coronaviruses. To make a bleach solution at home, add 1 tablespoon of bleach to 1 quart (4 cups) of water. For a larger supply, add  cup of bleach to 1 gallon (16 cups) of water. Read labels of cleaning products and follow recommendations provided on product labels. Labels contain instructions for safe and effective use of the cleaning product including precautions you should take when applying the product, such as wearing gloves or eye protection and making sure you have good ventilation during use of the product. Remove gloves and wash hands immediately after cleaning.  Monitor yourself for signs and symptoms of illness Caregivers and household members are considered close contacts, should monitor their health, and will be asked to limit movement outside of the home to the extent possible. Follow the monitoring steps for close contacts listed on the symptom monitoring form.   ? If you have additional questions, contact your local health department or call the epidemiologist on call at [phone] (available 24/7). ? This guidance is subject to change. For the most up-to-date guidance from [facility], please refer to their website: [email or web address]

## 2022-05-30 NOTE — Telephone Encounter (Signed)
  Chief Complaint: Flu like s/s Symptoms: HA, Ba, chills, chills, vomiting diarrhea Frequency: 2 days Pertinent Negatives: Patient denies  Disposition: [] ED /[] Urgent Care (no appt availability in office) / [x] Appointment(In office/virtual)/ []  [facility]/ [] Home Care/ [] Refused Recommended Disposition /[] [facility]/ []  Follow-up with PCP Additional Notes: PT thinks she has the flu. Her boyfriend will go to pharmacy to obtain home COVID test prior to Virtual visit.  Summary: flu like symptoms     Pt called with fever, congestion, headache nausea, vomiting, chills,  body aches.  Can't sleep. Has been taking tylenol.    CB#  [phone]     Reason for Disposition  Patient is HIGH RISK (e.g., age > 42 years, pregnant, HIV+, or chronic medical condition)  Answer Assessment - Initial Assessment Questions 1. WORST SYMPTOM: "What is your worst symptom?" (e.g., cough, runny nose, muscle aches, headache, sore throat, fever)      HA,  2. ONSET: "When did your flu symptoms start?"      2 days ago 3. COUGH: "How bad is the cough?"       4. RESPIRATORY DISTRESS: "Describe your breathing."       5. FEVER: "Do you have a fever?" If Yes, ask: "What is your temperature, how was it measured, and when did it start?"      6. EXPOSURE: "Were you exposed to someone with influenza?"        7. FLU VACCINE: "Did you get a flu shot this year?"      8. HIGH RISK DISEASE: "Do you have any chronic medical problems?" (e.g., heart or lung disease, asthma, weak immune system, or other HIGH RISK conditions)     MS 9. PREGNANCY: "Is there any chance you are pregnant?" "When was your last menstrual period?"      10. OTHER SYMPTOMS: "Do you have any other symptoms?"  (e.g., runny nose, muscle aches, headache, sore throat)       Fever, congestion, BA, Chills. Pt had vomiting and diarrhea - has resolved.  Protocols used: Influenza (Flu) - [facility]

## 2022-06-02 ENCOUNTER — Encounter: Payer: Self-pay | Admitting: Nurse Practitioner

## 2022-06-02 MED ORDER — PROMETHAZINE HCL 12.5 MG PO TABS: 12.5000 mg | ORAL_TABLET | Freq: Three times a day (TID) | ORAL | 0 refills | Status: DC | PRN

## 2022-07-11 ENCOUNTER — Encounter: Payer: Self-pay | Admitting: Nurse Practitioner

## 2022-07-16 NOTE — Patient Instructions (Signed)
Managing Anxiety, Adult After being diagnosed with anxiety, you may be relieved to know why you have felt or behaved a certain way. You may also feel overwhelmed about the treatment ahead and what it will mean for your life. With care and support, you can manage your anxiety. How to manage lifestyle changes Understanding the difference between stress and anxiety Although stress can play a role in anxiety, it is not the same as anxiety. Stress is your body's reaction to life changes and events, both good and bad. Stress is often caused by something external, such as a deadline, test, or competition. It normally goes away after the event has ended and will last just a few hours. But, stress can be ongoing and can lead to more than just stress. Anxiety is caused by something internal, such as imagining a terrible outcome or worrying that something will go wrong that will greatly upset you. Anxiety often does not go away even after the event is over, and it can become a long-term (chronic) worry. Lowering stress and anxiety Talk with your health care provider or a counselor to learn more about lowering anxiety and stress. They may suggest tension-reduction techniques, such as: Music. Spend time creating or listening to music that you enjoy and that inspires you. Mindfulness-based meditation. Practice being aware of your normal breaths while not trying to control your breathing. It can be done while sitting or walking. Centering prayer. Focus on a word, phrase, or sacred image that means something to you and brings you peace. Deep breathing. Expand your stomach and inhale slowly through your nose. Hold your breath for 3-5 seconds. Then breathe out slowly, letting your stomach muscles relax. Self-talk. Learn to notice and spot thought patterns that lead to anxiety reactions. Change those patterns to thoughts that feel peaceful. Muscle relaxation. Take time to tense muscles and then relax them. Choose a  tension-reduction technique that fits your lifestyle and personality. These techniques take time and practice. Set aside 5-15 minutes a day to do them. Specialized therapists can offer counseling and training in these techniques. The training to help with anxiety may be covered by some insurance plans. Other things you can do to manage stress and anxiety include: Keeping a stress diary. This can help you learn what triggers your reaction and then learn ways to manage your response. Thinking about how you react to certain situations. You may not be able to control everything, but you can control your response. Making time for activities that help you relax and not feeling guilty about spending your time in this way. Doing visual imagery. This involves imagining or creating mental pictures to help you relax. Practicing yoga. Through yoga poses, you can lower tension and relax.  Medicines Medicines for anxiety include: Antidepressant medicines. These are usually prescribed for long-term daily control. Anti-anxiety medicines. These may be added in severe cases, especially when panic attacks occur. When used together, medicines, psychotherapy, and tension-reduction techniques may be the most effective treatment. Relationships Relationships can play a big part in helping you recover. Spend more time connecting with trusted friends and family members. Think about going to couples counseling if you have a partner, taking family education classes, or going to family therapy. Therapy can help you and others better understand your anxiety. How to recognize changes in your anxiety Everyone responds differently to treatment for anxiety. Recovery from anxiety happens when symptoms lessen and stop interfering with your daily life at home or work. This may mean that you  will start to: Have better concentration and focus. Worry will interfere less in your daily thinking. Sleep better. Be less irritable. Have more  energy. Have improved memory. Try to recognize when your condition is getting worse. Contact your provider if your symptoms interfere with home or work and you feel like your condition is not improving. Follow these instructions at home: Activity Exercise. Adults should: Exercise for at least 150 minutes each week. The exercise should increase your heart rate and make you sweat (moderate-intensity exercise). Do strengthening exercises at least twice a week. Get the right amount and quality of sleep. Most adults need 7-9 hours of sleep each night. Lifestyle  Eat a healthy diet that includes plenty of vegetables, fruits, whole grains, low-fat dairy products, and lean protein. Do not eat a lot of foods that are high in fats, added sugars, or salt (sodium). Make choices that simplify your life. Do not use any products that contain nicotine or tobacco. These products include cigarettes, chewing tobacco, and vaping devices, such as e-cigarettes. If you need help quitting, ask your provider. Avoid caffeine, alcohol, and certain over-the-counter cold medicines. These may make you feel worse. Ask your pharmacist which medicines to avoid. General instructions Take over-the-counter and prescription medicines only as told by your provider. Keep all follow-up visits. This is to make sure you are managing your anxiety well or if you need more support. Where to find support You can get help and support from: Self-help groups. Online and [employer]. A trusted spiritual leader. Couples counseling. Family education classes. Family therapy. Where to find more information You may find that joining a support group helps you deal with your anxiety. The following sources can help you find counselors or support groups near you: [place]: mentalhealth[place].net Anxiety and Depression Association of [place] (ADAA): adaa.[place] on Mental Illness (NAMI): nami.org Contact  a health care provider if: You have a hard time staying focused or finishing tasks. You spend many hours a day feeling worried about everyday life. You are very tired because you cannot stop worrying. You start to have headaches or often feel tense. You have chronic nausea or diarrhea. Get help right away if: Your heart feels like it is racing. You have shortness of breath. You have thoughts of hurting yourself or others. Get help right away if you feel like you may hurt yourself or others, or have thoughts about taking your own life. Go to your nearest emergency room or: Call 911. Call the [place] at [phone] or 988. This is open 24 hours a day. Text the Crisis Text Line at 3060824417. This information is not intended to replace advice given to you by your health care provider. Make sure you discuss any questions you have with your health care provider. Document Revised: 02/07/2022 Document Reviewed: 08/22/2020 Elsevier Patient Education  [place].

## 2022-07-19 ENCOUNTER — Telehealth (INDEPENDENT_AMBULATORY_CARE_PROVIDER_SITE_OTHER): Payer: Medicaid Other | Admitting: Nurse Practitioner

## 2022-07-19 ENCOUNTER — Encounter: Payer: Self-pay | Admitting: Nurse Practitioner

## 2022-07-19 DIAGNOSIS — F330 Major depressive disorder, recurrent, mild: Secondary | ICD-10-CM | POA: Diagnosis not present

## 2022-07-19 DIAGNOSIS — F419 Anxiety disorder, unspecified: Secondary | ICD-10-CM

## 2022-07-19 DIAGNOSIS — F5104 Psychophysiologic insomnia: Secondary | ICD-10-CM | POA: Diagnosis not present

## 2022-07-19 MED ORDER — SERTRALINE HCL 50 MG PO TABS: 75.0000 mg | ORAL_TABLET | Freq: Every day | ORAL | 4 refills | Status: DC

## 2022-07-19 NOTE — Progress Notes (Signed)
Called pt lmom for the pt to call the office to get scheduled for DEPRESSION AND INSOMNIA in 6 weeks follow-up with Jolene.

## 2022-07-19 NOTE — Assessment & Plan Note (Signed)
Chronic, exacerbated.  Denies SI/HI.  Increase Zoloft to 75 MG at this time due to exacerbation with terminally ill uncle, we discussed she can return to 50 MG dosing in future if mood stabilizes.  Return to office in 6 weeks.

## 2022-07-19 NOTE — Assessment & Plan Note (Signed)
Chronic, stable.  Denies SI/HI.  Continue Zoloft as prescribed by PCP and Xanax as prescribed by neurology.

## 2022-07-19 NOTE — Progress Notes (Signed)
There were no vitals taken for this visit.   Subjective:    Patient ID: Jennifer Watts, female    DOB: 08/23/1995, 27 y.o.   MRN: [id number]  HPI: Jennifer Watts is a 27 y.o. female  Chief Complaint  Patient presents with   Depression   Insomnia   This visit was completed via video visit through MyChart due to the restrictions of the COVID-19 pandemic. All issues as above were discussed and addressed. Physical exam was done as above through visual confirmation on video through MyChart. If it was felt that the patient should be evaluated in the office, they were directed there. The patient verbally consented to this visit. Location of the patient: home Location of the provider: work Those involved with this call:  Provider: Marnee Guarneri, DNP CMA: Frazier Butt, [place] Desk/Registration: [employer]  Time spent on call:  21 minutes with patient face to face via video conference. More than 50% of this time was spent in counseling and coordination of care. 15 minutes total spent in review of patient's record and preparation of their chart.  I verified patient identity using two factors (patient name and date of birth). Patient consents verbally to being seen via telemedicine visit today.    DEPRESSION Current medications include Zoloft 50 MG daily prescribed by PCP and Xanax 0.5 MG PRN at bedtime prescribed by neurology.  Tried taking Benadryl and Melatonin, these worked for a little bit and then stopped working.    Her uncle was diagnosed with terminal cancer last week, so this has exacerbated mood somewhat and sleep.   Mood status: exacerbated Satisfied with current treatment?: yes Symptom severity: moderate  Duration of current treatment : chronic Side effects: no Medication compliance: good compliance Psychotherapy/counseling: yes in past Previous psychiatric medications: Celexa (worked initially and then stopped), Nortriptyline (did not help), Trazodone (had been on  100 MG and 150 MG -- woke up with migraines with this) Depressed mood: yes Anxious mood: yes Anhedonia: yes Significant weight loss or gain: no Insomnia: yes hard to fall asleep Fatigue: yes Feelings of worthlessness or guilt: no Impaired concentration/indecisiveness:  a little bit Suicidal ideations: no Hopelessness: no Crying spells: no    07/19/2022    2:10 PM 05/02/2022   11:11 AM 03/27/2022    4:17 PM 11/23/2021    4:19 PM 10/27/2021   11:37 AM  Depression screen PHQ 2/9  Decreased Interest '2 2 2 '$ 0 1  Down, Depressed, Hopeless 1 0 0 0 0  PHQ - 2 Score '3 2 2 '$ 0 1  Altered sleeping '3 2 3 2 2  '$ Tired, decreased energy '3 2 2 '$ 0 1  Change in appetite 0 0 0 0 0  Feeling bad or failure about yourself  1 0 0 0 0  Trouble concentrating 1 1 0 0 0  Moving slowly or fidgety/restless 0 0 0 0 0  Suicidal thoughts 0 0 0 0 0  PHQ-9 Score '11 7 7 2 4  '$ Difficult doing work/chores Not difficult at all Not difficult at all Somewhat difficult Not difficult at all        07/19/2022    2:11 PM 05/02/2022   11:11 AM 03/27/2022    4:18 PM 11/23/2021    4:20 PM  GAD 7 : Generalized Anxiety Score  Nervous, Anxious, on Edge '2 1 1 1  '$ Control/stop worrying 1 0 0 0  Worry too much - different things 1 0 1 0  Trouble relaxing 2  1 1 0  Restless 1 0 0 0  Easily annoyed or irritable 2 0 0 0  Afraid - awful might happen 0 0 0 0  Total GAD 7 Score '9 2 3 1  '$ Anxiety Difficulty Not difficult at all Not difficult at all Not difficult at all Not difficult at all   Relevant past medical, surgical, family and social history reviewed and updated as indicated. Interim medical history since our last visit reviewed. Allergies and medications reviewed and updated.  Review of Systems  Constitutional:  Negative for activity change, appetite change, diaphoresis, fatigue and fever.  Respiratory:  Negative for cough, chest tightness and shortness of breath.   Cardiovascular:  Negative for chest pain, palpitations and  leg swelling.  Neurological: Negative.   Psychiatric/Behavioral:  Positive for decreased concentration and sleep disturbance. Negative for self-injury and suicidal ideas. The patient is nervous/anxious.    Per HPI unless specifically indicated above     Objective:    There were no vitals taken for this visit.  Wt Readings from Last 3 Encounters:  05/02/22 148 lb 8 oz (67.4 kg)  11/23/21 157 lb 6.4 oz (71.4 kg)  10/27/21 155 lb 6.4 oz (70.5 kg)    Physical Exam Vitals and nursing note reviewed.  Constitutional:      General: She is awake. She is not in acute distress.    Appearance: She is well-developed. She is not ill-appearing.  HENT:     Head: Normocephalic.     Right Ear: Hearing normal.     Left Ear: Hearing normal.  Eyes:     General: Lids are normal.        Right eye: No discharge.        Left eye: No discharge.     Conjunctiva/sclera: Conjunctivae normal.  Pulmonary:     Effort: Pulmonary effort is normal. No accessory muscle usage or respiratory distress.  Musculoskeletal:     Cervical back: Normal range of motion.  Neurological:     Mental Status: She is alert and oriented to person, place, and time.  Psychiatric:        Attention and Perception: Attention normal.        Mood and Affect: Mood normal.        Behavior: Behavior normal. Behavior is cooperative.        Thought Content: Thought content normal.        Judgment: Judgment normal.    Results for orders placed or performed in visit on 05/02/22  CBC with Differential/Platelet  Result Value Ref Range   WBC 6.5 3.4 - 10.8 x10E3/uL   RBC 4.58 3.77 - 5.28 x10E6/uL   Hemoglobin 14.1 11.1 - 15.9 g/dL   Hematocrit 41.3 34.0 - 46.6 %   MCV 90 79 - 97 fL   MCH 30.8 26.6 - 33.0 pg   MCHC 34.1 31.5 - 35.7 g/dL   RDW 12.1 11.7 - 15.4 %   Platelets 325 150 - 450 x10E3/uL   Neutrophils 57 Not Estab. %   Lymphs 32 Not Estab. %   Monocytes 7 Not Estab. %   Eos 3 Not Estab. %   Basos 1 Not Estab. %    Neutrophils Absolute 3.8 1.4 - 7.0 x10E3/uL   Lymphocytes Absolute 2.1 0.7 - 3.1 x10E3/uL   Monocytes Absolute 0.4 0.1 - 0.9 x10E3/uL   EOS (ABSOLUTE) 0.2 0.0 - 0.4 x10E3/uL   Basophils Absolute 0.1 0.0 - 0.2 x10E3/uL   Immature Granulocytes 0 Not Estab. %  Immature Grans (Abs) 0.0 0.0 - 0.1 x10E3/uL  Comprehensive metabolic panel  Result Value Ref Range   Glucose 83 70 - 99 mg/dL   BUN 10 6 - 20 mg/dL   Creatinine, Ser 0.78 0.57 - 1.00 mg/dL   eGFR 109 >59 mL/min/1.73   BUN/Creatinine Ratio 13 9 - 23   Sodium 138 134 - 144 mmol/L   Potassium 3.8 3.5 - 5.2 mmol/L   Chloride 100 96 - 106 mmol/L   CO2 22 20 - 29 mmol/L   Calcium 9.3 8.7 - 10.2 mg/dL   Total Protein 6.1 6.0 - 8.5 g/dL   Albumin 4.6 4.0 - 5.0 g/dL   Globulin, Total 1.5 1.5 - 4.5 g/dL   Albumin/Globulin Ratio 3.1 (H) 1.2 - 2.2   Bilirubin Total 0.6 0.0 - 1.2 mg/dL   Alkaline Phosphatase 52 44 - 121 IU/L   AST 15 0 - 40 IU/L   ALT 10 0 - 32 IU/L  Lipid Panel w/o Chol/HDL Ratio  Result Value Ref Range   Cholesterol, Total 176 100 - 199 mg/dL   Triglycerides 82 0 - 149 mg/dL   HDL 44 >39 mg/dL   VLDL Cholesterol Cal 15 5 - 40 mg/dL   LDL Chol Calc (NIH) 117 (H) 0 - 99 mg/dL  TSH  Result Value Ref Range   TSH 1.540 0.450 - 4.500 uIU/mL  VITAMIN D 25 Hydroxy (Vit-D Deficiency, Fractures)  Result Value Ref Range   Vit D, 25-Hydroxy 24.8 (L) 30.0 - 100.0 ng/mL  Vitamin B12  Result Value Ref Range   Vitamin B-12 217 (L) 232 - 1,245 pg/mL  Magnesium  Result Value Ref Range   Magnesium 1.9 1.6 - 2.3 mg/dL  HIV antibody (with reflex)  Result Value Ref Range   HIV Screen 4th Generation wRfx Non Reactive Non Reactive  Serum protein electrophoresis with reflex  Result Value Ref Range   Albumin ELP 3.9 2.9 - 4.4 g/dL   Alpha 1 0.2 0.0 - 0.4 g/dL   Alpha 2 0.7 0.4 - 1.0 g/dL   Beta 0.9 0.7 - 1.3 g/dL   Gamma Globulin 0.3 (L) 0.4 - 1.8 g/dL   M-Spike, % Not Observed Not Observed g/dL   Globulin, Total 2.2 2.2 -  3.9 g/dL   A/G Ratio 1.8 (H) 0.7 - 1.7   Please Note: Comment    Interpretation(See Below) Comment   C-reactive protein  Result Value Ref Range   CRP 1 0 - 10 mg/L  Sed Rate (ESR)  Result Value Ref Range   Sed Rate 2 0 - 32 mm/hr  C3 and C4  Result Value Ref Range   Complement C3, Serum 102 82 - 167 mg/dL   Complement C4, Serum 19 12 - 38 mg/dL  Antiphospholipid syndrome eval, bld  Result Value Ref Range   APTT PPP 26.6 22.9 - 30.2 sec   PT 11.0 9.1 - 12.0 sec   INR 1.0 0.9 - 1.2   Thrombin Time 21.0 0.0 - 23.0 sec   Dilute Viper Venom Time 26.6 0.0 - 47.0 sec   Hexagonal Phase Phospholipid 4 0 - 11 sec   Anticardiolipin IgG <9 0 - 14 GPL U/mL   Anticardiolipin IgM <9 0 - 12 MPL U/mL   Beta-2 Glyco I IgG <9 0 - 20 GPI IgG units   Beta-2 Glyco 1 IgM <9 0 - 32 GPI IgM units   APS Panel Interpretation Comment   ANA 12 Plus Profile (RDL)  Result Value Ref  Range   Anti-Nuclear Ab by IFA (RDL) Negative Negative  Coag Studies Interp Report  Result Value Ref Range   Interpretation Note   Anti-Ro/Neg ANA  Result Value Ref Range   Anti-Ro (SS-A) Ab (RDL) <20 <20 Units      Assessment & Plan:   Problem List Items Addressed This Visit       Other   Anxiety    Chronic, stable.  Denies SI/HI.  Continue Zoloft as prescribed by PCP and Xanax as prescribed by neurology.       Relevant Medications   sertraline (ZOLOFT) 50 MG tablet   Depression - Primary    Chronic, exacerbated.  Denies SI/HI.  Increase Zoloft to 75 MG at this time due to exacerbation with terminally ill uncle, we discussed she can return to 50 MG dosing in future if mood stabilizes.  Return to office in 6 weeks.      Relevant Medications   sertraline (ZOLOFT) 50 MG tablet   Psychophysiological insomnia    Chronic, ongoing issue.  Has been followed by neurology on this and utilized multiple medications.  Currently taking Xanax as needed.  Recommend therapy, CBT, to work on sleep issues and mental health.    Discussed trial of Olly Brand Sleep gummies at night to help with sleep, as she would prefer a more natural approach vs adding on more medications.  Continue to collaborate with neurology.       I discussed the assessment and treatment plan with the patient. The patient was provided an opportunity to ask questions and all were answered. The patient agreed with the plan and demonstrated an understanding of the instructions.   The patient was advised to call back or seek an in-person evaluation if the symptoms worsen or if the condition fails to improve as anticipated.   I provided 21+ minutes of time during this encounter.    Follow up plan: Return in about 6 weeks (around 08/30/2022) for DEPRESSION AND INSOMNIA.

## 2022-07-19 NOTE — Assessment & Plan Note (Signed)
Chronic, ongoing issue.  Has been followed by neurology on this and utilized multiple medications.  Currently taking Xanax as needed.  Recommend therapy, CBT, to work on sleep issues and mental health.   Discussed trial of Olly Brand Sleep gummies at night to help with sleep, as she would prefer a more natural approach vs adding on more medications.  Continue to collaborate with neurology.

## 2022-07-20 NOTE — Progress Notes (Signed)
Called the pt and lmom for the pt to call the office to get scheduled

## 2022-07-25 NOTE — Progress Notes (Signed)
Called the pt lmom for the pt to call the office to get scheduled.

## 2022-07-26 NOTE — Progress Notes (Signed)
LVM to call office to get scheduled for 6 week follow up appointment.

## 2022-08-26 NOTE — Patient Instructions (Signed)
Managing Depression, Adult Depression is a mental health condition that affects your thoughts, feelings, and actions. Being diagnosed with depression can bring you relief if you did not know why you have felt or behaved a certain way. It could also leave you feeling overwhelmed. Finding ways to manage your symptoms can help you feel more positive about your future. How to manage lifestyle changes Being depressed is difficult. Depression can increase the level of everyday stress. Stress can make depression symptoms worse. You may believe your symptoms cannot be managed or will never improve. However, there are many things you can try to help manage your symptoms. There is hope. Managing stress  Stress is your body's reaction to life changes and events, both good and bad. Stress can add to your feelings of depression. Learning to manage your stress can help lessen your feelings of depression. Try some of the following approaches to reducing your stress (stress reduction techniques): Listen to music that you enjoy and that inspires you. Try using a meditation app or take a meditation class. Develop a practice that helps you connect with your spiritual self. Walk in nature, pray, or go to a place of worship. Practice deep breathing. To do this, inhale slowly through your nose. Pause at the top of your inhale for a few seconds and then exhale slowly, letting yourself relax. Repeat this three or four times. Practice yoga to help relax and work your muscles. Choose a stress reduction technique that works for you. These techniques take time and practice to develop. Set aside 5-15 minutes a day to do them. Therapists can offer training in these techniques. Do these things to help manage stress: Keep a journal. Know your limits. Set healthy boundaries for yourself and others, such as saying "no" when you think something is too much. Pay attention to how you react to certain situations. You may not be able to  control everything, but you can change your reaction. Add humor to your life by watching funny movies or shows. Make time for activities that you enjoy and that relax you. Spend less time using electronics, especially at night before bed. The light from screens can make your brain think it is time to get up rather than go to bed.  Medicines Medicines, such as antidepressants, are often a part of treatment for depression. Talk with your pharmacist or health care provider about all the medicines, supplements, and herbal products that you take, their possible side effects, and what medicines and other products are safe to take together. Make sure to report any side effects you may have to your health care provider. Relationships Your health care provider may suggest family therapy, couples therapy, or individual therapy as part of your treatment. How to recognize changes Everyone responds differently to treatment for depression. As you recover from depression, you may start to: Have more interest in doing activities. Feel more hopeful. Have more energy. Eat a more regular amount of food. Have better mental focus. It is important to recognize if your depression is not getting better or is getting worse. The symptoms you had in the beginning may return, such as: Feeling tired. Eating too much or too little. Sleeping too much or too little. Feeling restless, agitated, or hopeless. Trouble focusing or making decisions. Having unexplained aches and pains. Feeling irritable, angry, or aggressive. If you or your family members notice these symptoms coming back, let your health care provider know right away. Follow these instructions at home: Activity Try to   get some form of exercise each day, such as walking. Try yoga, mindfulness, or other stress reduction techniques. Participate in group activities if you are able. Lifestyle Get enough sleep. Cut down on or stop using caffeine, tobacco,  alcohol, and any other harmful substances. Eat a healthy diet that includes plenty of vegetables, fruits, whole grains, low-fat dairy products, and lean protein. Limit foods that are high in solid fats, added sugar, or salt (sodium). General instructions Take over-the-counter and prescription medicines only as told by your health care provider. Keep all follow-up visits. It is important for your health care provider to check on your mood, behavior, and medicines. Your health care provider may need to make changes to your treatment. Where to find support Talking to others  Friends and family members can be sources of support and guidance. Talk to trusted friends or family members about your condition. Explain your symptoms and let them know that you are working with a health care provider to treat your depression. Tell friends and family how they can help. Finances Find mental health providers that fit with your financial situation. Talk with your health care provider if you are worried about access to food, housing, or medicine. Call your insurance company to learn about your co-pays and prescription plan. Where to find more information You can find support in your area from: Anxiety and Depression Association of America (ADAA): adaa.org Mental Health America: mentalhealthamerica.net National Alliance on Mental Illness: nami.org Contact a health care provider if: You stop taking your antidepressant medicines, and you have any of these symptoms: Nausea. Headache. Light-headedness. Chills and body aches. Not being able to sleep (insomnia). You or your friends and family think your depression is getting worse. Get help right away if: You have thoughts of hurting yourself or others. Get help right away if you feel like you may hurt yourself or others, or have thoughts about taking your own life. Go to your nearest emergency room or: Call 911. Call the National Suicide Prevention Lifeline at  1-800-273-8255 or 988. This is open 24 hours a day. Text the Crisis Text Line at 741741. This information is not intended to replace advice given to you by your health care provider. Make sure you discuss any questions you have with your health care provider. Document Revised: 09/06/2021 Document Reviewed: 09/06/2021 Elsevier Patient Education  2023 [employer].  

## 2022-08-30 ENCOUNTER — Telehealth (INDEPENDENT_AMBULATORY_CARE_PROVIDER_SITE_OTHER): Payer: Medicaid Other | Admitting: Nurse Practitioner

## 2022-08-30 ENCOUNTER — Encounter: Payer: Self-pay | Admitting: Nurse Practitioner

## 2022-08-30 DIAGNOSIS — F330 Major depressive disorder, recurrent, mild: Secondary | ICD-10-CM

## 2022-08-30 DIAGNOSIS — F419 Anxiety disorder, unspecified: Secondary | ICD-10-CM | POA: Diagnosis not present

## 2022-08-30 DIAGNOSIS — F5104 Psychophysiologic insomnia: Secondary | ICD-10-CM

## 2022-08-30 MED ORDER — SERTRALINE HCL 50 MG PO TABS: 50.0000 mg | ORAL_TABLET | Freq: Every day | ORAL | 4 refills | Status: DC

## 2022-08-30 NOTE — Assessment & Plan Note (Signed)
Chronic, stable.  Denies SI/HI.  Continue Zoloft as prescribed by PCP and Xanax as prescribed by neurology.  

## 2022-08-30 NOTE — Assessment & Plan Note (Signed)
Chronic, ongoing issue.  Has been followed by neurology on this and utilized multiple medications.  Currently taking Xanax as needed.  Recommend therapy, CBT, to work on sleep issues and mental health.   Discussed trial of Olly Brand Sleep gummies at night to help with sleep, as she would prefer a more natural approach vs adding on more medications.  Continue to collaborate with neurology. 

## 2022-08-30 NOTE — Assessment & Plan Note (Signed)
Chronic, stable at this time.  Denies SI/HI.  Continue Zoloft 50 MG daily, higher doses causes numbness + continue Xanax as ordered by neurology.  Scores have improved today.  Return to office in June as scheduled.  May benefit from therapy time in future.

## 2022-08-30 NOTE — Progress Notes (Signed)
There were no vitals taken for this visit.   Subjective:    Patient ID: Jennifer Watts, female    DOB: Aug 02, 1995, 28 y.o.   MRN: [id number]  HPI: Jennifer Watts is a 27 y.o. female  Chief Complaint  Patient presents with   Depression   This visit was completed via video visit through MyChart due to the restrictions of the COVID-19 pandemic. All issues as above were discussed and addressed. Physical exam was done as above through visual confirmation on video through MyChart. If it was felt that the patient should be evaluated in the office, they were directed there. The patient verbally consented to this visit. Location of the patient: home Location of the provider: work Those involved with this call:  Provider: Aura Dials, DNP CMA: Tristan Schroeder, CMA Front Desk/Registration: Ozella Almond  Time spent on call:  21 minutes with patient face to face via video conference. More than 50% of this time was spent in counseling and coordination of care. 15 minutes total spent in review of patient's record and preparation of their chart.  I verified patient identity using two factors (patient name and date of birth). Patient consents verbally to being seen via telemedicine visit today.    DEPRESSION Follow-up for mood today, we increased Zoloft to 75 MG last visit and recommended OTC supplements for insomnia.  She had bad stomach pains with 75 MG, reduced back to 50 MG and is tolerating better.  Current medications include Zoloft 50 MG daily prescribed by PCP and Xanax 0.5 MG PRN at bedtime prescribed by neurology.     Her uncle was diagnosed with terminal cancer and recently passed, this exacerbated mood somewhat and sleep.   Mood status: stable Satisfied with current treatment?: yes Symptom severity: moderate  Duration of current treatment : chronic Side effects: no Medication compliance: good compliance Psychotherapy/counseling: yes in the past Depressed mood:   occasional Anxious mood: occasional Anhedonia: occasional Significant weight loss or gain: no Insomnia: yes hard to fall asleep -- Trazodone causes migraines Fatigue: yes Feelings of worthlessness or guilt: no Impaired concentration/indecisiveness: no Suicidal ideations: no Hopelessness: no Crying spells: improving    08/30/2022    4:10 PM 07/19/2022    2:10 PM 05/02/2022   11:11 AM 03/27/2022    4:17 PM 11/23/2021    4:19 PM  Depression screen PHQ 2/9  Decreased Interest 0  Down, Depressed, Hopeless 0 1 0 0 0  PHQ - 2 Score 0  Altered sleeping Tired, decreased energy 0  Change in appetite 0 0 0 0 0  Feeling bad or failure about yourself  0 1 0 0 0  Trouble concentrating 0 0  Moving slowly or fidgety/restless 0 0 0 0 0  Suicidal thoughts 0 0 0 0 0  PHQ-9 Score Difficult doing work/chores Not difficult at all Not difficult at all Not difficult at all Somewhat difficult Not difficult at all       08/30/2022    4:12 PM 07/19/2022    2:11 PM 05/02/2022   11:11 AM 03/27/2022    4:18 PM  GAD 7 : Generalized Anxiety Score  Nervous, Anxious, on Edge 0 Control/stop worrying 0 1 0 0  Worry too much - different things 0 1 0 1  Trouble relaxing 1  Restless 1 1 0 0  Easily annoyed or irritable 0 2 0 0  Afraid - awful might happen 0 0 0 0  Total GAD 7 Score 2 9 2 3   Anxiety Difficulty Not difficult at all Not difficult at all Not difficult at all Not difficult at all   Relevant past medical, surgical, family and social history reviewed and updated as indicated. Interim medical history since our last visit reviewed. Allergies and medications reviewed and updated.  Review of Systems  Constitutional:  Negative for activity change, appetite change, diaphoresis, fatigue and fever.  Respiratory:  Negative for cough, chest tightness and shortness of breath.   Cardiovascular:  Negative for chest pain, palpitations and leg  swelling.  Neurological: Negative.   Psychiatric/Behavioral:  Positive for decreased concentration and sleep disturbance. Negative for self-injury and suicidal ideas. The patient is nervous/anxious.     Per HPI unless specifically indicated above     Objective:    There were no vitals taken for this visit.  Wt Readings from Last 3 Encounters:  05/02/22 148 lb 8 oz (67.4 kg)  11/23/21 157 lb 6.4 oz (71.4 kg)  10/27/21 155 lb 6.4 oz (70.5 kg)    Physical Exam Vitals and nursing note reviewed.  Constitutional:      General: She is awake. She is not in acute distress.    Appearance: She is well-developed. She is not ill-appearing.  HENT:     Head: Normocephalic.     Right Ear: Hearing normal.     Left Ear: Hearing normal.  Eyes:     General: Lids are normal.        Right eye: No discharge.        Left eye: No discharge.     Conjunctiva/sclera: Conjunctivae normal.  Pulmonary:     Effort: Pulmonary effort is normal. No accessory muscle usage or respiratory distress.  Musculoskeletal:     Cervical back: Normal range of motion.  Neurological:     Mental Status: She is alert and oriented to person, place, and time.  Psychiatric:        Attention and Perception: Attention normal.        Mood and Affect: Mood normal.        Behavior: Behavior normal. Behavior is cooperative.        Thought Content: Thought content normal.        Judgment: Judgment normal.     Results for orders placed or performed in visit on 05/02/22  CBC with Differential/Platelet  Result Value Ref Range   WBC 6.5 3.4 - 10.8 x10E3/uL   RBC 4.58 3.77 - 5.28 x10E6/uL   Hemoglobin 14.1 11.1 - 15.9 g/dL   Hematocrit 42.5 95.6 - 46.6 %   MCV 90 79 - 97 fL   MCH 30.8 26.6 - 33.0 pg   MCHC 34.1 31.5 - 35.7 g/dL   RDW 38.7 56.4 - 33.2 %   Platelets 325 150 - 450 x10E3/uL   Neutrophils 57 Not Estab. %   Lymphs 32 Not Estab. %   Monocytes 7 Not Estab. %   Eos 3 Not Estab. %   Basos 1 Not Estab. %    Neutrophils Absolute 3.8 1.4 - 7.0 x10E3/uL   Lymphocytes Absolute 2.1 0.7 - 3.1 x10E3/uL   Monocytes Absolute 0.4 0.1 - 0.9 x10E3/uL   EOS (ABSOLUTE) 0.2 0.0 - 0.4 x10E3/uL   Basophils Absolute 0.1 0.0 - 0.2 x10E3/uL   Immature Granulocytes 0 Not Estab. %  Immature Grans (Abs) 0.0 0.0 - 0.1 x10E3/uL  Comprehensive metabolic panel  Result Value Ref Range   Glucose 83 70 - 99 mg/dL   BUN 10 6 - 20 mg/dL   Creatinine, Ser 0.78 0.57 - 1.00 mg/dL   eGFR 109 >59 mL/min/1.73   BUN/Creatinine Ratio 13 9 - 23   Sodium 138 134 - 144 mmol/L   Potassium 3.8 3.5 - 5.2 mmol/L   Chloride 100 96 - 106 mmol/L   CO2 22 20 - 29 mmol/L   Calcium 9.3 8.7 - 10.2 mg/dL   Total Protein 6.1 6.0 - 8.5 g/dL   Albumin 4.6 4.0 - 5.0 g/dL   Globulin, Total 1.5 1.5 - 4.5 g/dL   Albumin/Globulin Ratio 3.1 (H) 1.2 - 2.2   Bilirubin Total 0.6 0.0 - 1.2 mg/dL   Alkaline Phosphatase 52 44 - 121 IU/L   AST 15 0 - 40 IU/L   ALT 10 0 - 32 IU/L  Lipid Panel w/o Chol/HDL Ratio  Result Value Ref Range   Cholesterol, Total 176 100 - 199 mg/dL   Triglycerides 82 0 - 149 mg/dL   HDL 44 >39 mg/dL   VLDL Cholesterol Cal 15 5 - 40 mg/dL   LDL Chol Calc (NIH) 117 (H) 0 - 99 mg/dL  TSH  Result Value Ref Range   TSH 1.540 0.450 - 4.500 uIU/mL  VITAMIN D 25 Hydroxy (Vit-D Deficiency, Fractures)  Result Value Ref Range   Vit D, 25-Hydroxy 24.8 (L) 30.0 - 100.0 ng/mL  Vitamin B12  Result Value Ref Range   Vitamin B-12 217 (L) 232 - 1,245 pg/mL  Magnesium  Result Value Ref Range   Magnesium 1.9 1.6 - 2.3 mg/dL  HIV antibody (with reflex)  Result Value Ref Range   HIV Screen 4th Generation wRfx Non Reactive Non Reactive  Serum protein electrophoresis with reflex  Result Value Ref Range   Albumin ELP 3.9 2.9 - 4.4 g/dL   Alpha 1 0.2 0.0 - 0.4 g/dL   Alpha 2 0.7 0.4 - 1.0 g/dL   Beta 0.9 0.7 - 1.3 g/dL   Gamma Globulin 0.3 (L) 0.4 - 1.8 g/dL   M-Spike, % Not Observed Not Observed g/dL   Globulin, Total 2.2 2.2 -  3.9 g/dL   A/G Ratio 1.8 (H) 0.7 - 1.7   Please Note: Comment    Interpretation(See Below) Comment   C-reactive protein  Result Value Ref Range   CRP 1 0 - 10 mg/L  Sed Rate (ESR)  Result Value Ref Range   Sed Rate 2 0 - 32 mm/hr  C3 and C4  Result Value Ref Range   Complement C3, Serum 102 82 - 167 mg/dL   Complement C4, Serum 19 12 - 38 mg/dL  Antiphospholipid syndrome eval, bld  Result Value Ref Range   APTT PPP 26.6 22.9 - 30.2 sec   PT 11.0 9.1 - 12.0 sec   INR 1.0 0.9 - 1.2   Thrombin Time 21.0 0.0 - 23.0 sec   Dilute Viper Venom Time 26.6 0.0 - 47.0 sec   Hexagonal Phase Phospholipid 4 0 - 11 sec   Anticardiolipin IgG <9 0 - 14 GPL U/mL   Anticardiolipin IgM <9 0 - 12 MPL U/mL   Beta-2 Glyco I IgG <9 0 - 20 GPI IgG units   Beta-2 Glyco 1 IgM <9 0 - 32 GPI IgM units   APS Panel Interpretation Comment   ANA 12 Plus Profile (RDL)  Result Value Ref  Range   Anti-Nuclear Ab by IFA (RDL) Negative Negative  Coag Studies Interp Report  Result Value Ref Range   Interpretation Note   Anti-Ro/Neg ANA  Result Value Ref Range   Anti-Ro (SS-A) Ab (RDL) <20 <20 Units      Assessment & Plan:   Problem List Items Addressed This Visit       Other   Anxiety    Chronic, stable.  Denies SI/HI.  Continue Zoloft as prescribed by PCP and Xanax as prescribed by neurology.       Relevant Medications   sertraline (ZOLOFT) 50 MG tablet   Depression - Primary    Chronic, stable at this time.  Denies SI/HI.  Continue Zoloft 50 MG daily, higher doses causes numbness + continue Xanax as ordered by neurology.  Scores have improved today.  Return to office in June as scheduled.  May benefit from therapy time in future.      Relevant Medications   sertraline (ZOLOFT) 50 MG tablet   Psychophysiological insomnia    Chronic, ongoing issue.  Has been followed by neurology on this and utilized multiple medications.  Currently taking Xanax as needed.  Recommend therapy, CBT, to work on sleep  issues and mental health.   Discussed trial of Olly Brand Sleep gummies at night to help with sleep, as she would prefer a more natural approach vs adding on more medications.  Continue to collaborate with neurology.       I discussed the assessment and treatment plan with the patient. The patient was provided an opportunity to ask questions and all were answered. The patient agreed with the plan and demonstrated an understanding of the instructions.   The patient was advised to call back or seek an in-person evaluation if the symptoms worsen or if the condition fails to improve as anticipated.   I provided 21+ minutes of time during this encounter.    Follow up plan: Return for as scheduled June 19th.

## 2022-09-11 ENCOUNTER — Encounter: Payer: Self-pay | Admitting: Nurse Practitioner

## 2022-09-12 NOTE — Telephone Encounter (Signed)
Put patient on schedule for 09/13/2022 @ 4:20pm per provider.

## 2022-09-13 ENCOUNTER — Encounter: Payer: Self-pay | Admitting: Nurse Practitioner

## 2022-09-13 ENCOUNTER — Ambulatory Visit (INDEPENDENT_AMBULATORY_CARE_PROVIDER_SITE_OTHER): Payer: Medicaid Other | Admitting: Nurse Practitioner

## 2022-09-13 VITALS — BP 108/76 | HR 98 | Temp 98.0°F | Ht 64.49 in | Wt 135.5 lb

## 2022-09-13 DIAGNOSIS — B9689 Other specified bacterial agents as the cause of diseases classified elsewhere: Secondary | ICD-10-CM | POA: Diagnosis not present

## 2022-09-13 DIAGNOSIS — N760 Acute vaginitis: Secondary | ICD-10-CM

## 2022-09-13 MED ORDER — METRONIDAZOLE 500 MG PO TABS: 500.0000 mg | ORAL_TABLET | Freq: Three times a day (TID) | ORAL | 0 refills | Status: DC

## 2022-09-13 NOTE — Patient Instructions (Signed)

## 2022-09-13 NOTE — Progress Notes (Signed)
BP 108/76 (BP Location: Left Arm, Patient Position: Sitting, Cuff Size: Normal)   Pulse 98   Temp 98 F (36.7 C) (Oral)   Ht 5' 4.49" (1.638 m)   Wt 135 lb 8 oz (61.5 kg)   SpO2 99%   BMI 22.91 kg/m    Subjective:    Patient ID: Jennifer Watts, female    DOB: Jul 27, 1995, 27 y.o.   MRN: [id number]  HPI: Jennifer Watts is a 27 y.o. female  Chief Complaint  Patient presents with   Abdominal Pain    Left side, for past few weeks,    ABDOMINAL PAIN  To left side for past 3 weeks.  History of BV infections and UTIs in past.  Pinching feeling to left side.   Duration:weeks Onset: gradual Severity: 5/10 Quality: aching and pinching Location:  LLQ  Episode duration: notices it most when gets up in morning -- last minutes Radiation: no Frequency: intermittent Alleviating factors: nothing Aggravating factors: getting up in morning Status: stable Treatments attempted: none Fever: no Nausea: no Vomiting: no Weight loss: no Decreased appetite: no Diarrhea: occasional Constipation: occasional Blood in stool: no Heartburn: no Jaundice: no Rash: no Dysuria/urinary frequency: yes Hematuria: no History of sexually transmitted disease: no Recurrent NSAID use: no   Relevant past medical, surgical, family and social history reviewed and updated as indicated. Interim medical history since our last visit reviewed. Allergies and medications reviewed and updated.  Review of Systems  Constitutional:  Negative for activity change, appetite change, diaphoresis, fatigue and fever.  Respiratory:  Negative for cough, chest tightness and shortness of breath.   Cardiovascular:  Negative for chest pain, palpitations and leg swelling.  Gastrointestinal:  Positive for abdominal pain, constipation (occasional) and diarrhea (occasional). Negative for abdominal distention, nausea and vomiting.  Genitourinary:  Positive for dysuria and frequency. Negative for decreased urine volume,  hematuria, urgency and vaginal discharge.  Neurological: Negative.   Psychiatric/Behavioral: Negative.      Per HPI unless specifically indicated above     Objective:    BP 108/76 (BP Location: Left Arm, Patient Position: Sitting, Cuff Size: Normal)   Pulse 98   Temp 98 F (36.7 C) (Oral)   Ht 5' 4.49" (1.638 m)   Wt 135 lb 8 oz (61.5 kg)   SpO2 99%   BMI 22.91 kg/m   Wt Readings from Last 3 Encounters:  09/13/22 135 lb 8 oz (61.5 kg)  05/02/22 148 lb 8 oz (67.4 kg)  11/23/21 157 lb 6.4 oz (71.4 kg)    Physical Exam Vitals and nursing note reviewed.  Constitutional:      General: She is awake. She is not in acute distress.    Appearance: She is well-developed and well-groomed. She is not ill-appearing or toxic-appearing.  HENT:     Head: Normocephalic.     Right Ear: Hearing and external ear normal.     Left Ear: Hearing and external ear normal.  Eyes:     General: Lids are normal.        Right eye: No discharge.        Left eye: No discharge.     Conjunctiva/sclera: Conjunctivae normal.     Pupils: Pupils are equal, round, and reactive to light.  Neck:     Thyroid: No thyromegaly.     Vascular: No carotid bruit.  Cardiovascular:     Rate and Rhythm: Normal rate and regular rhythm.     Heart sounds: Normal heart sounds. No murmur  heard.    No gallop.  Pulmonary:     Effort: Pulmonary effort is normal.     Breath sounds: Normal breath sounds.  Abdominal:     General: Bowel sounds are normal. There is no distension.     Palpations: Abdomen is soft.     Tenderness: There is abdominal tenderness in the suprapubic area and left lower quadrant. There is no right CVA tenderness or left CVA tenderness.  Musculoskeletal:     Cervical back: Normal range of motion and neck supple.     Right lower leg: No edema.     Left lower leg: No edema.  Lymphadenopathy:     Cervical: No cervical adenopathy.  Skin:    General: Skin is warm and dry.  Neurological:     Mental  Status: She is alert and oriented to person, place, and time.  Psychiatric:        Attention and Perception: Attention normal.        Mood and Affect: Mood normal.        Speech: Speech normal.        Behavior: Behavior normal. Behavior is cooperative.        Thought Content: Thought content normal.     Results for orders placed or performed in visit on 05/02/22  CBC with Differential/Platelet  Result Value Ref Range   WBC 6.5 3.4 - 10.8 x10E3/uL   RBC 4.58 3.77 - 5.28 x10E6/uL   Hemoglobin 14.1 11.1 - 15.9 g/dL   Hematocrit 19.1 47.8 - 46.6 %   MCV 90 79 - 97 fL   MCH 30.8 26.6 - 33.0 pg   MCHC 34.1 31.5 - 35.7 g/dL   RDW 29.5 62.1 - 30.8 %   Platelets 325 150 - 450 x10E3/uL   Neutrophils 57 Not Estab. %   Lymphs 32 Not Estab. %   Monocytes 7 Not Estab. %   Eos 3 Not Estab. %   Basos 1 Not Estab. %   Neutrophils Absolute 3.8 1.4 - 7.0 x10E3/uL   Lymphocytes Absolute 2.1 0.7 - 3.1 x10E3/uL   Monocytes Absolute 0.4 0.1 - 0.9 x10E3/uL   EOS (ABSOLUTE) 0.2 0.0 - 0.4 x10E3/uL   Basophils Absolute 0.1 0.0 - 0.2 x10E3/uL   Immature Granulocytes 0 Not Estab. %   Immature Grans (Abs) 0.0 0.0 - 0.1 x10E3/uL  Comprehensive metabolic panel  Result Value Ref Range   Glucose 83 70 - 99 mg/dL   BUN 10 6 - 20 mg/dL   Creatinine, Ser 6.57 0.57 - 1.00 mg/dL   eGFR 846 >96 EX/BMW/4.13   BUN/Creatinine Ratio 13 9 - 23   Sodium 138 134 - 144 mmol/L   Potassium 3.8 3.5 - 5.2 mmol/L   Chloride 100 96 - 106 mmol/L   CO2 22 20 - 29 mmol/L   Calcium 9.3 8.7 - 10.2 mg/dL   Total Protein 6.1 6.0 - 8.5 g/dL   Albumin 4.6 4.0 - 5.0 g/dL   Globulin, Total 1.5 1.5 - 4.5 g/dL   Albumin/Globulin Ratio 3.1 (H) 1.2 - 2.2   Bilirubin Total 0.6 0.0 - 1.2 mg/dL   Alkaline Phosphatase 52 44 - 121 IU/L   AST 15 0 - 40 IU/L   ALT 10 0 - 32 IU/L  Lipid Panel w/o Chol/HDL Ratio  Result Value Ref Range   Cholesterol, Total 176 100 - 199 mg/dL   Triglycerides 82 0 - 149 mg/dL   HDL 44 >24 mg/dL   VLDL  Cholesterol Cal  15 5 - 40 mg/dL   LDL Chol Calc (NIH) 161 (H) 0 - 99 mg/dL  TSH  Result Value Ref Range   TSH 1.540 0.450 - 4.500 uIU/mL  VITAMIN D 25 Hydroxy (Vit-D Deficiency, Fractures)  Result Value Ref Range   Vit D, 25-Hydroxy 24.8 (L) 30.0 - 100.0 ng/mL  Vitamin B12  Result Value Ref Range   Vitamin B-12 217 (L) 232 - 1,245 pg/mL  Magnesium  Result Value Ref Range   Magnesium 1.9 1.6 - 2.3 mg/dL  HIV antibody (with reflex)  Result Value Ref Range   HIV Screen 4th Generation wRfx Non Reactive Non Reactive  Serum protein electrophoresis with reflex  Result Value Ref Range   Albumin ELP 3.9 2.9 - 4.4 g/dL   Alpha 1 0.2 0.0 - 0.4 g/dL   Alpha 2 0.7 0.4 - 1.0 g/dL   Beta 0.9 0.7 - 1.3 g/dL   Gamma Globulin 0.3 (L) 0.4 - 1.8 g/dL   M-Spike, % Not Observed Not Observed g/dL   Globulin, Total 2.2 2.2 - 3.9 g/dL   A/G Ratio 1.8 (H) 0.7 - 1.7   Please Note: Comment    Interpretation(See Below) Comment   C-reactive protein  Result Value Ref Range   CRP 1 0 - 10 mg/L  Sed Rate (ESR)  Result Value Ref Range   Sed Rate 2 0 - 32 mm/hr  C3 and C4  Result Value Ref Range   Complement C3, Serum 102 82 - 167 mg/dL   Complement C4, Serum 19 12 - 38 mg/dL  Antiphospholipid syndrome eval, bld  Result Value Ref Range   APTT PPP 26.6 22.9 - 30.2 sec   PT 11.0 9.1 - 12.0 sec   INR 1.0 0.9 - 1.2   Thrombin Time 21.0 0.0 - 23.0 sec   Dilute Viper Venom Time 26.6 0.0 - 47.0 sec   Hexagonal Phase Phospholipid 4 0 - 11 sec   Anticardiolipin IgG <9 0 - 14 GPL U/mL   Anticardiolipin IgM <9 0 - 12 MPL U/mL   Beta-2 Glyco I IgG <9 0 - 20 GPI IgG units   Beta-2 Glyco 1 IgM <9 0 - 32 GPI IgM units   APS Panel Interpretation Comment   ANA 12 Plus Profile (RDL)  Result Value Ref Range   Anti-Nuclear Ab by IFA (RDL) Negative Negative  Coag Studies Interp Report  Result Value Ref Range   Interpretation Note   Anti-Ro/Neg ANA  Result Value Ref Range   Anti-Ro (SS-A) Ab (RDL) <20 <20 Units       Assessment & Plan:   Problem List Items Addressed This Visit       Genitourinary   Bacterial vaginosis - Primary    Acute with symptoms for 3 weeks.  Wet prep + clue cells, negative yeast and trich.  UA overall stable and reassuring.  Discussed at length BV with patient.  Will start Flagyl 500 MG BID for 7 days, she will alert provider if does not tolerate and will change to gel.  Return if worsening or ongoing symptoms.      Relevant Medications   metroNIDAZOLE (FLAGYL) 500 MG tablet   Other Relevant Orders   Urinalysis, Routine w reflex microscopic   WET PREP FOR TRICH, YEAST, CLUE     Follow up plan: Return if symptoms worsen or fail to improve.

## 2022-09-13 NOTE — Assessment & Plan Note (Signed)
Acute with symptoms for 3 weeks.  Wet prep + clue cells, negative yeast and trich.  UA overall stable and reassuring.  Discussed at length BV with patient.  Will start Flagyl 500 MG BID for 7 days, she will alert provider if does not tolerate and will change to gel.  Return if worsening or ongoing symptoms.

## 2022-09-14 LAB — URINALYSIS, ROUTINE W REFLEX MICROSCOPIC
Bilirubin, UA: NEGATIVE
Glucose, UA: NEGATIVE
Ketones, UA: NEGATIVE
Leukocytes,UA: NEGATIVE
Nitrite, UA: NEGATIVE
Protein,UA: NEGATIVE
RBC, UA: NEGATIVE
Specific Gravity, UA: <1.005 — ABNORMAL LOW (ref 1.005–1.030)
Urobilinogen, Ur: 0.2 mg/dL (ref 0.2–1.0)
pH, UA: 5.5 (ref 5.0–7.5)

## 2022-09-14 LAB — WET PREP FOR TRICH, YEAST, CLUE
Clue Cell Exam: POSITIVE — AB
Trichomonas Exam: NEGATIVE
Yeast Exam: NEGATIVE

## 2022-09-14 MED ORDER — METRONIDAZOLE 0.75 % VA GEL: 1.0000 | Freq: Every day | VAGINAL | 0 refills | Status: AC

## 2022-09-14 NOTE — Addendum Note (Signed)
Addended by: Aura Dials T on: 09/14/2022 07:12 PM   Modules accepted: Orders

## 2022-09-18 ENCOUNTER — Encounter: Payer: Self-pay | Admitting: Nurse Practitioner

## 2022-09-18 MED ORDER — HYDROXYZINE PAMOATE 25 MG PO CAPS: 25.0000 mg | ORAL_CAPSULE | Freq: Three times a day (TID) | ORAL | 0 refills | Status: DC | PRN

## 2022-10-06 ENCOUNTER — Telehealth: Payer: Medicaid Other | Admitting: Nurse Practitioner

## 2022-10-06 ENCOUNTER — Encounter: Payer: Self-pay | Admitting: Nurse Practitioner

## 2022-10-06 ENCOUNTER — Ambulatory Visit: Payer: Self-pay

## 2022-10-06 DIAGNOSIS — R110 Nausea: Secondary | ICD-10-CM

## 2022-10-06 DIAGNOSIS — R197 Diarrhea, unspecified: Secondary | ICD-10-CM | POA: Diagnosis not present

## 2022-10-06 MED ORDER — AZITHROMYCIN 250 MG PO TABS
ORAL_TABLET | ORAL | 0 refills | Status: AC
Start: 2022-10-06 — End: 2022-10-11

## 2022-10-06 MED ORDER — ONDANSETRON 4 MG PO TBDP
4.0000 mg | ORAL_TABLET | Freq: Three times a day (TID) | ORAL | 0 refills | Status: DC | PRN
Start: 2022-10-06 — End: 2022-11-26

## 2022-10-06 NOTE — Telephone Encounter (Signed)
Noted  

## 2022-10-06 NOTE — Telephone Encounter (Signed)
  Chief Complaint: abdominal pain Symptoms: lower abdominal pain, constant, 7/10, diarrhea, vomiting on Tues but none since, dizziness Frequency: Tuesday Pertinent Negatives: Patient denies vomiting today Disposition: [] ED /[] Urgent Care (no appt availability in office) / [] Appointment(In office/virtual)/ [x]  [facility]/ [] Home Care/ [] Refused Recommended Disposition /[] [facility]/ []  Follow-up with PCP Additional Notes: pt states she has been having intense cramping since Tuesday with other x as well. Took some Pepto starting Tuesday but none today d/t not really helping with sx. D/t it being late and OV not available this afternoon advised UC. Pt prefers to do virtual UC in case she has to use bathroom. Walked to thru self scheduling virtual UC visit for today at 1645. Care advice given and pt verbalized understanding. No other questions/concerns noted.   Reason for Disposition  [1] MODERATE pain (e.g., interferes with normal activities) AND [2] pain comes and goes (cramps) AND [3] present > 24 hours  (Exception: Pain with Vomiting or Diarrhea - see that Guideline.)  Answer Assessment - Initial Assessment Questions 1. LOCATION: "Where does it hurt?"      Lower abdomen 3. ONSET: "When did the pain begin?" (e.g., minutes, hours or days ago)      Tuesday  5. PATTERN "Does the pain come and go, or is it constant?"    - If it comes and goes: "How long does it last?" "Do you have pain now?"     (Note: Comes and goes means the pain is intermittent. It goes away completely between bouts.)    - If constant: "Is it getting better, staying the same, or getting worse?"      (Note: Constant means the pain never goes away completely; most serious pain is constant and gets worse.)      Constant  6. SEVERITY: "How bad is the pain?"  (e.g., Scale 1-10; mild, moderate, or severe)    - MILD (1-3): Doesn't interfere with normal activities, abdomen soft and not tender to touch.     -  MODERATE (4-7): Interferes with normal activities or awakens from sleep, abdomen tender to touch.     - SEVERE (8-10): Excruciating pain, doubled over, unable to do any normal activities.       7/10 10. OTHER SYMPTOMS: "Do you have any other symptoms?" (e.g., back pain, diarrhea, fever, urination pain, vomiting)       Vomiting Tuesday, decreased appetite, diarrhea, slight dizziness  Protocols used: Abdominal Pain - Female-A-AH

## 2022-10-06 NOTE — Progress Notes (Signed)
Virtual Visit Consent   Jennifer Watts, you are scheduled for a virtual visit with a [facility] provider today. Just as with appointments in the office, your consent must be obtained to participate. Your consent will be active for this visit and any virtual visit you may have with one of our providers in the next 365 days. If you have a MyChart account, a copy of this consent can be sent to you electronically.  As this is a virtual visit, video technology does not allow for your provider to perform a traditional examination. This may limit your provider's ability to fully assess your condition. If your provider identifies any concerns that need to be evaluated in person or the need to arrange testing (such as labs, EKG, etc.), we will make arrangements to do so. Although advances in technology are sophisticated, we cannot ensure that it will always work on either your end or our end. If the connection with a video visit is poor, the visit may have to be switched to a telephone visit. With either a video or telephone visit, we are not always able to ensure that we have a secure connection.  By engaging in this virtual visit, you consent to the provision of healthcare and authorize for your insurance to be billed (if applicable) for the services provided during this visit. Depending on your insurance coverage, you may receive a charge related to this service.  I need to obtain your verbal consent now. Are you willing to proceed with your visit today? Jennifer Watts has provided verbal consent on 10/06/2022 for a virtual visit (video or telephone). Viviano Simas, FNP  Date: 10/06/2022 4:32 PM  Virtual Visit via Video Note   I, Viviano Simas, connected with  Jennifer Watts  ([id number], 1995/10/08) on 10/06/22 at  4:45 PM EDT by a video-enabled telemedicine application and verified that I am speaking with the correct person using two identifiers.  Location: Patient: Virtual Visit Location Patient:  Home Provider: Virtual Visit Location Provider: Home Office   I discussed the limitations of evaluation and management by telemedicine and the availability of in person appointments. The patient expressed understanding and agreed to proceed.    History of Present Illness: Jennifer Watts is a 27 y.o. who identifies as a female who was assigned female at birth, and is being seen today for diarrhea and abdominal cramping   She wakes at night to have BM, she goes more often in the day than just when she eats or drinks   She started to have symptoms 4 days ago  Started with diarrhea and abdominal cramping  The first night she was up with vomiting and diarrhea  Possible fever at that time  Has not vomited since day 1  Diarrhea has persisted Abdominal cramping has persisted Cramping is isolated to lower bilateral abdominal region without localized pain  Has not had colonoscopy in the past   She has been pushing her fluids and staying hydrated but has not had an appetite   She has not traveled anywhere recently  She is not in college or at working currently  No sick contacts   Denies any other symptoms of ST, cough or congestion   Last meal prior to symptom onset -unsure   Has IUD    Problems:  Patient Active Problem List   Diagnosis Date Noted   IUD (intrauterine device) in place 04/30/2022   Psychophysiological insomnia 11/23/2021   Bacterial vaginosis 10/27/2021   Vitamin B12  deficiency 04/13/2021   Eczema 07/11/2019   Heart burn 01/27/2019   Migraine without aura and without status migrainosus, not intractable 03/14/2017   Nicotine dependence, cigarettes, uncomplicated 07/08/2015   Multiple sclerosis (HCC) 10/26/2014   Depression 02/27/2014   Anxiety 01/20/2014   Vitamin D deficiency 12/29/2013    Allergies:  Allergies  Allergen Reactions   Reglan [Metoclopramide] Other (See Comments)    Hyperactivity /becomes mean   Ibuprofen Rash   Penicillins Rash    Has  patient had a PCN reaction causing immediate rash, facial/tongue/throat swelling, SOB or lightheadedness with hypotension: No Has patient had a PCN reaction causing severe rash involving mucus membranes or skin necrosis: No Has patient had a PCN reaction that required hospitalization: No Has patient had a PCN reaction occurring within the last 10 years: No If all of the above answers are "NO", then may proceed with Cephalosporin use.    Sulfa Antibiotics Rash   Sulfasalazine Rash   Medications:  Current Outpatient Medications:    ALPRAZolam (XANAX) 0.5 MG tablet, Take 0.5 mg by mouth at bedtime as needed for anxiety., Disp: , Rfl:    cholecalciferol (VITAMIN D3) 25 MCG (1000 UT) tablet, Take 1,000 Units by mouth daily., Disp: , Rfl:    clobetasol cream (TEMOVATE) 0.05 %, APPLY TO AFFECTED AREA TWICE A DAY, Disp: 30 g, Rfl: 4   cyanocobalamin (,VITAMIN B-12,) 1000 MCG/ML injection, Inject into the muscle., Disp: , Rfl:    Cyanocobalamin (B-12 PO), Take 1 tablet by mouth daily., Disp: , Rfl:    hydrOXYzine (VISTARIL) 25 MG capsule, Take 1 capsule (25 mg total) by mouth 3 (three) times daily as needed., Disp: 30 capsule, Rfl: 0   linaclotide (LINZESS) 290 MCG CAPS capsule, Take 1 capsule (290 mcg total) by mouth daily., Disp: 90 capsule, Rfl: 4   omeprazole (PRILOSEC) 20 MG capsule, Take 1 capsule (20 mg total) by mouth daily., Disp: 90 capsule, Rfl: 4   ondansetron (ZOFRAN) 4 MG tablet, Take 1 tablet (4 mg total) by mouth every 8 (eight) hours as needed for nausea or vomiting., Disp: 20 tablet, Rfl: 0   promethazine (PHENERGAN) 12.5 MG tablet, Take 1 tablet (12.5 mg total) by mouth every 8 (eight) hours as needed for nausea or vomiting., Disp: 35 tablet, Rfl: 0   sertraline (ZOLOFT) 50 MG tablet, Take 1 tablet (50 mg total) by mouth daily., Disp: 90 tablet, Rfl: 4   SUMAtriptan (IMITREX) 100 MG tablet, Take 50-100 mg by mouth daily as needed., Disp: , Rfl:  No current facility-administered  medications for this visit.  Facility-Administered Medications Ordered in Other Visits:    acetaminophen (TYLENOL) tablet 1,000 mg, 1,000 mg, Oral, Once, Finnegan, Tollie Pizza, MD  Observations/Objective: Patient is well-developed, well-nourished in no acute distress.  Resting comfortably  at home.  Head is normocephalic, atraumatic.  No labored breathing.  Speech is clear and coherent with logical content.  Patient is alert and oriented at baseline.    Assessment and Plan: 1. Diarrhea of presumed infectious origin  - azithromycin (ZITHROMAX) 250 MG tablet; Take 2 tablets on day 1, then 1 tablet daily on days 2 through 5  Dispense: 6 tablet; Refill: 0  2. Nausea  - ondansetron (ZOFRAN-ODT) 4 MG disintegrating tablet; Take 1 tablet (4 mg total) by mouth every 8 (eight) hours as needed for nausea or vomiting.  Dispense: 20 tablet; Refill: 0    Encouraged follow up for persistent symptoms  Discussed when she would need in person for hydration  assistance  Start with fluids and electrolyte replacement like Pedialyte  Slowly progress foods starting with bland BRAT diet   Follow Up Instructions: I discussed the assessment and treatment plan with the patient. The patient was provided an opportunity to ask questions and all were answered. The patient agreed with the plan and demonstrated an understanding of the instructions.  A copy of instructions were sent to the patient via MyChart unless otherwise noted below.    The patient was advised to call back or seek an in-person evaluation if the symptoms worsen or if the condition fails to improve as anticipated.  Time:  I spent 10 minutes with the patient via telehealth technology discussing the above problems/concerns.    Viviano Simas, FNP

## 2022-10-13 ENCOUNTER — Telehealth (INDEPENDENT_AMBULATORY_CARE_PROVIDER_SITE_OTHER): Payer: Medicaid Other | Admitting: Family Medicine

## 2022-10-13 ENCOUNTER — Encounter: Payer: Self-pay | Admitting: Family Medicine

## 2022-10-13 DIAGNOSIS — K591 Functional diarrhea: Secondary | ICD-10-CM

## 2022-10-13 NOTE — Progress Notes (Signed)
There were no vitals taken for this visit.   Subjective:    Patient ID: Jennifer Watts, female    DOB: 04/15/1996, 27 y.o.   MRN: [id number]  HPI: Jennifer Watts is a 27 y.o. female  Chief Complaint  Patient presents with   GI Problem    Patient says she has been experiencing some cramping, and has went from constipation to diarrhea. Patient says she has been experiencing GI issues for several months since February. Patient says she thought thought it was Anxiety induced. Patient says she BM once every few days.    Bloated   ABDOMINAL ISSUES- had a stomach bug about 2 weeks ago, felt better about 4 days ago, but hasn't had a normal bowel movement Duration: since February Nature: bloating, "rocks in her belly" Location: diffuse  Severity: mild  Radiation: no Frequency: intermittent Alleviating factors: nothing Aggravating factors: nothing Treatments attempted: ib-guard, pepto Constipation: intermittent Diarrhea: yes Mucous in the stool: no Heartburn: yes Bloating:yes Flatulence: yes Nausea: yes Vomiting: yes Episodes of vomit/day: Melena or hematochezia:  unsure, very dark stools Rash: no- but itching and pin-prick sensation Jaundice: no Fever: no Weight loss: yes- came off depo, not trying  Relevant past medical, surgical, family and social history reviewed and updated as indicated. Interim medical history since our last visit reviewed. Allergies and medications reviewed and updated.  Review of Systems  Constitutional: Negative.   Respiratory: Negative.    Cardiovascular: Negative.   Gastrointestinal:  Positive for abdominal distention, abdominal pain, constipation, nausea and vomiting. Negative for anal bleeding, blood in stool, diarrhea and rectal pain.  Genitourinary: Negative.   Musculoskeletal: Negative.   Skin: Negative.   Neurological: Negative.   Psychiatric/Behavioral: Negative.      Per HPI unless specifically indicated above     Objective:     There were no vitals taken for this visit.  Wt Readings from Last 3 Encounters:  09/13/22 135 lb 8 oz (61.5 kg)  05/02/22 148 lb 8 oz (67.4 kg)  11/23/21 157 lb 6.4 oz (71.4 kg)    Physical Exam Vitals and nursing note reviewed.  Constitutional:      General: She is not in acute distress.    Appearance: Normal appearance. She is normal weight. She is not ill-appearing, toxic-appearing or diaphoretic.  HENT:     Head: Normocephalic and atraumatic.     Right Ear: External ear normal.     Left Ear: External ear normal.     Nose: Nose normal.     Mouth/Throat:     Mouth: Mucous membranes are moist.     Pharynx: Oropharynx is clear.  Eyes:     General: No scleral icterus.       Right eye: No discharge.        Left eye: No discharge.     Conjunctiva/sclera: Conjunctivae normal.     Pupils: Pupils are equal, round, and reactive to light.  Pulmonary:     Effort: Pulmonary effort is normal. No respiratory distress.     Comments: Speaking in full sentences Musculoskeletal:        General: Normal range of motion.     Cervical back: Normal range of motion.  Skin:    Coloration: Skin is not jaundiced or pale.     Findings: No bruising, erythema, lesion or rash.  Neurological:     Mental Status: She is alert and oriented to person, place, and time. Mental status is at baseline.  Psychiatric:  Mood and Affect: Mood normal.        Behavior: Behavior normal.        Thought Content: Thought content normal.        Judgment: Judgment normal.     Results for orders placed or performed in visit on 09/13/22  WET PREP FOR TRICH, YEAST, CLUE   Specimen: Urine   Urine  Result Value Ref Range   Trichomonas Exam Negative Negative   Yeast Exam Negative Negative   Clue Cell Exam Positive (A) Negative  Urinalysis, Routine w reflex microscopic  Result Value Ref Range   Specific Gravity, UA <1.005 (L) 1.005 - 1.030   pH, UA 5.5 5.0 - 7.5   Color, UA Yellow Yellow   Appearance Ur Clear  Clear   Leukocytes,UA Negative Negative   Protein,UA Negative Negative/Trace   Glucose, UA Negative Negative   Ketones, UA Negative Negative   RBC, UA Negative Negative   Bilirubin, UA Negative Negative   Urobilinogen, Ur 0.2 0.2 - 1.0 mg/dL   Nitrite, UA Negative Negative   Microscopic Examination Comment       Assessment & Plan:   Problem List Items Addressed This Visit   None Visit Diagnoses     Functional diarrhea    -  Primary   Will check labs and start fiber. Consider getting her back into GI. Follow up with PCP as scheduled in 2.5 weeks.   Relevant Orders   CBC with Differential/Platelet   Comprehensive metabolic panel   TSH   Gliadin antibodies, serum   Tissue transglutaminase, IgA   Reticulin Antibody, IgA w reflex titer   Fecal occult blood, imunochemical([facility])        Follow up plan: Return as scheduled.    This visit was completed via video visit through MyChart due to the restrictions of the COVID-19 pandemic. All issues as above were discussed and addressed. Physical exam was done as above through visual confirmation on video through MyChart. If it was felt that the patient should be evaluated in the office, they were directed there. The patient verbally consented to this visit. Location of the patient: home Location of the provider: work Those involved with this call:  Provider: Olevia Perches, DO CMA: Malen Gauze, CMA Front Desk/Registration:  Servando Snare   Time spent on call:  15 minutes with patient face to face via video conference. More than 50% of this time was spent in counseling and coordination of care. 23 minutes total spent in review of patient's record and preparation of their chart.

## 2022-10-16 ENCOUNTER — Other Ambulatory Visit: Payer: Medicaid Other

## 2022-10-16 ENCOUNTER — Encounter: Payer: Self-pay | Admitting: Family Medicine

## 2022-10-16 DIAGNOSIS — K591 Functional diarrhea: Secondary | ICD-10-CM

## 2022-10-17 LAB — COMPREHENSIVE METABOLIC PANEL
Albumin: 4.7 g/dL (ref 4.0–5.0)
Alkaline Phosphatase: 56 IU/L (ref 44–121)
BUN: 10 mg/dL (ref 6–20)
Creatinine, Ser: 0.74 mg/dL (ref 0.57–1.00)
Globulin, Total: 1.8 g/dL (ref 1.5–4.5)
Potassium: 4.2 mmol/L (ref 3.5–5.2)

## 2022-10-17 LAB — CBC WITH DIFFERENTIAL/PLATELET
Basos: 1 %
Eos: 2 %
Hemoglobin: 14.6 g/dL (ref 11.1–15.9)
Immature Granulocytes: 0 %
MCHC: 33.6 g/dL (ref 31.5–35.7)
Monocytes Absolute: 0.5 10*3/uL (ref 0.1–0.9)

## 2022-10-17 LAB — GLIADIN ANTIBODIES, SERUM: Gliadin IgG: 2 units (ref 0–19)

## 2022-10-18 ENCOUNTER — Encounter: Payer: Self-pay | Admitting: Family Medicine

## 2022-10-18 DIAGNOSIS — K591 Functional diarrhea: Secondary | ICD-10-CM

## 2022-10-18 DIAGNOSIS — R197 Diarrhea, unspecified: Secondary | ICD-10-CM

## 2022-10-18 LAB — COMPREHENSIVE METABOLIC PANEL
ALT: 8 IU/L (ref 0–32)
AST: 12 IU/L (ref 0–40)
Albumin/Globulin Ratio: 2.6 — ABNORMAL HIGH (ref 1.2–2.2)
BUN/Creatinine Ratio: 14 (ref 9–23)
Bilirubin Total: 0.7 mg/dL (ref 0.0–1.2)
CO2: 22 mmol/L (ref 20–29)
Calcium: 9.7 mg/dL (ref 8.7–10.2)
Chloride: 104 mmol/L (ref 96–106)
Glucose: 85 mg/dL (ref 70–99)
Sodium: 138 mmol/L (ref 134–144)
Total Protein: 6.5 g/dL (ref 6.0–8.5)
eGFR: 115 mL/min/{1.73_m2} (ref >59)

## 2022-10-18 LAB — CBC WITH DIFFERENTIAL/PLATELET
Basophils Absolute: 0.1 10*3/uL (ref 0.0–0.2)
EOS (ABSOLUTE): 0.2 10*3/uL (ref 0.0–0.4)
Hematocrit: 43.5 % (ref 34.0–46.6)
Immature Grans (Abs): 0 10*3/uL (ref 0.0–0.1)
Lymphocytes Absolute: 2.8 10*3/uL (ref 0.7–3.1)
Lymphs: 29 %
MCH: 30.2 pg (ref 26.6–33.0)
MCV: 90 fL (ref 79–97)
Monocytes: 6 %
Neutrophils Absolute: 6.1 10*3/uL (ref 1.4–7.0)
Neutrophils: 62 %
Platelets: 396 10*3/uL (ref 150–450)
RBC: 4.83 x10E6/uL (ref 3.77–5.28)
RDW: 12.1 % (ref 11.7–15.4)
WBC: 9.7 10*3/uL (ref 3.4–10.8)

## 2022-10-18 LAB — RETICULIN ANTIBODIES, IGA W TITER: Reticulin Ab, IgA: NEGATIVE {titer} (ref <2.5)

## 2022-10-18 LAB — TISSUE TRANSGLUTAMINASE, IGA: Transglutaminase IgA: <2 U/mL (ref 0–3)

## 2022-10-18 LAB — TSH: TSH: 1.39 u[IU]/mL (ref 0.450–4.500)

## 2022-10-18 LAB — GLIADIN ANTIBODIES, SERUM: Antigliadin Abs, IgA: 4 units (ref 0–19)

## 2022-10-19 LAB — FECAL OCCULT BLOOD, IMMUNOCHEMICAL: Fecal Occult Bld: NEGATIVE

## 2022-10-19 NOTE — Progress Notes (Signed)
Contacted via MyChart   No blood in stool.  Good news!!

## 2022-10-25 ENCOUNTER — Encounter: Payer: Self-pay | Admitting: Nurse Practitioner

## 2022-11-01 ENCOUNTER — Encounter: Payer: Self-pay | Admitting: Nurse Practitioner

## 2022-11-01 ENCOUNTER — Encounter: Payer: Self-pay | Admitting: Gastroenterology

## 2022-11-01 ENCOUNTER — Telehealth (INDEPENDENT_AMBULATORY_CARE_PROVIDER_SITE_OTHER): Payer: Medicaid Other | Admitting: Nurse Practitioner

## 2022-11-01 ENCOUNTER — Ambulatory Visit: Payer: Medicaid Other | Admitting: Nurse Practitioner

## 2022-11-01 ENCOUNTER — Ambulatory Visit (INDEPENDENT_AMBULATORY_CARE_PROVIDER_SITE_OTHER): Payer: Medicaid Other | Admitting: Gastroenterology

## 2022-11-01 VITALS — BP 128/89 | HR 101 | Temp 98.9°F | Ht 64.49 in | Wt 126.5 lb

## 2022-11-01 DIAGNOSIS — R1013 Epigastric pain: Secondary | ICD-10-CM | POA: Diagnosis not present

## 2022-11-01 DIAGNOSIS — R1084 Generalized abdominal pain: Secondary | ICD-10-CM

## 2022-11-01 DIAGNOSIS — F330 Major depressive disorder, recurrent, mild: Secondary | ICD-10-CM

## 2022-11-01 DIAGNOSIS — R198 Other specified symptoms and signs involving the digestive system and abdomen: Secondary | ICD-10-CM | POA: Diagnosis not present

## 2022-11-01 DIAGNOSIS — F5104 Psychophysiologic insomnia: Secondary | ICD-10-CM | POA: Diagnosis not present

## 2022-11-01 DIAGNOSIS — G35 Multiple sclerosis: Secondary | ICD-10-CM | POA: Diagnosis not present

## 2022-11-01 MED ORDER — DULOXETINE HCL 30 MG PO CPEP: ORAL_CAPSULE | ORAL | 4 refills | Status: DC

## 2022-11-01 NOTE — Patient Instructions (Signed)
Gave Linzess 72 samples. Please take 1 capsule every morning. Let us know how they work and we can call you in a script for it.

## 2022-11-01 NOTE — Assessment & Plan Note (Signed)
Chronic, ongoing.  Continue to collaborate with neurology team, recent notes reviewed. 

## 2022-11-01 NOTE — Assessment & Plan Note (Signed)
Chronic, ongoing issue.  Has been followed by neurology on this and utilized multiple medications.  Currently taking Xanax as needed.  Recommend therapy, CBT, to work on sleep issues and mental health.   Discussed trial of Olly Brand Sleep gummies at night to help with sleep, as she would prefer a more natural approach vs adding on more medications.  Continue to collaborate with neurology. 

## 2022-11-01 NOTE — Patient Instructions (Signed)
Abdominal Pain, Adult  Many things can cause belly (abdominal) pain. In most cases, belly pain is not a serious problem and can be watched and treated at home. But in some cases, it can be serious. Your doctor will try to find the cause of your belly pain. Follow these instructions at home: Medicines Take over-the-counter and prescription medicines only as told by your doctor. Do not take medicines that help you poop (laxatives) unless told by your doctor. General instructions Watch your belly pain for any changes. Tell your doctor if the pain gets worse. Drink enough fluid to keep your pee (urine) pale yellow. Contact a doctor if: Your belly pain changes or gets worse. You have very bad cramping or bloating in your belly. You vomit. Your pain gets worse with meals, after eating, or with certain foods. You have trouble pooping or have watery poop for more than 2-3 days. You are not hungry, or you lose weight without trying. You have signs of not getting enough fluid or water (dehydration). These may include: Dark pee, very little pee, or no pee. Cracked lips or dry mouth. Feeling sleepy or weak. You have pain when you pee or poop. Your belly pain wakes you up at night. You have blood in your pee. You have a fever. Get help right away if: You cannot stop vomiting. Your pain is only in one part of your belly, like on the right side. You have bloody or black poop, or poop that looks like tar. You have trouble breathing. You have chest pain. These symptoms may be an emergency. Get help right away. Call 911. Do not wait to see if the symptoms will go away. Do not drive yourself to the hospital. This information is not intended to replace advice given to you by your health care provider. Make sure you discuss any questions you have with your health care provider. Document Revised: 02/15/2022 Document Reviewed: 02/15/2022 Elsevier Patient Education  2024 [employer]  

## 2022-11-01 NOTE — Progress Notes (Signed)
Attempted to reach patient, LVM to call office back to get scheduled for her 4 week follow up regarding Mood and Abd Pain.  Put in CRM.

## 2022-11-01 NOTE — Progress Notes (Signed)
There were no vitals taken for this visit.   Subjective:    Patient ID: Jennifer Watts, female    DOB: 08-16-1995, 27 y.o.   MRN: [id number]  HPI: Jennifer Watts is a 27 y.o. female  Chief Complaint  Patient presents with   Mood   Multiple Sclerosis   Abdominal Pain   This visit was completed via video visit through MyChart due to the restrictions of the COVID-19 pandemic. All issues as above were discussed and addressed. Physical exam was done as above through visual confirmation on video through MyChart. If it was felt that the patient should be evaluated in the office, they were directed there. The patient verbally consented to this visit. Location of the patient: home Location of the provider: work Those involved with this call:  Provider: Aura Dials, DNP CMA: Tristan Schroeder, CMA Front Desk/Registration: [employer]  Time spent on call:  21 minutes with patient face to face via video conference. More than 50% of this time was spent in counseling and coordination of care. 15 minutes total spent in review of patient's record and preparation of their chart.  I verified patient identity using two factors (patient name and date of birth). Patient consents verbally to being seen via telemedicine visit today.    DEPRESSION Follow-up for mood today. Current medications include Zoloft 50 MG daily prescribed by PCP and Xanax 0.5 MG PRN at bedtime prescribed by neurology.  Feels lack of sleep is causing issues, feels more anxious with lack of sleep -- has tried Nortriptyline and Trazodone in past with side effects.  Feels she is not sleeping due to itching, could not fall asleep last night because she was itching so bad.  The pruritus has been present for awhile, has not tried Sarna lotion or Vistaril.  The itching is exacerbated by showering or getting warm + lying down to go to bed.  There are no rashes with this.  Has not seen dermatologist.  Itching presents mainly yo arms and legs  -- very uncomfortable pin prick feeling.    Has MS and continues to be followed by neurology with last visit on 04/24/22.  No current treatments.   Mood status: stable Satisfied with current treatment?: yes Symptom severity: moderate  Duration of current treatment : chronic Side effects: no Medication compliance: good compliance Psychotherapy/counseling: in past Depressed mood: yes Anxious mood: yes Anhedonia: no Significant weight loss or gain: no Insomnia: yes hard to fall asleep Fatigue: yes Feelings of worthlessness or guilt: no Impaired concentration/indecisiveness: no Suicidal ideations: no Hopelessness: no Crying spells: no    11/01/2022   10:48 AM 08/30/2022    4:10 PM 07/19/2022    2:10 PM 05/02/2022   11:11 AM 03/27/2022    4:17 PM  Depression screen PHQ 2/9  Decreased Interest 1 1 2 2 2   Down, Depressed, Hopeless 0 0 1 0 0  PHQ - 2 Score 1 1 3 2 2   Altered sleeping 0 3 3 2 3   Tired, decreased energy 1 1 3 2 2   Change in appetite 1 0 0 0 0  Feeling bad or failure about yourself  0 0 1 0 0  Trouble concentrating 0 1 1 1  0  Moving slowly or fidgety/restless 1 0 0 0 0  Suicidal thoughts 0 0 0 0 0  PHQ-9 Score 4 6 11 7 7   Difficult doing work/chores Not difficult at all Not difficult at all Not difficult at all Not difficult at all  Somewhat difficult       11/01/2022   10:50 AM 08/30/2022    4:12 PM 07/19/2022    2:11 PM 05/02/2022   11:11 AM  GAD 7 : Generalized Anxiety Score  Nervous, Anxious, on Edge 1 0 2 1  Control/stop worrying 0 0 1 0  Worry too much - different things 0 0 1 0  Trouble relaxing 1 1 2 1   Restless 1 1 1  0  Easily annoyed or irritable 0 0 2 0  Afraid - awful might happen 0 0 0 0  Total GAD 7 Score 3 2 9 2   Anxiety Difficulty Not difficult at all Not difficult at all Not difficult at all Not difficult at all   ABDOMINAL PAIN  Recent episodes of abdominal pain and diarrhea.  Seen on 10/13/22 for this with recommendations to start fiber and  get back into GI for assessment.  Celiac testing was negative and recent labs overall reassuring.  She is scheduled to see GI in July.  Continues to have bloating and cramping -- anytime she eats anything she feels very nauseous and has to go to bathroom.  Diarrhea has improved, but she has not been eating as much.  Takes Zofran as needed for nausea, tries not to take often as causes migraines.  At times feels like something is twisting in left side, she is concerned IUD is causing issues.  Has had IUD for over one year. Duration:weeks Onset: gradual Severity: 6/10 Quality: sharp, dull, aching, and throbbing Location:  diffuse  Episode duration: all day if presents Radiation: no Frequency: intermittent Alleviating factors: nothing Aggravating factors: eating/drinking Status: fluctuating Treatments attempted: Fiber (not fully helped), IBGuard Fever: no Nausea: yes Vomiting: yes Weight loss: yes Decreased appetite: yes Diarrhea: yes Constipation: yes Blood in stool: no Heartburn: no -- tight in chest at times Jaundice: no Rash: no Dysuria/urinary frequency: no Hematuria: no History of sexually transmitted disease: no Recurrent NSAID use: no   Relevant past medical, surgical, family and social history reviewed and updated as indicated. Interim medical history since our last visit reviewed. Allergies and medications reviewed and updated.  Review of Systems  Constitutional:  Negative for activity change, appetite change, diaphoresis, fatigue and fever.  Respiratory:  Negative for cough, chest tightness and shortness of breath.   Cardiovascular:  Negative for chest pain, palpitations and leg swelling.  Gastrointestinal:  Positive for abdominal distention, abdominal pain, constipation, diarrhea, nausea and vomiting. Negative for anal bleeding, blood in stool and rectal pain.  Skin:  Negative for rash.  Neurological: Negative.   Psychiatric/Behavioral:  Positive for sleep  disturbance. Negative for decreased concentration, self-injury and suicidal ideas. The patient is nervous/anxious.     Per HPI unless specifically indicated above     Objective:    There were no vitals taken for this visit.  Wt Readings from Last 3 Encounters:  09/13/22 135 lb 8 oz (61.5 kg)  05/02/22 148 lb 8 oz (67.4 kg)  11/23/21 157 lb 6.4 oz (71.4 kg)    Physical Exam Vitals and nursing note reviewed.  Constitutional:      General: She is awake. She is not in acute distress.    Appearance: She is well-developed and well-groomed. She is not ill-appearing or toxic-appearing.  HENT:     Head: Normocephalic.     Right Ear: Hearing normal.     Left Ear: Hearing normal.  Eyes:     General: Lids are normal.  Right eye: No discharge.        Left eye: No discharge.     Conjunctiva/sclera: Conjunctivae normal.  Pulmonary:     Effort: Pulmonary effort is normal. No accessory muscle usage or respiratory distress.  Musculoskeletal:     Cervical back: Normal range of motion.  Neurological:     Mental Status: She is alert and oriented to person, place, and time.  Psychiatric:        Attention and Perception: Attention normal.        Mood and Affect: Mood normal.        Behavior: Behavior normal. Behavior is cooperative.        Thought Content: Thought content normal.        Judgment: Judgment normal.    Results for orders placed or performed in visit on 10/16/22  Fecal occult blood, imunochemical([facility])   Specimen: Stool   ST  Result Value Ref Range   Fecal Occult Bld Negative Negative  Reticulin Antibody, IgA w reflex titer  Result Value Ref Range   Reticulin Ab, IgA Negative Neg:<1:2.5 titer  Tissue transglutaminase, IgA  Result Value Ref Range   Transglutaminase IgA <2 0 - 3 U/mL  Gliadin antibodies, serum  Result Value Ref Range   Antigliadin Abs, IgA 4 0 - 19 units   Gliadin IgG 2 0 - 19 units  TSH  Result Value Ref Range   TSH 1.390 0.450 -  4.500 uIU/mL  Comprehensive metabolic panel  Result Value Ref Range   Glucose 85 70 - 99 mg/dL   BUN 10 6 - 20 mg/dL   Creatinine, Ser 8.11 0.57 - 1.00 mg/dL   eGFR 914 >78 GN/FAO/1.30   BUN/Creatinine Ratio 14 9 - 23   Sodium 138 134 - 144 mmol/L   Potassium 4.2 3.5 - 5.2 mmol/L   Chloride 104 96 - 106 mmol/L   CO2 22 20 - 29 mmol/L   Calcium 9.7 8.7 - 10.2 mg/dL   Total Protein 6.5 6.0 - 8.5 g/dL   Albumin 4.7 4.0 - 5.0 g/dL   Globulin, Total 1.8 1.5 - 4.5 g/dL   Albumin/Globulin Ratio 2.6 (H) 1.2 - 2.2   Bilirubin Total 0.7 0.0 - 1.2 mg/dL   Alkaline Phosphatase 56 44 - 121 IU/L   AST 12 0 - 40 IU/L   ALT 8 0 - 32 IU/L  CBC with Differential/Platelet  Result Value Ref Range   WBC 9.7 3.4 - 10.8 x10E3/uL   RBC 4.83 3.77 - 5.28 x10E6/uL   Hemoglobin 14.6 11.1 - 15.9 g/dL   Hematocrit 86.5 78.4 - 46.6 %   MCV 90 79 - 97 fL   MCH 30.2 26.6 - 33.0 pg   MCHC 33.6 31.5 - 35.7 g/dL   RDW 69.6 29.5 - 28.4 %   Platelets 396 150 - 450 x10E3/uL   Neutrophils 62 Not Estab. %   Lymphs 29 Not Estab. %   Monocytes 6 Not Estab. %   Eos 2 Not Estab. %   Basos 1 Not Estab. %   Neutrophils Absolute 6.1 1.4 - 7.0 x10E3/uL   Lymphocytes Absolute 2.8 0.7 - 3.1 x10E3/uL   Monocytes Absolute 0.5 0.1 - 0.9 x10E3/uL   EOS (ABSOLUTE) 0.2 0.0 - 0.4 x10E3/uL   Basophils Absolute 0.1 0.0 - 0.2 x10E3/uL   Immature Granulocytes 0 Not Estab. %   Immature Grans (Abs) 0.0 0.0 - 0.1 x10E3/uL      Assessment & Plan:   Problem List Items Addressed  This Visit       Nervous and Auditory   Multiple sclerosis (HCC) - Primary    Chronic, ongoing.  Continue to collaborate with neurology team, recent notes reviewed.          Other   Depression    Chronic, ongoing.  Denies SI/HI.  We will trial a direct switch from Sertraline 50 MG to Duloxetine starting at 30 MG daily for one week and then increasing to 60 MG daily if tolerating, educated her on this change and medication.  This may benefit mood and  pins/needles skin + abdominal pain (?fibromyalgia).  Continue Xanax as ordered by neurology.  Educated on BLACK BOX warning.  Return to office in 4 weeks.  May benefit from therapy time in future.      Relevant Medications   DULoxetine (CYMBALTA) 30 MG capsule   Generalized abdominal pain    Ongoing, no improvement -- recommend we obtain imaging via CT scan since ongoing and recent labs reassuring.  She is agreeable to this.  Is scheduled to see GI.  Continue Zofran as needed.  Determine next steps after imaging returns.  ?Duloxetine may offer benefit if more functional in nature.      Relevant Orders   CT Abdomen Pelvis Wo Contrast   Psychophysiological insomnia    Chronic, ongoing issue.  Has been followed by neurology on this and utilized multiple medications.  Currently taking Xanax as needed.  Recommend therapy, CBT, to work on sleep issues and mental health.   Discussed trial of Olly Brand Sleep gummies at night to help with sleep, as she would prefer a more natural approach vs adding on more medications.  Continue to collaborate with neurology.       I discussed the assessment and treatment plan with the patient. The patient was provided an opportunity to ask questions and all were answered. The patient agreed with the plan and demonstrated an understanding of the instructions.   The patient was advised to call back or seek an in-person evaluation if the symptoms worsen or if the condition fails to improve as anticipated.   I provided 21+ minutes of time during this encounter.    Follow up plan: Return in about 4 weeks (around 11/29/2022) for MOOD AND ABDOMINAL PAIN -- added on Duloxetine and CT scan done.

## 2022-11-01 NOTE — Assessment & Plan Note (Signed)
Chronic, ongoing.  Denies SI/HI.  We will trial a direct switch from Sertraline 50 MG to Duloxetine starting at 30 MG daily for one week and then increasing to 60 MG daily if tolerating, educated her on this change and medication.  This may benefit mood and pins/needles skin + abdominal pain (?fibromyalgia).  Continue Xanax as ordered by neurology.  Educated on BLACK BOX warning.  Return to office in 4 weeks.  May benefit from therapy time in future.

## 2022-11-01 NOTE — Progress Notes (Signed)
Arlyss Repress, MD [address]  [address]  [place], [place] [address]  Main: [phone]  Fax: [phone]    Gastroenterology Consultation  Referring Provider:     Marjie Skiff, NP Primary Care Physician:  Marjie Skiff, NP Primary Gastroenterologist:  Dr. [name] Reason for Consultation: Generalized abdominal pain, alternating constipation and diarrhea, nausea        HPI:   Jennifer Watts is a 27 y.o. female referred by Marjie Skiff, NP  for consultation & management of chronic symptoms of generalized abdominal pain and worse in left upper quadrant associated with alternating episodes of constipation and diarrhea associated with nausea.  Patient admits to vaping.  Her FOBT was negative.  TTG IgA normal, TSH normal, no evidence of anemia.  CRP negative.  She does have history of MS.  Patient is accompanied by her boyfriend.  Patient does acknowledge to eating junk food, highly processed foods, fried foods, red meat.  She does not eat any fruits and vegetables.  Previously, she was experiencing constipation for which she took Linzess 290 mcg which led to diarrhea.  She is no longer on Linzess.  She is scheduled to undergo CT abdomen pelvis without contrast on 11/06/2022 ordered by her PCP.   NSAIDs: None  Antiplts/Anticoagulants/Anti thrombotics: None  GI Procedures: None  Past Medical History:  Diagnosis Date   Anxiety    Depression    Migraines    MS (multiple sclerosis) (HCC)    Neuromuscular disorder (HCC)     Past Surgical History:  Procedure Laterality Date   TYMPANOSTOMY TUBE PLACEMENT       Current Outpatient Medications:    ALPRAZolam (XANAX) 0.5 MG tablet, Take 0.5 mg by mouth at bedtime as needed for anxiety., Disp: , Rfl:    cholecalciferol (VITAMIN D3) 25 MCG (1000 UT) tablet, Take 1,000 Units by mouth daily., Disp: , Rfl:    clobetasol cream (TEMOVATE) 0.05 %, APPLY TO AFFECTED AREA TWICE A DAY, Disp: 30 g, Rfl: 4    Cyanocobalamin (B-12 PO), Take 1 tablet by mouth daily., Disp: , Rfl:    hydrOXYzine (VISTARIL) 25 MG capsule, Take 1 capsule (25 mg total) by mouth 3 (three) times daily as needed., Disp: 30 capsule, Rfl: 0   linaclotide (LINZESS) 290 MCG CAPS capsule, Take 1 capsule (290 mcg total) by mouth daily., Disp: 90 capsule, Rfl: 4   omeprazole (PRILOSEC) 20 MG capsule, Take 1 capsule (20 mg total) by mouth daily., Disp: 90 capsule, Rfl: 4   ondansetron (ZOFRAN) 4 MG tablet, Take 1 tablet (4 mg total) by mouth every 8 (eight) hours as needed for nausea or vomiting., Disp: 20 tablet, Rfl: 0   SUMAtriptan (IMITREX) 100 MG tablet, Take 50-100 mg by mouth daily as needed., Disp: , Rfl:    ondansetron (ZOFRAN-ODT) 4 MG disintegrating tablet, Take 1 tablet (4 mg total) by mouth every 8 (eight) hours as needed for nausea or vomiting. (Patient not taking: Reported on 11/01/2022), Disp: 20 tablet, Rfl: 0 No current facility-administered medications for this visit.  Facility-Administered Medications Ordered in Other Visits:    acetaminophen (TYLENOL) tablet 1,000 mg, 1,000 mg, Oral, Once, Orlie Dakin, Tollie Pizza, MD   Family History  Problem Relation Age of Onset   Hypertension Maternal Grandmother    Cancer Paternal Grandfather        throat   Multiple sclerosis Paternal Grandfather    Anxiety disorder Mother      Social History  Tobacco Use   Smoking status: Former    Packs/day: .25    Types: Cigarettes    Quit date: 01/09/2020    Years since quitting: 2.8   Smokeless tobacco: Never  Vaping Use   Vaping Use: Former  Substance Use Topics   Alcohol use: Yes    Comment: social   Drug use: No    Allergies as of 11/01/2022 - Review Complete 11/01/2022  Allergen Reaction Noted   Reglan [metoclopramide] Other (See Comments) 03/28/2017   Ibuprofen Rash 09/23/2014   Penicillins Rash 09/23/2014   Sulfa antibiotics Rash 09/23/2014   Sulfasalazine Rash 09/23/2014    Review of Systems:    All systems  reviewed and negative except where noted in HPI.   Physical Exam:  BP 128/89 (BP Location: Left Arm, Patient Position: Sitting, Cuff Size: Normal)   Pulse (!) 101   Temp 98.9 F (37.2 C) (Oral)   Ht 5' 4.49" (1.638 m)   Wt 126 lb 8 oz (57.4 kg)   BMI 21.38 kg/m  No LMP recorded. (Menstrual status: IUD).  General:   Alert,  Well-developed, well-nourished, pleasant and cooperative in NAD Head:  Normocephalic and atraumatic. Eyes:  Sclera clear, no icterus.   Conjunctiva pink. Ears:  Normal auditory acuity. Nose:  No deformity, discharge, or lesions. Mouth:  No deformity or lesions,oropharynx pink & moist. Neck:  Supple; no masses or thyromegaly. Lungs:  Respirations even and unlabored.  Clear throughout to auscultation.   No wheezes, crackles, or rhonchi. No acute distress. Heart:  Regular rate and rhythm; no murmurs, clicks, rubs, or gallops. Abdomen:  Normal bowel sounds. Soft, non-tender and non-distended without masses, hepatosplenomegaly or hernias noted.  No guarding or rebound tenderness.   Rectal: Not performed Msk:  Symmetrical without gross deformities. Good, equal movement & strength bilaterally. Pulses:  Normal pulses noted. Extremities:  No clubbing or edema.  No cyanosis. Neurologic:  Alert and oriented x3;  grossly normal neurologically. Skin:  Intact without significant lesions or rashes. No jaundice. Psych:  Alert and cooperative. Normal mood and affect.  Imaging Studies: Reviewed  Assessment and Plan:   Jennifer Watts is a 27 y.o. female with history of multiple sclerosis is seen in consultation for chronic symptoms of abdominal cramps associated with bloating, feeling gassy, nausea, alternating episodes of diarrhea and constipation with poor eating habits since her childhood.  Does drink carbonated.  Celiac panel negative beverages  Recommend H. pylori breath test Trial of Linzess 72 mcg daily Reiterated to the patient regarding elimination of highly  processed foods, carbonated beverages, sugary foods/drinks.  She says she will make an effort to make changes in her diet   Follow up in 4 months   Arlyss Repress, MD

## 2022-11-01 NOTE — Assessment & Plan Note (Signed)
Ongoing, no improvement -- recommend we obtain imaging via CT scan since ongoing and recent labs reassuring.  She is agreeable to this.  Is scheduled to see GI.  Continue Zofran as needed.  Determine next steps after imaging returns.  ?Duloxetine may offer benefit if more functional in nature.

## 2022-11-06 ENCOUNTER — Ambulatory Visit: Admission: RE | Admit: 2022-11-06 | Payer: Medicaid Other | Source: Ambulatory Visit

## 2022-11-08 ENCOUNTER — Encounter: Payer: Self-pay | Admitting: Nurse Practitioner

## 2022-11-23 ENCOUNTER — Ambulatory Visit: Payer: Medicaid Other | Admitting: Physician Assistant

## 2022-11-24 ENCOUNTER — Other Ambulatory Visit: Payer: Self-pay | Admitting: Family Medicine

## 2022-11-24 NOTE — Telephone Encounter (Signed)
Prescription request Paxlovid. Called pt to assess sx. Pt stated that she didn't need it and not sure why the prescription request was done. She stated she is not sick and does not have Covid.   Med not assigned to a protocol. Refusing med. Routing refusal to office.  Last RF 05/30/22 #30  end date: 06/04/22   Requested Prescriptions  Pending Prescriptions Disp Refills   PAXLOVID, 300/100, 20 x 150 MG & 10 x 100MG  TBPK [Pharmacy Med Name: PAXLOVID 150(X2)-100MG  PK(EUA)] 30 each     Sig: TAKE 3 TABLETS BY MOUTH 2 (TWO) TIMES DAILY FOR 5 DAYS. (TAKE NIRMATRELVIR 150 MG TWO TABLETS TWICE DAILY FOR 5 DAYS AND RITONAVIR 100 MG ONE TABLET TWICE DAILY FOR 5 DAYS)     Off-Protocol Failed - 11/24/2022  9:51 AM      Failed - Medication not assigned to a protocol, review manually.      Passed - Valid encounter within last 12 months    Recent Outpatient Visits           3 weeks ago Multiple sclerosis (HCC)   [facility] [place], [place] T, NP   1 month ago Functional diarrhea   [place] [facility] [place], Megan P, DO   2 months ago Bacterial vaginosis   [facility] [place], [place] T, NP   2 months ago Mild episode of recurrent major depressive disorder (HCC)   [place] [facility] [place], Jolene T, NP   4 months ago Mild episode of recurrent major depressive disorder (HCC)   [place] [facility] [place], Dorie Rank, NP       Future Appointments             In 5 days [place], Dorie Rank, NP [facility]   In 3 months Vanga, Loel Dubonnet, MD [facility] [place] [facility]

## 2022-11-28 ENCOUNTER — Encounter: Payer: Self-pay | Admitting: Nurse Practitioner

## 2022-11-29 ENCOUNTER — Ambulatory Visit: Payer: MEDICAID | Admitting: Nurse Practitioner

## 2022-12-10 NOTE — Patient Instructions (Signed)
Migraine Headache A migraine headache is a very strong throbbing pain on one or both sides of your head. This type of headache can also cause other symptoms. It can last from 4 hours to 3 days. Talk with your doctor about what things may bring on (trigger) this condition. What are the causes? The exact cause of a migraine is not known. This condition may be brought on or caused by: Smoking. Medicines, such as: Medicine used to treat chest pain (nitroglycerin). Birth control pills. Estrogen. Some blood pressure medicines. Certain substances in some foods or drinks. Foods and drinks, such as: Cheese. Chocolate. Alcohol. Caffeine. Doing physical activity that is very hard. Other things that may trigger a migraine headache include: Periods. Pregnancy. Hunger. Stress. Getting too much or too little sleep. Weather changes. Feeling tired (fatigue). What increases the risk? Being 25-55 years old. Being female. Having a family history of migraine headaches. Being Caucasian. Having a mental health condition, such as being sad (depressed) or feeling worried or nervous (anxious). Being very overweight (obese). What are the signs or symptoms? A throbbing pain. This pain may: Happen in any area of the head, such as on one or both sides. Make it hard to do daily activities. Get worse with physical activity. Get worse around bright lights, loud noises, or smells. Other symptoms may include: Feeling like you may vomit (nauseous). Vomiting. Dizziness. Before a migraine headache starts, you may get warning signs (an aura). An aura may include: Seeing flashing lights or having blind spots. Seeing bright spots, halos, or zigzag lines. Having tunnel vision or blurred vision. Having numbness or a tingling feeling. Having trouble talking. Having weak muscles. After a migraine ends, you may have symptoms. These may include: Tiredness. Trouble thinking (concentrating). How is this  treated? Taking medicines that: Relieve pain. Relieve the feeling like you may vomit. Prevent migraine headaches. Treatment may also include: Acupuncture. Lifestyle changes like avoiding foods that bring on migraine headaches. Learning ways to control your body functions (biofeedback). Therapy to help you know and deal with negative thoughts (cognitive behavioral therapy). Follow these instructions at home: Medicines Take over-the-counter and prescription medicines only as told by your doctor. If told, take steps to prevent problems with pooping (constipation). You may need to: Drink enough fluid to keep your pee (urine) pale yellow. Take medicines. You will be told what medicines to take. Eat foods that are high in fiber. These include beans, whole grains, and fresh fruits and vegetables. Limit foods that are high in fat and sugar. These include fried or sweet foods. Ask your doctor if you should avoid driving or using machines while you are taking your medicine. Lifestyle  Do not drink alcohol. Do not smoke or use any products that contain nicotine or tobacco. If you need help quitting, ask your doctor. Get 7-9 hours of sleep each night, or the amount recommended by your doctor. Find ways to deal with stress, such as meditation, deep breathing, or yoga. Try to exercise often. This can help lessen how bad and how often your migraines happen. General instructions Keep a journal to find out what may bring on your migraine headaches. This can help you avoid those things. For example, write down: What you eat and drink. How much sleep you get. Any change to your medicines or diet. If you have a migraine headache: Avoid things that make your symptoms worse, such as bright lights. Lie down in a dark, quiet room. Do not drive or use machinery. Ask your   doctor what activities are safe for you. Where to find more information Coalition for Headache and Migraine Patients (CHAMP):  headachemigraine.org American Migraine Foundation: americanmigrainefoundation.org National Headache Foundation: headaches.org Contact a doctor if: You get a migraine headache that is different or worse than others you have had. You have more than 15 days of headaches in one month. Get help right away if: Your migraine headache gets very bad. Your migraine headache lasts more than 72 hours. You have a fever or stiff neck. You have trouble seeing. Your muscles feel weak or like you cannot control them. You lose your balance a lot. You have trouble walking. You faint. You have a seizure. This information is not intended to replace advice given to you by your health care provider. Make sure you discuss any questions you have with your health care provider. Document Revised: 12/26/2021 Document Reviewed: 12/26/2021 Elsevier Patient Education  2024 Elsevier Inc.  

## 2022-12-11 ENCOUNTER — Encounter: Payer: Self-pay | Admitting: Nurse Practitioner

## 2022-12-11 ENCOUNTER — Ambulatory Visit (INDEPENDENT_AMBULATORY_CARE_PROVIDER_SITE_OTHER): Payer: MEDICAID | Admitting: Nurse Practitioner

## 2022-12-11 VITALS — BP 125/84 | HR 98 | Temp 98.8°F | Ht 64.49 in | Wt 127.6 lb

## 2022-12-11 DIAGNOSIS — F330 Major depressive disorder, recurrent, mild: Secondary | ICD-10-CM | POA: Diagnosis not present

## 2022-12-11 DIAGNOSIS — R209 Unspecified disturbances of skin sensation: Secondary | ICD-10-CM | POA: Diagnosis not present

## 2022-12-11 DIAGNOSIS — R1084 Generalized abdominal pain: Secondary | ICD-10-CM | POA: Diagnosis not present

## 2022-12-11 DIAGNOSIS — F419 Anxiety disorder, unspecified: Secondary | ICD-10-CM

## 2022-12-11 MED ORDER — DULOXETINE HCL 60 MG PO CPEP: 60.0000 mg | ORAL_CAPSULE | Freq: Every day | ORAL | 4 refills | Status: DC

## 2022-12-11 NOTE — Assessment & Plan Note (Signed)
Chronic, ongoing.  Denies SI/HI.  Will increase Duloxetine to 60 MG daily as is seeing benefit from this.  Offering benefit to mood and pins/needles skin + abdominal pain (?fibromyalgia).  Continue Xanax as ordered by neurology.  Educated on BLACK BOX warning.  Return to office in 8 weeks.  May benefit from therapy time in future.

## 2022-12-11 NOTE — Progress Notes (Signed)
BP 125/84   Pulse 98   Temp 98.8 F (37.1 C) (Oral)   Ht 5' 4.49" (1.638 m)   Wt 127 lb 9.6 oz (57.9 kg)   SpO2 98%   BMI 21.57 kg/m    Subjective:    Patient ID: Jennifer Watts, female    DOB: 04/13/1996, 27 y.o.   MRN: [id number]  HPI: Jennifer Watts is a 27 y.o. female  Chief Complaint  Patient presents with   Mood   Abdominal Pain    Patient still having some pain, did not have CT scan.    Concern about legs, occasionally getting color change/splotchy appearance when stands up and some discomfort with being on legs for longer period.  Legs are cold all the time.  This is becoming worse.  Has underlying MS and she reports neurology told her it was circulation issues.  She requests to see vascular.  DEPRESSION Had been on Zoloft but this stopped in past months. Taking Vistaril as needed.  Taking Duloxetine 30 MG started past visit, which is offering benefit to both skin and mood. Current medications Xanax 0.5 MG PRN at bedtime prescribed by neurology -- she continues to take this.  Recently got IUD out and switched birth control.    Saw GI provider on 11/01/22 and was started on Linzess at lower dose.  Missed CT appointment.  Reports still some pain to both sides abdomen, but not 100%.  Notices pain more when moving, sharp/short pain.  No further reduction in appetite or nausea.  Mood status: stable Satisfied with current treatment?: yes Symptom severity: moderate  Duration of current treatment : chronic Side effects: no Medication compliance: good compliance Psychotherapy/counseling: none Previous psychiatric medications: Zoloft -- multiple different in past Depressed mood: no Anxious mood: no Anhedonia: no Significant weight loss or gain: no Insomnia: yes hard to fall asleep Fatigue: yes Feelings of worthlessness or guilt: no Impaired concentration/indecisiveness: no Suicidal ideations: no Hopelessness: no Crying spells: no    12/11/2022    1:59 PM 11/01/2022    10:48 AM 08/30/2022    4:10 PM 07/19/2022    2:10 PM 05/02/2022   11:11 AM  Depression screen PHQ 2/9  Decreased Interest 0 1 1 2 2   Down, Depressed, Hopeless 0 0 0 1 0  PHQ - 2 Score 0 1 1 3 2   Altered sleeping 2 0 3 3 2   Tired, decreased energy 2 1 1 3 2   Change in appetite 0 1 0 0 0  Feeling bad or failure about yourself  0 0 0 1 0  Trouble concentrating 0 0 1 1 1   Moving slowly or fidgety/restless 0 1 0 0 0  Suicidal thoughts 0 0 0 0 0  PHQ-9 Score 4 4 6 11 7   Difficult doing work/chores Not difficult at all Not difficult at all Not difficult at all Not difficult at all Not difficult at all       12/11/2022    2:00 PM 11/01/2022   10:50 AM 08/30/2022    4:12 PM 07/19/2022    2:11 PM  GAD 7 : Generalized Anxiety Score  Nervous, Anxious, on Edge 1 1 0 2  Control/stop worrying 0 0 0 1  Worry too much - different things 0 0 0 1  Trouble relaxing 1 1 1 2   Restless 0 1 1 1   Easily annoyed or irritable 0 0 0 2  Afraid - awful might happen 0 0 0 0  Total GAD 7  Score 2 3 2 9   Anxiety Difficulty Not difficult at all Not difficult at all Not difficult at all Not difficult at all   Relevant past medical, surgical, family and social history reviewed and updated as indicated. Interim medical history since our last visit reviewed. Allergies and medications reviewed and updated.  Review of Systems  Constitutional:  Negative for activity change, appetite change, diaphoresis, fatigue and fever.  Respiratory:  Negative for cough, chest tightness and shortness of breath.   Cardiovascular:  Negative for chest pain, palpitations and leg swelling.  Gastrointestinal:  Positive for abdominal distention and abdominal pain. Negative for anal bleeding, blood in stool, constipation, diarrhea, nausea, rectal pain and vomiting.  Skin:  Negative for rash.  Neurological: Negative.   Psychiatric/Behavioral:  Positive for sleep disturbance. Negative for decreased concentration, self-injury and suicidal  ideas. The patient is not nervous/anxious.     Per HPI unless specifically indicated above     Objective:    BP 125/84   Pulse 98   Temp 98.8 F (37.1 C) (Oral)   Ht 5' 4.49" (1.638 m)   Wt 127 lb 9.6 oz (57.9 kg)   SpO2 98%   BMI 21.57 kg/m   Wt Readings from Last 3 Encounters:  12/11/22 127 lb 9.6 oz (57.9 kg)  11/01/22 126 lb 8 oz (57.4 kg)  09/13/22 135 lb 8 oz (61.5 kg)    Physical Exam Vitals and nursing note reviewed.  Constitutional:      General: She is awake. She is not in acute distress.    Appearance: She is well-developed and well-groomed. She is not ill-appearing or toxic-appearing.  HENT:     Head: Normocephalic.     Right Ear: Hearing and external ear normal.     Left Ear: Hearing and external ear normal.  Eyes:     General: Lids are normal.        Right eye: No discharge.        Left eye: No discharge.     Conjunctiva/sclera: Conjunctivae normal.     Pupils: Pupils are equal, round, and reactive to light.  Neck:     Thyroid: No thyromegaly.     Vascular: No carotid bruit.  Cardiovascular:     Rate and Rhythm: Normal rate and regular rhythm.     Pulses:          Dorsalis pedis pulses are 1+ on the right side and 1+ on the left side.       Posterior tibial pulses are 1+ on the right side and 1+ on the left side.     Heart sounds: Normal heart sounds. No murmur heard.    No gallop.  Pulmonary:     Effort: Pulmonary effort is normal.     Breath sounds: Normal breath sounds.  Abdominal:     General: Bowel sounds are normal. There is no distension.     Palpations: Abdomen is soft.     Tenderness: There is no abdominal tenderness. There is no right CVA tenderness or left CVA tenderness.  Musculoskeletal:     Cervical back: Normal range of motion and neck supple.     Right lower leg: No edema.     Left lower leg: No edema.     Right foot: Normal range of motion.     Left foot: Normal range of motion.  Feet:     Right foot:     Skin integrity:  Skin integrity normal.     Left foot:  Skin integrity: Skin integrity normal.     Comments: Bilateral lower extremities cold to touch Lymphadenopathy:     Cervical: No cervical adenopathy.  Skin:    General: Skin is warm and dry.  Neurological:     Mental Status: She is alert and oriented to person, place, and time.  Psychiatric:        Attention and Perception: Attention normal.        Mood and Affect: Mood normal.        Speech: Speech normal.        Behavior: Behavior normal. Behavior is cooperative.        Thought Content: Thought content normal.     Results for orders placed or performed in visit on 11/08/22  HM PAP SMEAR  Result Value Ref Range   HM Pap smear see report       Assessment & Plan:   Problem List Items Addressed This Visit       Other   Anxiety    Chronic, stable. Refer to depression plan of care.      Relevant Medications   DULoxetine (CYMBALTA) 60 MG capsule   Depression - Primary    Chronic, ongoing.  Denies SI/HI.  Will increase Duloxetine to 60 MG daily as is seeing benefit from this.  Offering benefit to mood and pins/needles skin + abdominal pain (?fibromyalgia).  Continue Xanax as ordered by neurology.  Educated on BLACK BOX warning.  Return to office in 8 weeks.  May benefit from therapy time in future.      Relevant Medications   DULoxetine (CYMBALTA) 60 MG capsule   Generalized abdominal pain    Ongoing, mild improvement with Linzess but not a lot -- recommend we obtain imaging via CT scan since ongoing and recent labs reassuring.  She is agreeable to this.  Continue to collaborate with GI, appreciate their input.  Continue Zofran as needed.  Determine next steps after imaging returns.  ?related to MS and more muscular discomfort.      Relevant Orders   CT ABDOMEN PELVIS WO CONTRAST   Other Visit Diagnoses     Sensation of cold in leg       Referral to vascular per patient request.   Relevant Orders   Ambulatory referral to  Vascular Surgery        Follow up plan: Return in about 2 months (around 02/11/2023) for MOOD AND ABD PAIN.

## 2022-12-11 NOTE — Assessment & Plan Note (Signed)
Ongoing, mild improvement with Linzess but not a lot -- recommend we obtain imaging via CT scan since ongoing and recent labs reassuring.  She is agreeable to this.  Continue to collaborate with GI, appreciate their input.  Continue Zofran as needed.  Determine next steps after imaging returns.  ?related to MS and more muscular discomfort.

## 2022-12-11 NOTE — Assessment & Plan Note (Signed)
Chronic, stable. Refer to depression plan of care.

## 2022-12-26 ENCOUNTER — Ambulatory Visit
Admission: RE | Admit: 2022-12-26 | Discharge: 2022-12-26 | Disposition: A | Discharge status: Home / Self Care | Payer: MEDICAID | Source: Ambulatory Visit | Attending: Nurse Practitioner | Admitting: Nurse Practitioner

## 2022-12-26 DIAGNOSIS — R1084 Generalized abdominal pain: Secondary | ICD-10-CM | POA: Diagnosis not present

## 2022-12-30 NOTE — Progress Notes (Signed)
Contacted via MyChart   Good evening Jennifer Watts, your imaging has returned and does show a couple areas that would be beneficial for GI to further assess.  Around your belly button there is a slight area of defect noted to the wall -- ?whether this is a hernia.  There is also mild inflammation of some lymph nodes, but this may be reactive due to you not feeling well.  Definitely recommend follow-up with GI for further recommendations on this.  Any questions?

## 2023-01-02 ENCOUNTER — Telehealth: Payer: Self-pay | Admitting: Gastroenterology

## 2023-01-02 NOTE — Telephone Encounter (Signed)
Patient mother Marchelle Folks) she wants to know if her daughter needs to see her earlier. The mother said per [facility] patient needs to be seen for her result. Her mother would like a call back on what she need to do.

## 2023-01-03 NOTE — Telephone Encounter (Signed)
Can one of you please review her chart and let me know what I could do for the patient. Who does she need to see (Dr. Allegra Lai or Dr. Tobi Bastos)? Patient's mom does not want her daughter to be seen by [place]. However, patient already has a follow up appointment with Dr. Allegra Lai.

## 2023-01-04 NOTE — Telephone Encounter (Signed)
Jennifer Watts  I see that she has an appointment to see Dr. Tobi Bastos on 10/7  RV

## 2023-01-10 NOTE — Telephone Encounter (Signed)
Called patient and left her a voicemail to call us back. I will also send her a MyChart message.

## 2023-02-05 ENCOUNTER — Encounter: Payer: Self-pay | Admitting: Nurse Practitioner

## 2023-02-12 ENCOUNTER — Ambulatory Visit: Payer: MEDICAID | Admitting: Nurse Practitioner

## 2023-02-12 ENCOUNTER — Telehealth: Payer: MEDICAID | Admitting: Nurse Practitioner

## 2023-02-15 ENCOUNTER — Encounter: Payer: Self-pay | Admitting: Nurse Practitioner

## 2023-02-16 MED ORDER — DULOXETINE HCL 60 MG PO CPEP: 60.0000 mg | ORAL_CAPSULE | Freq: Every day | ORAL | 4 refills | Status: AC

## 2023-02-19 ENCOUNTER — Other Ambulatory Visit: Payer: Self-pay

## 2023-02-19 ENCOUNTER — Encounter: Payer: Self-pay | Admitting: Gastroenterology

## 2023-02-19 ENCOUNTER — Ambulatory Visit (INDEPENDENT_AMBULATORY_CARE_PROVIDER_SITE_OTHER): Payer: MEDICAID | Admitting: Gastroenterology

## 2023-02-19 VITALS — BP 140/96 | HR 71 | Temp 98.7°F | Wt 125.0 lb

## 2023-02-19 DIAGNOSIS — R1031 Right lower quadrant pain: Secondary | ICD-10-CM | POA: Diagnosis not present

## 2023-02-19 DIAGNOSIS — R935 Abnormal findings on diagnostic imaging of other abdominal regions, including retroperitoneum: Secondary | ICD-10-CM | POA: Diagnosis not present

## 2023-02-19 DIAGNOSIS — R634 Abnormal weight loss: Secondary | ICD-10-CM

## 2023-02-19 DIAGNOSIS — G8929 Other chronic pain: Secondary | ICD-10-CM

## 2023-02-19 MED ORDER — NA SULFATE-K SULFATE-MG SULF 17.5-3.13-1.6 GM/177ML PO SOLN
354.0000 mL | Freq: Once | ORAL | 0 refills | Status: AC
Start: 2023-02-19 — End: 2023-02-19

## 2023-02-19 NOTE — Progress Notes (Signed)
Jennifer Mood MD, MRCP(U.K) [address]  [address]  [place], [place] [address]  Main: [phone]  Fax: [phone]   Primary Care Physician: Marjie Skiff, NP  Primary Gastroenterologist:  Dr. Wyline Watts   Chief Complaint  Patient presents with   dyspepsia    HPI: Jennifer Watts is a 27 y.o. female   Summary of history :  She has a history of MS and has been followed for IBS-C ,Previously, she was experiencing constipation for which she took Linzess 290 mcg which led to diarrhea. She is no longer on Linzess . Celiac negative.    Interval history   11/01/2022-02/19/2023  Commenced on linzess 72 mcg at last visit.   12/30/2022: CT abdomen Nonspecific mildly prominent right lower quadrant mesenteric nodes.Very small periumbilical abdominal wall defect containing omental fat.  10/16/2022: CBC,CMP- normal  She says that she is having persistent discomfort in the right lower quadrant of her abdomen worse after her meals no diarrhea does have on and off constipation but doing well with the Linzess.  Denies any NSAID use no family history of inflammatory bowel disease.  Lost some weight.  Denies any fever.  Current Outpatient Medications  Medication Sig Dispense Refill   ALPRAZolam (XANAX) 0.5 MG tablet Take 0.5 mg by mouth at bedtime as needed for anxiety.     cholecalciferol (VITAMIN D3) 25 MCG (1000 UT) tablet Take 1,000 Units by mouth daily.     clobetasol cream (TEMOVATE) 0.05 % APPLY TO AFFECTED AREA TWICE A DAY 30 g 4   Cyanocobalamin (B-12 PO) Take 1 tablet by mouth daily.     DULoxetine (CYMBALTA) 60 MG capsule Take 1 capsule (60 mg total) by mouth daily. 90 capsule 4   hydrOXYzine (VISTARIL) 25 MG capsule Take 1 capsule (25 mg total) by mouth 3 (three) times daily as needed. 30 capsule 0   linaclotide (LINZESS) 290 MCG CAPS capsule Take 1 capsule (290 mcg total) by mouth daily. 90 capsule 4   norethindrone (MICRONOR) 0.35 MG tablet Take 1 tablet by mouth  daily.     omeprazole (PRILOSEC) 20 MG capsule Take 1 capsule (20 mg total) by mouth daily. 90 capsule 4   ondansetron (ZOFRAN) 4 MG tablet Take 1 tablet (4 mg total) by mouth every 8 (eight) hours as needed for nausea or vomiting. 20 tablet 0   SUMAtriptan (IMITREX) 100 MG tablet Take 50-100 mg by mouth daily as needed.     No current facility-administered medications for this visit.   Facility-Administered Medications Ordered in Other Visits  Medication Dose Route Frequency Provider Last Rate Last Admin   acetaminophen (TYLENOL) tablet 1,000 mg  1,000 mg Oral Once Jeralyn Ruths, MD        Allergies as of 02/19/2023 - Review Complete 02/19/2023  Allergen Reaction Noted   Reglan [metoclopramide] Other (See Comments) 03/28/2017   Ibuprofen Rash 09/23/2014   Penicillins Rash 09/23/2014   Sulfa antibiotics Rash 09/23/2014   Sulfasalazine Rash 09/23/2014      ROS:  General: Negative for anorexia, weight loss, fever, chills, fatigue, weakness. ENT: Negative for hoarseness, difficulty swallowing , nasal congestion. CV: Negative for chest pain, angina, palpitations, dyspnea on exertion, peripheral edema.  Respiratory: Negative for dyspnea at rest, dyspnea on exertion, cough, sputum, wheezing.  GI: See history of present illness. GU:  Negative for dysuria, hematuria, urinary incontinence, urinary frequency, nocturnal urination.  Endo: Negative for unusual weight change.    Physical Examination:   BP Marland Kitchen)  140/96   Pulse 71   Temp 98.7 F (37.1 C) (Oral)   Wt 125 lb (56.7 kg)   BMI 21.13 kg/m   General: Well-nourished, well-developed in no acute distress.  Eyes: No icterus. Conjunctivae pink. Mouth: Oropharyngeal mucosa moist and pink , no lesions erythema or exudate. Abdomen: Bowel sounds are normal, mild right lower quadrant tenderness nondistended, no hepatosplenomegaly or masses, no abdominal bruits or hernia , no rebound or guarding.   Extremities: No lower extremity  edema. No clubbing or deformities. Neuro: Alert and oriented x 3.  Grossly intact. Skin: Warm and dry, no jaundice.   Psych: Alert and cooperative, normal Watts and affect.   Imaging Studies: No results found.  Assessment and Plan:   Jennifer Watts is a 27 y.o. 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  CT scan of the abdomen and in 01/02/2023 showed nonspecific borderline mildly prominent right lower quadrant mesenteric nodes labs otherwise were normal.  On examination she has some mild right lower quadrant tenderness.  Some loss of weight symptoms are nonspecific but in view of loss of weight I would want to rule out Crohn's disease  Plan 1.  Repeat CT scan of the abdomen to evaluate for abnormal lymph nodes seen previously since she is still symptomatic and having weight loss. 2.  After her CT scan we will proceed with EGD and colonoscopy to evaluate for abdominal pain and rule out Crohn's disease.   I have discussed alternative options, risks & benefits,  which include, but are not limited to, bleeding, infection, perforation,respiratory complication & drug reaction.  The patient agrees with this plan & written consent will be obtained.     Dr Jennifer Mood  MD,MRCP [facility]) Follow up in 8-12 weeks

## 2023-02-19 NOTE — Patient Instructions (Signed)
Please arrive at 3: 00 [facility] since you need to take contrast for your CT Scan. If you can not make it then, please call [phone] to reschedule.

## 2023-02-19 NOTE — Addendum Note (Signed)
Addended by: Adela Ports on: 02/19/2023 02:49 PM   Modules accepted: Orders

## 2023-02-22 ENCOUNTER — Ambulatory Visit
Admission: RE | Admit: 2023-02-22 | Discharge: 2023-02-22 | Disposition: A | Discharge status: Home / Self Care | Payer: MEDICAID | Source: Ambulatory Visit | Attending: Gastroenterology | Admitting: Gastroenterology

## 2023-02-22 DIAGNOSIS — R1031 Right lower quadrant pain: Secondary | ICD-10-CM | POA: Diagnosis present

## 2023-02-22 DIAGNOSIS — G8929 Other chronic pain: Secondary | ICD-10-CM | POA: Insufficient documentation

## 2023-02-22 DIAGNOSIS — R634 Abnormal weight loss: Secondary | ICD-10-CM | POA: Diagnosis present

## 2023-02-28 ENCOUNTER — Encounter: Payer: Self-pay | Admitting: Gastroenterology

## 2023-03-01 ENCOUNTER — Ambulatory Visit: Payer: MEDICAID | Admitting: Registered Nurse

## 2023-03-01 ENCOUNTER — Encounter
Admission: RE | Disposition: A | Discharge status: Home / Self Care | Payer: Self-pay | Source: Home / Self Care | Attending: Gastroenterology

## 2023-03-01 ENCOUNTER — Other Ambulatory Visit: Payer: Self-pay

## 2023-03-01 ENCOUNTER — Encounter: Payer: Self-pay | Admitting: Gastroenterology

## 2023-03-01 ENCOUNTER — Ambulatory Visit
Admission: RE | Admit: 2023-03-01 | Discharge: 2023-03-01 | Disposition: A | Discharge status: Home / Self Care | Payer: MEDICAID | Attending: Gastroenterology | Admitting: Gastroenterology

## 2023-03-01 DIAGNOSIS — R1013 Epigastric pain: Secondary | ICD-10-CM | POA: Diagnosis present

## 2023-03-01 DIAGNOSIS — R634 Abnormal weight loss: Secondary | ICD-10-CM

## 2023-03-01 DIAGNOSIS — R109 Unspecified abdominal pain: Secondary | ICD-10-CM

## 2023-03-01 DIAGNOSIS — Z87891 Personal history of nicotine dependence: Secondary | ICD-10-CM | POA: Insufficient documentation

## 2023-03-01 DIAGNOSIS — G8929 Other chronic pain: Secondary | ICD-10-CM

## 2023-03-01 DIAGNOSIS — F419 Anxiety disorder, unspecified: Secondary | ICD-10-CM | POA: Insufficient documentation

## 2023-03-01 HISTORY — PX: COLONOSCOPY WITH PROPOFOL: SHX5780

## 2023-03-01 HISTORY — PX: ESOPHAGOGASTRODUODENOSCOPY (EGD) WITH PROPOFOL: SHX5813

## 2023-03-01 SURGERY — COLONOSCOPY WITH PROPOFOL
Anesthesia: General

## 2023-03-01 MED ORDER — PROPOFOL 10 MG/ML IV BOLUS
INTRAVENOUS | Status: DC | PRN
  Administered 2023-03-01: 100 mg via INTRAVENOUS

## 2023-03-01 MED ORDER — SODIUM CHLORIDE 0.9 % IV SOLN: INTRAVENOUS | Status: DC

## 2023-03-01 MED ORDER — LIDOCAINE HCL (CARDIAC) PF 100 MG/5ML IV SOSY
PREFILLED_SYRINGE | INTRAVENOUS | Status: DC | PRN
  Administered 2023-03-01: 40 mg via INTRAVENOUS

## 2023-03-01 MED ORDER — PROPOFOL 500 MG/50ML IV EMUL
INTRAVENOUS | Status: DC | PRN
  Administered 2023-03-01: 200 ug/kg/min via INTRAVENOUS

## 2023-03-01 NOTE — Op Note (Signed)
[facility] Gastroenterology Patient Name: Jennifer Watts Procedure Date: 03/01/2023 10:01 AM MRN: [id number] Account #: [id number] Date of Birth: 11/19/1995 Admit Type: Outpatient Age: 27 Room: [facility] ENDO ROOM 3 Gender: Female Note Status: Finalized Instrument Name: Upper Endoscope [id number] Procedure:             Upper GI endoscopy Indications:           Dyspepsia Providers:             [name] MD, MD Referring MD:          Dorie Rank. Harvest Dark (Referring MD) Medicines:             Monitored Anesthesia Care Complications:         No immediate complications. Procedure:             Pre-Anesthesia Assessment:                        - Prior to the procedure, a History and Physical was                         performed, and patient medications, allergies and                         sensitivities were reviewed. The patient's tolerance                         of previous anesthesia was reviewed.                        - The risks and benefits of the procedure and the                         sedation options and risks were discussed with the                         patient. All questions were answered and informed                         consent was obtained.                        - ASA Grade Assessment: II - A patient with mild                         systemic disease.                        After obtaining informed consent, the endoscope was                         passed under direct vision. Throughout the procedure,                         the patient's blood pressure, pulse, and oxygen                         saturations were monitored continuously. The Endoscope                         was introduced through  the mouth, and advanced to the                         third part of duodenum. The upper GI endoscopy was                         accomplished with ease. The patient tolerated the                         procedure well. Findings:      The esophagus was  normal.      The examined duodenum was normal.      The entire examined stomach was normal. Biopsies were taken with a cold       forceps for histology.      The cardia and gastric fundus were normal on retroflexion. Impression:            - Normal esophagus.                        - Normal examined duodenum.                        - Normal stomach. Biopsied. Recommendation:        - Await pathology results.                        - Perform a colonoscopy today. Procedure Code(s):     --- Professional ---                        [phone], Esophagogastroduodenoscopy, flexible,                         transoral; with biopsy, single or multiple Diagnosis Code(s):     --- Professional ---                        R10.13, Epigastric pain CPT copyright 2022 American Medical Association. All rights reserved. The codes documented in this report are preliminary and upon coder review may  be revised to meet current compliance requirements. [name], MD [name] MD, MD 03/01/2023 10:15:32 AM This report has been signed electronically. Number of Addenda: 0 Note Initiated On: 03/01/2023 10:01 AM Estimated Blood Loss:  Estimated blood loss: none.      [facility]

## 2023-03-01 NOTE — H&P (Signed)
[name], MD [address], [place], [place], [address] [address], [place], [place], [address] Phone: [phone]  Fax: [phone]  Primary Care Physician:  Marjie Skiff, NP   Pre-Procedure History & Physical: HPI:  Jennifer Watts is a 27 y.o. female is here for an endoscopy and colonoscopy    Past Medical History:  Diagnosis Date   Anxiety    Depression    Migraines    MS (multiple sclerosis) (HCC)    Neuromuscular disorder (HCC)     Past Surgical History:  Procedure Laterality Date   TYMPANOSTOMY TUBE PLACEMENT      Prior to Admission medications   Medication Sig Start Date End Date Taking? Authorizing Provider  ALPRAZolam Prudy Feeler) 0.5 MG tablet Take 0.5 mg by mouth at bedtime as needed for anxiety.   Yes [provider]  cholecalciferol (VITAMIN D3) 25 MCG (1000 UT) tablet Take 1,000 Units by mouth daily.   Yes [provider]  Cyanocobalamin (B-12 PO) Take 1 tablet by mouth daily.   Yes [provider]  DULoxetine (CYMBALTA) 60 MG capsule Take 1 capsule (60 mg total) by mouth daily. 02/16/23  Yes Cannady, Jolene T, NP  hydrOXYzine (VISTARIL) 25 MG capsule Take 1 capsule (25 mg total) by mouth 3 (three) times daily as needed. 09/18/22  Yes Cannady, Corrie Dandy T, NP  linaclotide (LINZESS) 290 MCG CAPS capsule Take 1 capsule (290 mcg total) by mouth daily. 05/02/22  Yes Cannady, Jolene T, NP  norethindrone (MICRONOR) 0.35 MG tablet Take 1 tablet by mouth daily. 11/02/22 11/02/23 Yes [provider]  omeprazole (PRILOSEC) 20 MG capsule Take 1 capsule (20 mg total) by mouth daily. 05/02/22  Yes Cannady, Jolene T, NP  ondansetron (ZOFRAN) 4 MG tablet Take 1 tablet (4 mg total) by mouth every 8 (eight) hours as needed for nausea or vomiting. 05/03/22  Yes Cannady, Jolene T, NP  SUMAtriptan (IMITREX) 100 MG tablet Take 50-100 mg by mouth daily as needed. 01/08/17  Yes [provider]  clobetasol cream  (TEMOVATE) 0.05 % APPLY TO AFFECTED AREA TWICE A DAY 09/19/21   Aura Dials T, NP    Allergies as of 02/19/2023 - Review Complete 02/19/2023  Allergen Reaction Noted   Reglan [metoclopramide] Other (See Comments) 03/28/2017   Ibuprofen Rash 09/23/2014   Penicillins Rash 09/23/2014   Sulfa antibiotics Rash 09/23/2014   Sulfasalazine Rash 09/23/2014    Family History  Problem Relation Age of Onset   Hypertension Maternal Grandmother    Cancer Paternal Grandfather        throat   Multiple sclerosis Paternal Grandfather    Anxiety disorder Mother     Social History   Socioeconomic History   Marital status: Single    Spouse name: Not on file   Number of children: Not on file   Years of education: Not on file   Highest education level: Not on file  Occupational History   Not on file  Tobacco Use   Smoking status: Former    Current packs/day: 0.00    Types: Cigarettes    Quit date: 01/09/2020    Years since quitting: 3.1   Smokeless tobacco: Never  Vaping Use   Vaping status: Former  Substance and Sexual Activity   Alcohol use: Yes    Comment: social   Drug use: No   Sexual activity: Yes    Birth control/protection: Injection  Other Topics Concern   Not on file  Social History Narrative   Not on file   Social Determinants of Health   Financial Resource Strain: Not on file  Food Insecurity: Not on file  Transportation Needs: Not on file  Physical Activity: Not on file  Stress: Not on file  Social Connections: Not on file  Intimate Partner Violence: Not on file    Review of Systems: See HPI, otherwise negative ROS  Physical Exam: BP (!) 147/108   Pulse (!) 126   Temp (!) 97 F (36.1 C) (Temporal)   Wt 54.4 kg   SpO2 98%   BMI 20.29 kg/m  General:   Alert,  pleasant and cooperative in NAD Head:  Normocephalic and atraumatic. Neck:  Supple; no masses or thyromegaly. Lungs:  Clear throughout to auscultation, normal respiratory effort.    Heart:  +S1,  +S2, Regular rate and rhythm, No edema. Abdomen:  Soft, nontender and nondistended. Normal bowel sounds, without guarding, and without rebound.   Neurologic:  Alert and  oriented x4;  grossly normal neurologically.  Impression/Plan: Jennifer Watts is here for an endoscopy and colonoscopy  to be performed for  evaluation of abdominal pain     Risks, benefits, limitations, and alternatives regarding endoscopy have been reviewed with the patient.  Questions have been answered.  All parties agreeable.   [name], MD  03/01/2023, 10:02 AM

## 2023-03-01 NOTE — Op Note (Signed)
[facility] Gastroenterology Patient Name: Jennifer Watts Procedure Date: 03/01/2023 10:01 AM MRN: [id number] Account #: [id number] Date of Birth: August 09, 1995 Admit Type: Outpatient Age: 27 Room: [facility] ENDO ROOM 3 Gender: Female Note Status: Finalized Instrument Name: Peds Colonoscope [id number] Procedure:             Colonoscopy Indications:           Abdominal pain Providers:             [name] MD, MD Referring MD:          Dorie Rank. Harvest Dark (Referring MD) Medicines:             Monitored Anesthesia Care Complications:         No immediate complications. Procedure:             Pre-Anesthesia Assessment:                        - Prior to the procedure, a History and Physical was                         performed, and patient medications, allergies and                         sensitivities were reviewed. The patient's tolerance                         of previous anesthesia was reviewed.                        - The risks and benefits of the procedure and the                         sedation options and risks were discussed with the                         patient. All questions were answered and informed                         consent was obtained.                        - ASA Grade Assessment: II - A patient with mild                         systemic disease.                        After obtaining informed consent, the colonoscope was                         passed under direct vision. Throughout the procedure,                         the patient's blood pressure, pulse, and oxygen                         saturations were monitored continuously. The                         Colonoscope was introduced through the  anus and                         advanced to the the cecum, identified by the                         appendiceal orifice. The colonoscopy was performed                         with ease. The patient tolerated the procedure well.                          The quality of the bowel preparation was excellent.                         The terminal ileum, ileocecal valve, appendiceal                         orifice, and rectum were photographed. Findings:      The terminal ileum appeared normal. Biopsies were taken with a cold       forceps for histology.      The exam was otherwise without abnormality on direct and retroflexion       views. Impression:            - The examined portion of the ileum was normal.                         Biopsied.                        - The examination was otherwise normal on direct and                         retroflexion views. Recommendation:        - Discharge patient to home (with escort).                        - Resume previous diet.                        - Continue present medications.                        - Await pathology results.                        - Return to GI office as previously scheduled. Procedure Code(s):     --- Professional ---                        [phone], Colonoscopy, flexible; with biopsy, single or                         multiple Diagnosis Code(s):     --- Professional ---                        R10.9, Unspecified abdominal pain CPT copyright 2022 American Medical Association. All rights reserved. The codes documented in this report are preliminary and upon coder review may  be revised to meet current compliance requirements. [name], MD  [name] MD, MD 03/01/2023 10:28:12 AM This report has been signed electronically. Number of Addenda: 0 Note Initiated On: 03/01/2023 10:01 AM Scope Withdrawal Time: 0 hours 6 minutes 57 seconds  Total Procedure Duration: 0 hours 9 minutes 54 seconds  Estimated Blood Loss:  Estimated blood loss: none.      [facility]

## 2023-03-01 NOTE — Anesthesia Preprocedure Evaluation (Signed)
Anesthesia Evaluation  Patient identified by MRN, date of birth, ID band Patient awake    Reviewed: Allergy & Precautions, NPO status , Patient's Chart, lab work & pertinent test results  Airway Mallampati: III  TM Distance: >3 FB Neck ROM: Full    Dental  (+) Teeth Intact   Pulmonary neg pulmonary ROS, Patient abstained from smoking., former smoker   Pulmonary exam normal breath sounds clear to auscultation       Cardiovascular negative cardio ROS Normal cardiovascular exam Rhythm:Regular Rate:Normal     Neuro/Psych  Headaches  Anxiety     negative neurological ROS  negative psych ROS   GI/Hepatic negative GI ROS, Neg liver ROS,,,  Endo/Other  negative endocrine ROS    Renal/GU negative Renal ROS  negative genitourinary   Musculoskeletal   Abdominal Normal abdominal exam  (+)   Peds negative pediatric ROS (+)  Hematology negative hematology ROS (+)   Anesthesia Other Findings Past Medical History: No date: Anxiety No date: Depression No date: Migraines No date: MS (multiple sclerosis) (HCC) No date: Neuromuscular disorder (HCC)  Past Surgical History: No date: TYMPANOSTOMY TUBE PLACEMENT  BMI    Body Mass Index: 20.29 kg/m      Reproductive/Obstetrics negative OB ROS                              Anesthesia Physical Anesthesia Plan  ASA: 2  Anesthesia Plan: General   Post-op Pain Management:    Induction: Intravenous  PONV Risk Score and Plan: Propofol infusion and TIVA  Airway Management Planned: Natural Airway  Additional Equipment:   Intra-op Plan:   Post-operative Plan:   Informed Consent: I have reviewed the patients History and Physical, chart, labs and discussed the procedure including the risks, benefits and alternatives for the proposed anesthesia with the patient or authorized representative who has indicated his/her understanding and acceptance.      Dental Advisory Given  Plan Discussed with: CRNA and Surgeon  Anesthesia Plan Comments:          Anesthesia Quick Evaluation

## 2023-03-01 NOTE — Anesthesia Postprocedure Evaluation (Signed)
Anesthesia Post Note  Patient: Jennifer Watts  Procedure(s) Performed: COLONOSCOPY WITH PROPOFOL ESOPHAGOGASTRODUODENOSCOPY (EGD) WITH PROPOFOL  Patient location during evaluation: PACU Anesthesia Type: General Level of consciousness: awake and awake and alert Pain management: satisfactory to patient Vital Signs Assessment: post-procedure vital signs reviewed and stable Respiratory status: spontaneous breathing Cardiovascular status: blood pressure returned to baseline Anesthetic complications: no   No notable events documented.   Last Vitals:  Vitals:   03/01/23 1040 03/01/23 1045  BP: (!) 133/104 (!) 139/99  Pulse: 99 90  Resp: 13 18  Temp:    SpO2: 100% 100%    Last Pain:  Vitals:   03/01/23 1045  TempSrc:   PainSc: 0-No pain                 VAN STAVEREN,Keni Wafer

## 2023-03-01 NOTE — Transfer of Care (Signed)
Immediate Anesthesia Transfer of Care Note  Patient: Jennifer Watts  Procedure(s) Performed: Procedure(s): COLONOSCOPY WITH PROPOFOL (N/A) ESOPHAGOGASTRODUODENOSCOPY (EGD) WITH PROPOFOL (N/A)  Patient Location: PACU and Endoscopy Unit  Anesthesia Type:General  Level of Consciousness: sedated  Airway & Oxygen Therapy: Patient Spontanous Breathing and Patient connected to nasal cannula oxygen  Post-op Assessment: Report given to RN and Post -op Vital signs reviewed and stable  Post vital signs: Reviewed and stable  Last Vitals:  Vitals:   03/01/23 0949 03/01/23 1030  BP: (!) 147/108 (!) 122/90  Pulse: (!) 126 100  Resp:  (!) 26  Temp: (!) 36.1 C (!) 36.1 C  SpO2: 98% 99%    Complications: No apparent anesthesia complications

## 2023-03-02 ENCOUNTER — Encounter: Payer: Self-pay | Admitting: Gastroenterology

## 2023-03-02 LAB — SURGICAL PATHOLOGY

## 2023-03-04 NOTE — Plan of Care (Signed)
[facility] Tonsillectomy/Adenoidectomy, Postoperative PEDS care plan entered in error.

## 2023-03-05 ENCOUNTER — Ambulatory Visit: Payer: Medicaid Other | Admitting: Gastroenterology

## 2023-03-06 ENCOUNTER — Other Ambulatory Visit (INDEPENDENT_AMBULATORY_CARE_PROVIDER_SITE_OTHER): Payer: Self-pay | Admitting: Nurse Practitioner

## 2023-03-06 DIAGNOSIS — R209 Unspecified disturbances of skin sensation: Secondary | ICD-10-CM

## 2023-03-07 ENCOUNTER — Encounter (INDEPENDENT_AMBULATORY_CARE_PROVIDER_SITE_OTHER): Payer: Self-pay

## 2023-03-07 ENCOUNTER — Encounter (INDEPENDENT_AMBULATORY_CARE_PROVIDER_SITE_OTHER): Payer: Self-pay | Admitting: Nurse Practitioner

## 2023-03-28 NOTE — Progress Notes (Deleted)
Jennifer Amy, PA-C [address]  [address]  [place], [place] [address]  Main: [phone]  Fax: [phone]   Primary Care Physician: Jennifer Skiff, NP  Primary Gastroenterologist:  Jennifer Amy, PA-C / Dr. Wyline Watts    CC: Follow-up chronic RLQ pain, IBS-C  HPI: Jennifer Watts is a 27 y.o. female, with history of MS, presents for follow-up of chronic RLQ pain.  History of IBS-C.  Previously saw Dr. Tobi Watts.  Abdominal pelvic CT 01/02/2023 showed nonspecific borderline mildly prominent RLQ mesenteric lymph nodes, otherwise normal.  Repeat CT scan abdomen pelvis without contrast 03/05/2023: RLQ nonspecific mildly prominent nodes likely to be reactive.  EGD 03/01/2023 by Dr. Tobi Watts was normal.  Biopsies negative for H. pylori.  Colonoscopy 03/01/2023: Normal.  Terminal ileal biopsies negative for Crohn's.  Current symptoms and treatment  Current Outpatient Medications  Medication Sig Dispense Refill   ALPRAZolam (XANAX) 0.5 MG tablet Take 0.5 mg by mouth at bedtime as needed for anxiety.     cholecalciferol (VITAMIN D3) 25 MCG (1000 UT) tablet Take 1,000 Units by mouth daily.     clobetasol cream (TEMOVATE) 0.05 % APPLY TO AFFECTED AREA TWICE A DAY 30 g 4   Cyanocobalamin (B-12 PO) Take 1 tablet by mouth daily.     DULoxetine (CYMBALTA) 60 MG capsule Take 1 capsule (60 mg total) by mouth daily. 90 capsule 4   hydrOXYzine (VISTARIL) 25 MG capsule Take 1 capsule (25 mg total) by mouth 3 (three) times daily as needed. 30 capsule 0   linaclotide (LINZESS) 290 MCG CAPS capsule Take 1 capsule (290 mcg total) by mouth daily. 90 capsule 4   norethindrone (MICRONOR) 0.35 MG tablet Take 1 tablet by mouth daily.     omeprazole (PRILOSEC) 20 MG capsule Take 1 capsule (20 mg total) by mouth daily. 90 capsule 4   ondansetron (ZOFRAN) 4 MG tablet Take 1 tablet (4 mg total) by mouth every 8 (eight) hours as needed for nausea or vomiting. 20 tablet 0   SUMAtriptan (IMITREX) 100 MG  tablet Take 50-100 mg by mouth daily as needed.     No current facility-administered medications for this visit.   Facility-Administered Medications Ordered in Other Visits  Medication Dose Route Frequency Provider Last Rate Last Admin   acetaminophen (TYLENOL) tablet 1,000 mg  1,000 mg Oral Once Jennifer Ruths, MD        Allergies as of 03/29/2023 - Review Complete 03/01/2023  Allergen Reaction Noted   Reglan [metoclopramide] Other (See Comments) 03/28/2017   Ibuprofen Rash 09/23/2014   Penicillins Rash 09/23/2014   Sulfa antibiotics Rash 09/23/2014   Sulfasalazine Rash 09/23/2014    Past Medical History:  Diagnosis Date   Anxiety    Depression    Migraines    MS (multiple sclerosis) (HCC)    Neuromuscular disorder (HCC)     Past Surgical History:  Procedure Laterality Date   COLONOSCOPY WITH PROPOFOL N/A 03/01/2023   Procedure: COLONOSCOPY WITH PROPOFOL;  Surgeon: Jennifer Mood, MD;  Location: [facility] ENDOSCOPY;  Service: Gastroenterology;  Laterality: N/A;   ESOPHAGOGASTRODUODENOSCOPY (EGD) WITH PROPOFOL N/A 03/01/2023   Procedure: ESOPHAGOGASTRODUODENOSCOPY (EGD) WITH PROPOFOL;  Surgeon: Jennifer Mood, MD;  [facility] END[facility]COPY;  Service: Gastroenterology;  Laterality: N/A;   TYMPANOSTOMY TUBE PLACEMENT      Review of Systems:    All systems reviewed and negative except where noted in HPI.   Physical Examination:   There were no vitals taken for this visit.  General:  Well-nourished, well-developed in no acute distress.  Lungs: Clear to auscultation bilaterally. Non-labored. Heart: Regular rate and rhythm, no murmurs rubs or gallops.  Abdomen: Bowel sounds are normal; Abdomen is Soft; No hepatosplenomegaly, masses or hernias;  No Abdominal Tenderness; No guarding or rebound tenderness. Neuro: Alert and oriented x 3.  Grossly intact.  Psych: Alert and cooperative, normal Watts and affect.   Imaging Studies: No results found.  Assessment and Plan:   Jennifer Watts is a 27 y.o. y/o female ***    Jennifer Amy, PA-C  Follow up ***  BP check ***

## 2023-03-29 ENCOUNTER — Ambulatory Visit: Payer: MEDICAID | Admitting: Physician Assistant

## 2023-04-10 ENCOUNTER — Ambulatory Visit: Payer: MEDICAID | Admitting: Gastroenterology

## 2023-04-16 ENCOUNTER — Encounter (INDEPENDENT_AMBULATORY_CARE_PROVIDER_SITE_OTHER): Payer: Self-pay

## 2023-04-16 ENCOUNTER — Encounter: Payer: Self-pay | Admitting: Nurse Practitioner

## 2023-04-16 ENCOUNTER — Encounter (INDEPENDENT_AMBULATORY_CARE_PROVIDER_SITE_OTHER): Payer: Self-pay | Admitting: Nurse Practitioner

## 2023-04-17 NOTE — Telephone Encounter (Signed)
Attempted to reach patient, LVM to call office back to schedule appointment in person per provider.  Put in CRM.

## 2023-05-04 ENCOUNTER — Encounter (INDEPENDENT_AMBULATORY_CARE_PROVIDER_SITE_OTHER): Payer: Self-pay | Admitting: Nurse Practitioner

## 2023-05-04 ENCOUNTER — Other Ambulatory Visit: Payer: Self-pay | Admitting: Neurology

## 2023-05-04 DIAGNOSIS — G35 Multiple sclerosis: Secondary | ICD-10-CM

## 2023-05-06 ENCOUNTER — Other Ambulatory Visit: Payer: Self-pay | Admitting: Nurse Practitioner

## 2023-05-08 NOTE — Telephone Encounter (Signed)
Requested Prescriptions  Pending Prescriptions Disp Refills   omeprazole (PRILOSEC) 20 MG capsule [Pharmacy Med Name: OMEPRAZOLE DR 20 MG CAPSULE] 90 capsule 0    Sig: TAKE 1 CAPSULE BY MOUTH EVERY DAY     Gastroenterology: Proton Pump Inhibitors Passed - 05/08/2023  8:46 AM      Passed - Valid encounter within last 12 months    Recent Outpatient Visits           4 months ago Mild episode of recurrent major depressive disorder (HCC)   [place] [facility] [place], Corrie Dandy T, NP   6 months ago Multiple sclerosis (HCC)   [place] [facility] [place], Corrie Dandy T, NP   6 months ago Functional diarrhea   [place] [facility] [place], Megan P, DO   7 months ago Bacterial vaginosis   [place] [facility] [place], [place] T, NP   8 months ago Mild episode of recurrent major depressive disorder [facility])    [facility] [place], Dorie Rank, NP

## 2023-05-15 ENCOUNTER — Other Ambulatory Visit: Payer: Self-pay | Admitting: Nurse Practitioner

## 2023-05-19 NOTE — Telephone Encounter (Signed)
 Rx discontinued 11/01/22 due to change in dose, refill not appropriate. Requested Prescriptions  Pending Prescriptions Disp Refills   DULoxetine  (CYMBALTA ) 30 MG capsule [Pharmacy Med Name: DULOXETINE  HCL DR 30 MG CAP] 60 capsule 4    Sig: START OUT TAKING 30 MG (1 CAPSULE) BY MOUTH DAILY AND THEN IF TOLERATING IN ONE WEEK INCREASE TO 60 MG (2 CAPSULES) BY MOUTH DAILY.     Psychiatry: Antidepressants - SNRI - duloxetine  Failed - 05/19/2023 11:23 AM      Failed - Last BP in normal range    BP Readings from Last 1 Encounters:  03/01/23 (!) 139/99         Passed - Cr in normal range and within 360 days    Creatinine  Date Value Ref Range Status  05/09/2014 0.72 0.60 - 1.30 mg/dL Final   Creatinine, Ser  Date Value Ref Range Status  10/16/2022 0.74 0.57 - 1.00 mg/dL Final         Passed - eGFR is 30 or above and within 360 days    GFR calc Af Amer  Date Value Ref Range Status  01/03/2019 136 >59 mL/min/1.73 Final   GFR, Estimated  Date Value Ref Range Status  04/26/2020 >60 >60 mL/min Final    Comment:    (NOTE) Calculated using the CKD-EPI Creatinine Equation (2021)    eGFR  Date Value Ref Range Status  10/16/2022 115 >59 mL/min/1.73 Final         Passed - Completed PHQ-2 or PHQ-9 in the last 360 days      Passed - Valid encounter within last 6 months    Recent Outpatient Visits           5 months ago Mild episode of recurrent major depressive disorder (HCC)   [place] [facility] [place], Melanie T, NP   6 months ago Multiple sclerosis (HCC)   [place] [facility] [place], Melanie T, NP   7 months ago Functional diarrhea   [place] [facility] [place], Megan P, DO   8 months ago Bacterial vaginosis   [place] [facility] [place], [place] T, NP   8 months ago Mild episode of recurrent major depressive disorder [facility])    [facility] [place], Melanie DASEN, NP

## 2023-05-29 ENCOUNTER — Encounter: Payer: Self-pay | Admitting: Nurse Practitioner

## 2023-05-31 ENCOUNTER — Telehealth: Payer: MEDICAID

## 2023-05-31 DIAGNOSIS — L03012 Cellulitis of left finger: Secondary | ICD-10-CM | POA: Diagnosis not present

## 2023-05-31 MED ORDER — CEPHALEXIN 500 MG PO CAPS: 500.0000 mg | ORAL_CAPSULE | Freq: Two times a day (BID) | ORAL | 0 refills | Status: AC

## 2023-05-31 NOTE — Progress Notes (Signed)
Virtual Visit Consent   Jennifer Watts, you are scheduled for a virtual visit with a [facility] provider today. Just as with appointments in the office, your consent must be obtained to participate. Your consent will be active for this visit and any virtual visit you may have with one of our providers in the next 365 days. If you have a MyChart account, a copy of this consent can be sent to you electronically.  As this is a virtual visit, video technology does not allow for your provider to perform a traditional examination. This may limit your provider's ability to fully assess your condition. If your provider identifies any concerns that need to be evaluated in person or the need to arrange testing (such as labs, EKG, etc.), we will make arrangements to do so. Although advances in technology are sophisticated, we cannot ensure that it will always work on either your end or our end. If the connection with a video visit is poor, the visit may have to be switched to a telephone visit. With either a video or telephone visit, we are not always able to ensure that we have a secure connection.  By engaging in this virtual visit, you consent to the provision of healthcare and authorize for your insurance to be billed (if applicable) for the services provided during this visit. Depending on your insurance coverage, you may receive a charge related to this service.  I need to obtain your verbal consent now. Are you willing to proceed with your visit today? Jennifer Watts has provided verbal consent on 05/31/2023 for a virtual visit (video or telephone). Jennifer Watts, [place]  Date: 05/31/2023 4:01 PM  Virtual Visit via Video Note   I, Jennifer Watts, connected with  Jennifer Watts  ([id number], [birth year]) on 05/31/23 at  4:00 PM EST by a video-enabled telemedicine application and verified that I am speaking with the correct person using two identifiers.  Location: Patient: Virtual Visit  Location Patient: Home Provider: Virtual Visit Location Provider: Home Office   I discussed the limitations of evaluation and management by telemedicine and the availability of in person appointments. The patient expressed understanding and agreed to proceed.    History of Present Illness: Jennifer Watts is a 28 y.o. who identifies as a female who was assigned female at birth, and is being seen today for 2 days of pain and swelling around the nail bed of the left fourth finger. Notes swelling, redness and tenderness.  Denies trauma or injury. Denies fever, chills.    HPI: HPI  Problems:  Patient Active Problem List   Diagnosis Date Noted   Chronic RLQ pain 03/01/2023   Generalized abdominal pain 11/01/2022   Psychophysiological insomnia 11/23/2021   Vitamin B12 deficiency 04/13/2021   Eczema 07/11/2019   Heart burn 01/27/2019   Migraine without aura and without status migrainosus, not intractable 03/14/2017   Nicotine dependence, cigarettes, uncomplicated 07/08/2015   Multiple sclerosis (HCC) 10/26/2014   Depression 02/27/2014   Anxiety 01/20/2014   Vitamin D deficiency 12/29/2013    Allergies:  Allergies  Allergen Reactions   Reglan [Metoclopramide] Other (See Comments)    Hyperactivity /becomes mean   Ibuprofen Rash   Penicillins Rash    Has patient had a PCN reaction causing immediate rash, facial/tongue/throat swelling, SOB or lightheadedness with hypotension: No Has patient had a PCN reaction causing severe rash involving mucus membranes or skin necrosis: No Has patient had a PCN reaction that required hospitalization: No  Has patient had a PCN reaction occurring within the last 10 years: No If all of the above answers are "NO", then may proceed with Cephalosporin use.    Sulfa Antibiotics Rash   Sulfasalazine Rash   Medications:  Current Outpatient Medications:    cephALEXin (KEFLEX) 500 MG capsule, Take 1 capsule (500 mg total) by mouth 2 (two) times daily for 7  days., Disp: 14 capsule, Rfl: 0   ALPRAZolam (XANAX) 0.5 MG tablet, Take 0.5 mg by mouth at bedtime as needed for anxiety., Disp: , Rfl:    cholecalciferol (VITAMIN D3) 25 MCG (1000 UT) tablet, Take 1,000 Units by mouth daily., Disp: , Rfl:    clobetasol cream (TEMOVATE) 0.05 %, APPLY TO AFFECTED AREA TWICE A DAY, Disp: 30 g, Rfl: 4   Cyanocobalamin (B-12 PO), Take 1 tablet by mouth daily., Disp: , Rfl:    DULoxetine (CYMBALTA) 60 MG capsule, Take 1 capsule (60 mg total) by mouth daily., Disp: 90 capsule, Rfl: 4   linaclotide (LINZESS) 290 MCG CAPS capsule, Take 1 capsule (290 mcg total) by mouth daily., Disp: 90 capsule, Rfl: 4   omeprazole (PRILOSEC) 20 MG capsule, TAKE 1 CAPSULE BY MOUTH EVERY DAY, Disp: 90 capsule, Rfl: 0   ondansetron (ZOFRAN) 4 MG tablet, Take 1 tablet (4 mg total) by mouth every 8 (eight) hours as needed for nausea or vomiting., Disp: 20 tablet, Rfl: 0   SUMAtriptan (IMITREX) 100 MG tablet, Take 50-100 mg by mouth daily as needed., Disp: , Rfl:  No current facility-administered medications for this visit.  Facility-Administered Medications Ordered in Other Visits:    acetaminophen (TYLENOL) tablet 1,000 mg, 1,000 mg, Oral, Once, Finnegan, Tollie Pizza, MD  Observations/Objective: Patient is well-developed, well-nourished in no acute distress.  Resting comfortably at home.  Head is normocephalic, atraumatic.  No labored breathing. Speech is clear and coherent with logical content.  Patient is alert and oriented at baseline.  Left fourth distal phalanx with erythema and swelling. No nail trauma or injury noted.   Assessment and Plan: 1. Cellulitis of finger of left hand (Primary) - cephALEXin (KEFLEX) 500 MG capsule; Take 1 capsule (500 mg total) by mouth 2 (two) times daily for 7 days.  Dispense: 14 capsule; Refill: 0  Atraumatic. Afebrile. Supportive measures and OTC medications reviewed. Keflex per orders. In person evaluation for any non-resolving, new or worsening  symptoms.  Follow Up Instructions: I discussed the assessment and treatment plan with the patient. The patient was provided an opportunity to ask questions and all were answered. The patient agreed with the plan and demonstrated an understanding of the instructions.  A copy of instructions were sent to the patient via MyChart unless otherwise noted below.   The patient was advised to call back or seek an in-person evaluation if the symptoms worsen or if the condition fails to improve as anticipated.    Jennifer Climes, PA-C

## 2023-05-31 NOTE — Patient Instructions (Signed)
  Zareth L Kines, thank you for joining Piedad Climes, PA-C for today's virtual visit.  While this provider is not your primary care provider (PCP), if your PCP is located in our provider database this encounter information will be shared with them immediately following your visit.   A [facility] MyChart account gives you access to today's visit and all your visits, tests, and labs performed at [facility] " click here if you don't have a [facility] MyChart account or go to [email or web address]  Consent: (Patient) Jennifer Watts provided verbal consent for this virtual visit at the beginning of the encounter.  Current Medications:  Current Outpatient Medications:    ALPRAZolam (XANAX) 0.5 MG tablet, Take 0.5 mg by mouth at bedtime as needed for anxiety., Disp: , Rfl:    cholecalciferol (VITAMIN D3) 25 MCG (1000 UT) tablet, Take 1,000 Units by mouth daily., Disp: , Rfl:    clobetasol cream (TEMOVATE) 0.05 %, APPLY TO AFFECTED AREA TWICE A DAY, Disp: 30 g, Rfl: 4   Cyanocobalamin (B-12 PO), Take 1 tablet by mouth daily., Disp: , Rfl:    DULoxetine (CYMBALTA) 60 MG capsule, Take 1 capsule (60 mg total) by mouth daily., Disp: 90 capsule, Rfl: 4   hydrOXYzine (VISTARIL) 25 MG capsule, Take 1 capsule (25 mg total) by mouth 3 (three) times daily as needed., Disp: 30 capsule, Rfl: 0   linaclotide (LINZESS) 290 MCG CAPS capsule, Take 1 capsule (290 mcg total) by mouth daily., Disp: 90 capsule, Rfl: 4   norethindrone (MICRONOR) 0.35 MG tablet, Take 1 tablet by mouth daily., Disp: , Rfl:    omeprazole (PRILOSEC) 20 MG capsule, TAKE 1 CAPSULE BY MOUTH EVERY DAY, Disp: 90 capsule, Rfl: 0   ondansetron (ZOFRAN) 4 MG tablet, Take 1 tablet (4 mg total) by mouth every 8 (eight) hours as needed for nausea or vomiting., Disp: 20 tablet, Rfl: 0   SUMAtriptan (IMITREX) 100 MG tablet, Take 50-100 mg by mouth daily as needed., Disp: , Rfl:  No current facility-administered medications for  this visit.  Facility-Administered Medications Ordered in Other Visits:    acetaminophen (TYLENOL) tablet 1,000 mg, 1,000 mg, Oral, Once, Finnegan, Tollie Pizza, MD   Medications ordered in this encounter:  No orders of the defined types were placed in this encounter.    *If you need refills on other medications prior to your next appointment, please contact your pharmacy*  Follow-Up: Call back or seek an in-person evaluation if the symptoms worsen or if the condition fails to improve as anticipated.  [facility] [phone]  Other Instructions Keep skin clean and dry. Apply cold compresses. Take the antibiotics as directed. If not resolving or any new/worsening symptoms despite treatment, please seek an in-person evaluation.    If you have been instructed to have an in-person evaluation today at a local Urgent Care facility, please use the link below. It will take you to a list of all of our available [facility], including address, phone number and hours of operation. Please do not delay care.  [facility]  If you or a family member do not have a primary care provider, use the link below to schedule a visit and establish care. When you choose a [facility] primary care physician or advanced practice provider, you gain a long-term partner in health. Find a Primary Care Provider  Learn more about [facility]'s in-office and virtual care options: [facility] Get Care Now

## 2023-06-05 ENCOUNTER — Telehealth (INDEPENDENT_AMBULATORY_CARE_PROVIDER_SITE_OTHER): Payer: Self-pay | Admitting: Nurse Practitioner

## 2023-06-05 ENCOUNTER — Ambulatory Visit: Payer: MEDICAID | Admitting: Nurse Practitioner

## 2023-06-05 NOTE — Telephone Encounter (Signed)
New message    Notes  User  Comments  Sherrilyn Rist 05/04/2023  3:44 PM mail no show letter   Scheduling     Department/Location Provider Visit Type Status   Mon 04/16/23  9:30 AM [facility] Abi W/Wo Tbi No Show   Mon 04/16/23 10:00 AM [facility] No Show   Wed 03/07/23  9:30 AM [facility] 1 Le Venous Reflux Canceled   Wed 03/07/23  9:30 AM [facility] W/Wo Tbi Canceled   Wed 03/07/23 11:15 AM [facility], NP [facility] Canceled

## 2023-06-15 ENCOUNTER — Ambulatory Visit: Payer: MEDICAID | Admitting: Nurse Practitioner

## 2023-06-19 ENCOUNTER — Encounter: Payer: Self-pay | Admitting: Nurse Practitioner

## 2023-07-22 ENCOUNTER — Encounter: Payer: Self-pay | Admitting: Nurse Practitioner

## 2023-07-24 ENCOUNTER — Ambulatory Visit: Payer: MEDICAID | Admitting: Internal Medicine

## 2023-07-24 NOTE — Progress Notes (Deleted)
 Virtual Visit via Video Note  I connected with Jennifer Watts on 07/24/23 at  3:20 PM EDT by a video enabled telemedicine application and verified that I am speaking with the correct person using two identifiers.  Location: Patient: *** Provider: Office  Person's participating in this video call: Nicki Reaper, NP-C and Lenn Cal   I discussed the limitations of evaluation and management by telemedicine and the availability of in person appointments. The patient expressed understanding and agreed to proceed.  History of Present Illness:    Past Medical History:  Diagnosis Date   Anxiety    Depression    Migraines    MS (multiple sclerosis) (HCC)    Neuromuscular disorder (HCC)     Current Outpatient Medications  Medication Sig Dispense Refill   ALPRAZolam (XANAX) 0.5 MG tablet Take 0.5 mg by mouth at bedtime as needed for anxiety.     cholecalciferol (VITAMIN D3) 25 MCG (1000 UT) tablet Take 1,000 Units by mouth daily.     clobetasol cream (TEMOVATE) 0.05 % APPLY TO AFFECTED AREA TWICE A DAY 30 g 4   Cyanocobalamin (B-12 PO) Take 1 tablet by mouth daily.     DULoxetine (CYMBALTA) 60 MG capsule Take 1 capsule (60 mg total) by mouth daily. 90 capsule 4   linaclotide (LINZESS) 290 MCG CAPS capsule Take 1 capsule (290 mcg total) by mouth daily. 90 capsule 4   nortriptyline (PAMELOR) 10 MG capsule Take 20 mg by mouth at bedtime.     omeprazole (PRILOSEC) 20 MG capsule TAKE 1 CAPSULE BY MOUTH EVERY DAY 90 capsule 0   ondansetron (ZOFRAN) 4 MG tablet Take 1 tablet (4 mg total) by mouth every 8 (eight) hours as needed for nausea or vomiting. 20 tablet 0   SUMAtriptan (IMITREX) 100 MG tablet Take 50-100 mg by mouth daily as needed.     No current facility-administered medications for this visit.   Facility-Administered Medications Ordered in Other Visits  Medication Dose Route Frequency Provider Last Rate Last Admin   acetaminophen (TYLENOL) tablet 1,000 mg  1,000 mg Oral Once  Jeralyn Ruths, MD        Allergies  Allergen Reactions   Reglan [Metoclopramide] Other (See Comments)    Hyperactivity /becomes mean   Ibuprofen Rash   Penicillins Rash    Has patient had a PCN reaction causing immediate rash, facial/tongue/throat swelling, SOB or lightheadedness with hypotension: No Has patient had a PCN reaction causing severe rash involving mucus membranes or skin necrosis: No Has patient had a PCN reaction that required hospitalization: No Has patient had a PCN reaction occurring within the last 10 years: No If all of the above answers are "NO", then may proceed with Cephalosporin use.    Sulfa Antibiotics Rash   Sulfasalazine Rash    Family History  Problem Relation Age of Onset   Hypertension Maternal Grandmother    Cancer Paternal Grandfather        throat   Multiple sclerosis Paternal Grandfather    Anxiety disorder Mother     Social History   Socioeconomic History   Marital status: Single    Spouse name: Not on file   Number of children: Not on file   Years of education: Not on file   Highest education level: Not on file  Occupational History   Not on file  Tobacco Use   Smoking status: Former    Current packs/day: 0.00    Types: Cigarettes    Quit date: 01/09/2020  Years since quitting: 3.5   Smokeless tobacco: Never  Vaping Use   Vaping status: Former  Substance and Sexual Activity   Alcohol use: Yes    Comment: social   Drug use: No   Sexual activity: Yes    Birth control/protection: Injection  Other Topics Concern   Not on file  Social History Narrative   Not on file   Social Drivers of Health   Financial Resource Strain: Not on file  Food Insecurity: Not on file  Transportation Needs: Not on file  Physical Activity: Not on file  Stress: Not on file  Social Connections: Not on file  Intimate Partner Violence: Not on file     Constitutional: Denies fever, malaise, fatigue, headache or abrupt weight changes.   HEENT: Denies eye pain, eye redness, ear pain, ringing in the ears, wax buildup, runny nose, nasal congestion, bloody nose, or sore throat. Respiratory: Denies difficulty breathing, shortness of breath, cough or sputum production.   Cardiovascular: Denies chest pain, chest tightness, palpitations or swelling in the hands or feet.  Gastrointestinal: Denies abdominal pain, bloating, constipation, diarrhea or blood in the stool.  GU: Denies urgency, frequency, pain with urination, burning sensation, blood in urine, odor or discharge. Musculoskeletal: Denies decrease in range of motion, difficulty with gait, muscle pain or joint pain and swelling.  Skin: Denies redness, rashes, lesions or ulcercations.  Neurological: Denies dizziness, difficulty with memory, difficulty with speech or problems with balance and coordination.  Psych: Denies anxiety, depression, SI/HI.  No other specific complaints in a complete review of systems (except as listed in HPI above).  Observations/Objective:  There were no vitals taken for this visit. Wt Readings from Last 3 Encounters:  03/01/23 120 lb (54.4 kg)  02/19/23 125 lb (56.7 kg)  12/11/22 127 lb 9.6 oz (57.9 kg)    General: Appears their stated age, well developed, well nourished in NAD. Skin: Warm, dry and intact. No rashes, lesions or ulcerations noted. HEENT: Head: normal shape and size; Eyes: sclera white, no icterus, conjunctiva pink, PERRLA and EOMs intact; Ears: Tm's gray and intact, normal light reflex; Nose: mucosa pink and moist, septum midline; Throat/Mouth: Teeth present, mucosa pink and moist, no exudate, lesions or ulcerations noted.  Neck:  Neck supple, trachea midline. No masses, lumps or thyromegaly present.  Cardiovascular: Normal rate and rhythm. S1,S2 noted.  No murmur, rubs or gallops noted. No JVD or BLE edema. No carotid bruits noted. Pulmonary/Chest: Normal effort and positive vesicular breath sounds. No respiratory distress. No  wheezes, rales or ronchi noted.  Abdomen: Soft and nontender. Normal bowel sounds. No distention or masses noted. Liver, spleen and kidneys non palpable. Musculoskeletal: Normal range of motion. No signs of joint swelling. No difficulty with gait.  Neurological: Alert and oriented. Cranial nerves II-XII grossly intact. Coordination normal.  Psychiatric: Mood and affect normal. Behavior is normal. Judgment and thought content normal.     BMET    Component Value Date/Time   NA 138 10/16/2022 1546   NA 140 05/09/2014 1742   K 4.2 10/16/2022 1546   K 3.8 05/09/2014 1742   CL 104 10/16/2022 1546   CL 109 (H) 05/09/2014 1742   CO2 22 10/16/2022 1546   CO2 22 05/09/2014 1742   GLUCOSE 85 10/16/2022 1546   GLUCOSE 97 04/26/2020 2108   GLUCOSE 89 05/09/2014 1742   BUN 10 10/16/2022 1546   BUN 17 05/09/2014 1742   CREATININE 0.74 10/16/2022 1546   CREATININE 0.72 05/09/2014 1742  CALCIUM 9.7 10/16/2022 1546   CALCIUM 9.1 05/09/2014 1742   GFRNONAA >60 04/26/2020 2108   GFRAA 136 01/03/2019 1546    Lipid Panel     Component Value Date/Time   CHOL 176 05/02/2022 1216   TRIG 82 05/02/2022 1216   HDL 44 05/02/2022 1216   LDLCALC 117 (H) 05/02/2022 1216    CBC    Component Value Date/Time   WBC 9.7 10/16/2022 1546   WBC 10.6 (H) 04/26/2020 2108   RBC 4.83 10/16/2022 1546   RBC 4.56 04/26/2020 2108   HGB 14.6 10/16/2022 1546   HCT 43.5 10/16/2022 1546   PLT 396 10/16/2022 1546   MCV 90 10/16/2022 1546   MCV 87 05/09/2014 1742   MCH 30.2 10/16/2022 1546   MCH 31.6 04/26/2020 2108   MCHC 33.6 10/16/2022 1546   MCHC 34.3 04/26/2020 2108   RDW 12.1 10/16/2022 1546   RDW 14.5 05/09/2014 1742   LYMPHSABS 2.8 10/16/2022 1546   LYMPHSABS 2.8 05/09/2014 1742   MONOABS 0.6 05/06/2018 1826   MONOABS 0.6 05/09/2014 1742   EOSABS 0.2 10/16/2022 1546   EOSABS 0.3 05/09/2014 1742   BASOSABS 0.1 10/16/2022 1546   BASOSABS 0.1 05/09/2014 1742    Hgb A1C No results found for:  "HGBA1C"     Assessment and Plan:   Follow-up with your PCP as previously scheduled  Follow Up Instructions:    I discussed the assessment and treatment plan with the patient. The patient was provided an opportunity to ask questions and all were answered. The patient agreed with the plan and demonstrated an understanding of the instructions.   The patient was advised to call back or seek an in-person evaluation if the symptoms worsen or if the condition fails to improve as anticipated.   Nicki Reaper, NP

## 2023-07-27 ENCOUNTER — Ambulatory Visit: Payer: MEDICAID | Admitting: Nurse Practitioner

## 2023-08-15 ENCOUNTER — Encounter: Payer: Self-pay | Admitting: Neurology

## 2023-08-15 DIAGNOSIS — G35 Multiple sclerosis: Secondary | ICD-10-CM

## 2023-08-16 ENCOUNTER — Other Ambulatory Visit: Payer: Self-pay | Admitting: Neurology

## 2023-08-16 DIAGNOSIS — G35 Multiple sclerosis: Secondary | ICD-10-CM

## 2023-08-22 ENCOUNTER — Encounter: Payer: Self-pay | Admitting: Neurology

## 2023-08-23 ENCOUNTER — Other Ambulatory Visit: Payer: Self-pay | Admitting: Nurse Practitioner

## 2023-08-24 ENCOUNTER — Encounter: Payer: Self-pay | Admitting: Neurology

## 2023-08-24 NOTE — Telephone Encounter (Signed)
 Requested Prescriptions  Pending Prescriptions Disp Refills   omeprazole (PRILOSEC) 20 MG capsule [Pharmacy Med Name: OMEPRAZOLE DR 20 MG CAPSULE] 90 capsule 0    Sig: TAKE 1 CAPSULE BY MOUTH EVERY DAY     Gastroenterology: Proton Pump Inhibitors Failed - 08/24/2023 10:13 AM      Failed - Valid encounter within last 12 months    Recent Outpatient Visits   None

## 2023-09-22 ENCOUNTER — Ambulatory Visit
Admission: RE | Admit: 2023-09-22 | Discharge: 2023-09-22 | Disposition: A | Discharge status: Home / Self Care | Payer: MEDICAID | Source: Ambulatory Visit | Attending: Neurology | Admitting: Neurology

## 2023-09-22 DIAGNOSIS — G35 Multiple sclerosis: Secondary | ICD-10-CM

## 2023-09-24 ENCOUNTER — Encounter: Payer: Self-pay | Admitting: Nurse Practitioner

## 2023-10-02 ENCOUNTER — Encounter (INDEPENDENT_AMBULATORY_CARE_PROVIDER_SITE_OTHER): Payer: Self-pay

## 2023-11-22 ENCOUNTER — Encounter: Payer: Self-pay | Admitting: Nurse Practitioner

## 2023-11-23 MED ORDER — LINACLOTIDE 290 MCG PO CAPS: 290.0000 ug | ORAL_CAPSULE | Freq: Every day | ORAL | 4 refills | Status: AC

## 2023-11-23 MED ORDER — OMEPRAZOLE 20 MG PO CPDR: 20.0000 mg | DELAYED_RELEASE_CAPSULE | Freq: Every day | ORAL | 3 refills | Status: AC

## 2024-02-20 ENCOUNTER — Encounter: Payer: Self-pay | Admitting: Nurse Practitioner

## 2024-02-20 NOTE — Telephone Encounter (Signed)
 Scheduled

## 2024-02-22 ENCOUNTER — Other Ambulatory Visit: Payer: Self-pay

## 2024-02-22 ENCOUNTER — Ambulatory Visit
Admission: RE | Admit: 2024-02-22 | Discharge: 2024-02-22 | Disposition: A | Discharge status: Home / Self Care | Payer: MEDICAID | Source: Ambulatory Visit | Attending: Physician Assistant | Admitting: Physician Assistant

## 2024-02-22 VITALS — BP 137/97 | HR 110 | Temp 98.5°F | Resp 18

## 2024-02-22 DIAGNOSIS — L42 Pityriasis rosea: Secondary | ICD-10-CM

## 2024-02-22 MED ORDER — TRIAMCINOLONE ACETONIDE 0.5 % EX OINT: 1.0000 | TOPICAL_OINTMENT | Freq: Two times a day (BID) | CUTANEOUS | 0 refills | Status: AC

## 2024-02-22 NOTE — ED Provider Notes (Signed)
 [facility]    CSN: [id number] Arrival date & time: 02/22/24  1450      History   Chief Complaint Chief Complaint  Patient presents with   Rash    Hives/ rash, unsure if it's allergic reaction - Entered by patient    HPI Jennifer Watts is a 28 y.o. female presenting for pruritic rash of trunk for the past week. She reports noticing a bump that looked like a bug bite on her abdomen followed by emergence of rash throughout trunk later. The rash is mostly affecting the back and abdomen. There area couple spots on the thighs.   No fever, fatigue, pain, throat swelling or breathing issues.   No new medications, changes to detergents, body washes, perfumes or foods.   Medical history is significant for anxiety, depression, migraines, and MS.   Has tried oral benadryl  without relief.  HPI  Past Medical History:  Diagnosis Date   Anxiety    Depression    Migraines    MS (multiple sclerosis)    Neuromuscular disorder (HCC)     Patient Active Problem List   Diagnosis Date Noted   Chronic RLQ pain 03/01/2023   Psychophysiological insomnia 11/23/2021   Vitamin B12 deficiency 04/13/2021   Eczema 07/11/2019   Heart burn 01/27/2019   Migraine without aura and without status migrainosus, not intractable 03/14/2017   Nicotine  dependence, cigarettes, uncomplicated 07/08/2015   Multiple sclerosis 10/26/2014   Depression 02/27/2014   Anxiety 01/20/2014   Vitamin D  deficiency 12/29/2013    Past Surgical History:  Procedure Laterality Date   COLONOSCOPY WITH PROPOFOL  N/A 03/01/2023   Procedure: COLONOSCOPY WITH PROPOFOL ;  Surgeon: Therisa Bi, MD;  Location: [facility] ENDOSCOPY;  Service: Gastroenterology;  Laterality: N/A;   ESOPHAGOGASTRODUODENOSCOPY (EGD) WITH PROPOFOL  N/A 03/01/2023   Procedure: ESOPHAGOGASTRODUODENOSCOPY (EGD) WITH PROPOFOL ;  Surgeon: Therisa Bi, MD;  Location: [facility] ENDOSCOPY;  Service: Gastroenterology;  Laterality: N/A;   TYMPANOSTOMY TUBE  PLACEMENT      OB History   No obstetric history on file.      Home Medications    Prior to Admission medications   Medication Sig Start Date End Date Taking? Authorizing Provider  ALPRAZolam  (XANAX ) 0.5 MG tablet Take 0.5 mg by mouth at bedtime as needed for anxiety.   Yes [provider]  cholecalciferol  (VITAMIN D3) 25 MCG (1000 UT) tablet Take 1,000 Units by mouth daily.   Yes [provider]  Cyanocobalamin  (B-12 PO) Take 1 tablet by mouth daily.   Yes [provider]  DULoxetine  (CYMBALTA ) 60 MG capsule Take 1 capsule (60 mg total) by mouth daily. 02/16/23  Yes Cannady, Jolene T, NP  linaclotide  (LINZESS ) 290 MCG CAPS capsule Take 1 capsule (290 mcg total) by mouth daily. 11/23/23  Yes Cannady, Jolene T, NP  omeprazole  (PRILOSEC) 20 MG capsule Take 1 capsule (20 mg total) by mouth daily. 11/23/23  Yes Cannady, Jolene T, NP  SUMAtriptan  (IMITREX ) 100 MG tablet Take 50-100 mg by mouth daily as needed. 01/08/17  Yes [provider]  triamcinolone ointment (KENALOG) 0.5 % Apply 1 Application topically 2 (two) times daily. 02/22/24  Yes Arvis Jolan NOVAK, PA-C  nortriptyline (PAMELOR) 10 MG capsule Take 20 mg by mouth at bedtime. 05/02/23   [provider]    Family History Family History  Problem Relation Age of Onset   Hypertension Maternal Grandmother    Cancer Paternal Grandfather        throat   Multiple sclerosis Paternal Grandfather  Anxiety disorder Mother     Social History Social History   Tobacco Use   Smoking status: Former    Current packs/day: 0.00    Types: Cigarettes    Quit date: 01/09/2020    Years since quitting: 4.1   Smokeless tobacco: Never  Vaping Use   Vaping status: Former  Substance Use Topics   Alcohol use: Yes    Comment: social   Drug use: No     Allergies   Reglan  [metoclopramide ], Ibuprofen, Penicillins, Sulfa antibiotics, and Sulfasalazine   Review of Systems Review of Systems   Constitutional:  Negative for fatigue and fever.  HENT:  Negative for facial swelling and trouble swallowing.   Respiratory:  Negative for chest tightness and shortness of breath.   Gastrointestinal:  Negative for abdominal pain and vomiting.  Musculoskeletal:  Negative for arthralgias and myalgias.  Skin:  Positive for rash. Negative for wound.  Allergic/Immunologic: Negative for environmental allergies and food allergies.  Neurological:  Negative for weakness.     Physical Exam Triage Vital Signs ED Triage Vitals  Encounter Vitals Group     BP      Girls Systolic BP Percentile      Girls Diastolic BP Percentile      Boys Systolic BP Percentile      Boys Diastolic BP Percentile      Pulse      Resp      Temp      Temp src      SpO2      Weight      Height      Head Circumference      Peak Flow      Pain Score      Pain Loc      Pain Education      Exclude from Growth Chart    No data found.  Updated Vital Signs BP (!) 137/97   Pulse (!) 110   Temp 98.5 F (36.9 C)   Resp 18   LMP  (LMP Unknown) Comment: PT does not have a period with BC pills  SpO2 97%    Physical Exam Vitals and nursing note reviewed.  Constitutional:      General: She is not in acute distress.    Appearance: Normal appearance. She is not ill-appearing or toxic-appearing.  HENT:     Head: Normocephalic and atraumatic.     Nose: Nose normal.     Mouth/Throat:     Mouth: Mucous membranes are moist.     Pharynx: Oropharynx is clear.  Eyes:     General: No scleral icterus.       Right eye: No discharge.        Left eye: No discharge.     Conjunctiva/sclera: Conjunctivae normal.  Cardiovascular:     Rate and Rhythm: Normal rate and regular rhythm.     Heart sounds: Normal heart sounds.  Pulmonary:     Effort: Pulmonary effort is normal. No respiratory distress.     Breath sounds: Normal breath sounds.  Musculoskeletal:     Cervical back: Neck supple.  Skin:    General: Skin is  dry.     Findings: Rash (dry scaly circular/ovular erythematous patches on abdomen, upper/lower back and right inner thigh) present.  Neurological:     General: No focal deficit present.     Mental Status: She is alert. Mental status is at baseline.     Motor: No weakness.     Gait:  Gait normal.  Psychiatric:        Mood and Affect: Mood normal.        Behavior: Behavior normal.   RIGHT THIGH   UPPER BACK   LOWER BACK   ABDOMEN    UC Treatments / Results  Labs (all labs ordered are listed, but only abnormal results are displayed) Labs Reviewed - No data to display  EKG   Radiology No results found.  Procedures Procedures (including critical care time)  Medications Ordered in UC Medications - No data to display  Initial Impression / Assessment and Plan / UC Course  I have reviewed the triage vital signs and the nursing notes.  Pertinent labs & imaging results that were available during my care of the patient were reviewed by me and considered in my medical decision making (see chart for details).   28 y/o female presents for rash of trunk for the past week. Reports an initial lesion on abdomen followed by generalized rash on trunk. No fever, pain, or URI symptoms. No facial swelling or SOB. No identifiable triggers.   See images. Rash is most consistent with pityriasis rosea. Handouts given. Sent triamcinolone and advised antihistamines as needed. Discussed this is self-limited and can last a few weeks. Reviewed return precautions.   Final Clinical Impressions(s) / UC Diagnoses   Final diagnoses:  Pityriasis rosea     Discharge Instructions      -Rash is most consistent with pityriasis, viral illness. It is self resolving within a few weeks -Can take antihistamines and topical steroids as needed -If acute worsening--spreading to face, pustular drainage, pain, fever, seek re-evaluation.     ED Prescriptions     Medication Sig Dispense Auth. Provider    triamcinolone ointment (KENALOG) 0.5 % Apply 1 Application topically 2 (two) times daily. 45 g Arvis Jolan KATHEE DEVONNA      PDMP not reviewed this encounter.   Arvis Jolan B, PA-C 02/22/24 1601

## 2024-02-22 NOTE — ED Triage Notes (Signed)
 PT reports she has a rash that started 1 week ago . Rash is on her back,ABD  . Pt has noticed the rash has spread. Pt has taken benadryl  with out relief.

## 2024-02-22 NOTE — Discharge Instructions (Addendum)
-  Rash is most consistent with pityriasis, viral illness. It is self resolving within a few weeks -Can take antihistamines and topical steroids as needed -If acute worsening--spreading to face, pustular drainage, pain, fever, seek re-evaluation.

## 2024-02-23 NOTE — Patient Instructions (Signed)

## 2024-02-25 ENCOUNTER — Ambulatory Visit (INDEPENDENT_AMBULATORY_CARE_PROVIDER_SITE_OTHER): Payer: MEDICAID | Admitting: Nurse Practitioner

## 2024-02-25 ENCOUNTER — Encounter: Payer: Self-pay | Admitting: Nurse Practitioner

## 2024-02-25 VITALS — BP 118/80 | HR 98 | Temp 98.4°F | Resp 17 | Ht 65.0 in | Wt 128.4 lb

## 2024-02-25 DIAGNOSIS — R21 Rash and other nonspecific skin eruption: Secondary | ICD-10-CM | POA: Diagnosis not present

## 2024-02-25 MED ORDER — ACYCLOVIR 400 MG PO TABS: 400.0000 mg | ORAL_TABLET | Freq: Every day | ORAL | 0 refills | Status: AC

## 2024-02-25 NOTE — Assessment & Plan Note (Signed)
 Acute with some spread. Start Acyclovir 400 MG 5 times a day for one week and continue Triamcinolone cream.  Recommend she apply Sarna lotion to areas to help with itching and continue Benadryl  as needed.  Educated her on rash and plan of care.  Return in one week.

## 2024-02-25 NOTE — Progress Notes (Signed)
 BP 118/80 (BP Location: Left Arm, Patient Position: Sitting, Cuff Size: Normal)   Pulse 98   Temp 98.4 F (36.9 C) (Oral)   Resp 17   Ht 5' 5 (1.651 m)   Wt 128 lb 6.4 oz (58.2 kg)   LMP  (LMP Unknown) Comment: PT does not have a period with BC pills  SpO2 98%   BMI 21.37 kg/m    Subjective:    Patient ID: Charlott LITTIE Boston, female    DOB: Oct 17, 1995, 28 y.o.   MRN: [id number]  HPI: MAKYNLIE ROSSINI is a 28 y.o. female  Chief Complaint  Patient presents with   Rash    UC says Pityriasis rosea. Very itchy, blisters- Stomach and back.    RASH Seen in UC on 02/22/24 and diagnosed with pityriasis rosea. Discharged with steroid cream. Has had rash for over a week and this has gotten a lot worse.  Feels like it is spreading out more. Only recent change is new tattoos to right thigh and upper left arm. Duration:  weeks  Location: started out stomach and back area  Itching: yes Burning: no Redness: yes Oozing: no Scaling: yes Blisters: yes Painful: no Fevers: no Change in detergents/soaps/personal care products: no Recent illness: no Recent travel:no History of same: no Context: stable Alleviating factors: nothing Treatments attempted: Triamcinolone and Benadryl  Shortness of breath: no  Throat/tongue swelling: no Myalgias/arthralgias: no   Relevant past medical, surgical, family and social history reviewed and updated as indicated. Interim medical history since our last visit reviewed. Allergies and medications reviewed and updated.  Review of Systems  Constitutional:  Negative for activity change, appetite change, diaphoresis, fatigue and fever.  Respiratory:  Negative for cough, chest tightness, shortness of breath and wheezing.   Cardiovascular:  Negative for chest pain, palpitations and leg swelling.  Gastrointestinal: Negative.   Skin:  Positive for rash.  Neurological: Negative.   Psychiatric/Behavioral: Negative.      Per HPI unless specifically indicated  above     Objective:    BP 118/80 (BP Location: Left Arm, Patient Position: Sitting, Cuff Size: Normal)   Pulse 98   Temp 98.4 F (36.9 C) (Oral)   Resp 17   Ht 5' 5 (1.651 m)   Wt 128 lb 6.4 oz (58.2 kg)   LMP  (LMP Unknown) Comment: PT does not have a period with BC pills  SpO2 98%   BMI 21.37 kg/m   Wt Readings from Last 3 Encounters:  02/25/24 128 lb 6.4 oz (58.2 kg)  03/01/23 120 lb (54.4 kg)  02/19/23 125 lb (56.7 kg)    Physical Exam Vitals and nursing note reviewed.  Constitutional:      General: She is awake. She is not in acute distress.    Appearance: She is well-developed and well-groomed. She is not ill-appearing or toxic-appearing.  HENT:     Head: Normocephalic.     Right Ear: Hearing and external ear normal.     Left Ear: Hearing and external ear normal.  Eyes:     General: Lids are normal.        Right eye: No discharge.        Left eye: No discharge.     Conjunctiva/sclera: Conjunctivae normal.     Pupils: Pupils are equal, round, and reactive to light.  Neck:     Thyroid: No thyromegaly.     Vascular: No carotid bruit.  Cardiovascular:     Rate and Rhythm: Normal rate and regular  rhythm.     Heart sounds: Normal heart sounds. No murmur heard.    No gallop.  Pulmonary:     Effort: Pulmonary effort is normal. No accessory muscle usage or respiratory distress.     Breath sounds: Normal breath sounds.  Abdominal:     General: Bowel sounds are normal. There is no distension.     Palpations: Abdomen is soft.     Tenderness: There is no abdominal tenderness.  Musculoskeletal:     Cervical back: Normal range of motion and neck supple.     Right lower leg: No edema.     Left lower leg: No edema.  Lymphadenopathy:     Cervical: No cervical adenopathy.  Skin:    General: Skin is warm and dry.     Findings: Rash present.     Comments: Scattered patches, some clustered and some individual, of raised red patches. Some with scaling and others with small  clear blisters. Present to lower and mid back, abdominal area, and bilateral thighs.  Neurological:     Mental Status: She is alert and oriented to person, place, and time.     Deep Tendon Reflexes: Reflexes are normal and symmetric.     Reflex Scores:      Brachioradialis reflexes are 2+ on the right side and 2+ on the left side.      Patellar reflexes are 2+ on the right side and 2+ on the left side. Psychiatric:        Attention and Perception: Attention normal.        Mood and Affect: Mood normal.        Speech: Speech normal.        Behavior: Behavior normal. Behavior is cooperative.        Thought Content: Thought content normal.     Results for orders placed or performed during the hospital encounter of 03/01/23  Surgical pathology   Collection Time: 03/01/23 12:00 AM  Result Value Ref Range   SURGICAL PATHOLOGY      SURGICAL PATHOLOGY [facility] [address] [place], [place] [address] Telephone [phone] or [phone] Fax [phone]  REPORT OF SURGICAL PATHOLOGY   Accession #: [id number] Patient Name: STAVROULA, ROHDE Visit # : [id number]  MRN: [id number] Physician: Therisa Bi DOB/Age [birth year] (Age: 49) Gender: F Collected Date: 03/01/2023 Received Date: 03/01/2023  FINAL DIAGNOSIS       1. Stomach, biopsy, CBX :       - UNREMARKABLE GASTRIC MUCOSA.      - NEGATIVE FOR H. PYLORI, INTESTINAL METAPLASIA, DYSPLASIA, AND MALIGNANCY.       2. Terminal ileum, biopsy, CBX :       - UNREMARKABLE SMALL INTESTINAL MUCOSA.      - NEGATIVE FOR GRANULOMAS, DYSPLASIA, AND MALIGNANCY.       ELECTRONIC SIGNATURE : Rubinas Md, Rexene , Sports administrator, Electronic Signature  MICROSCOPIC DESCRIPTION  CASE COMMENTS STAINS USED IN DIAGNOSIS: H&E H&E    CLINICAL HISTORY  SPECIMEN(S) OBTAINED 1. Stomach, biopsy, CBX 2. Terminal ileum,  biopsy, CBX  SPECIMEN COMMENTS: 1. For gastritis 2. R/O Crohn's SPECIMEN CLINICAL  INFORMATION: 1. Normal endoscopy    Gross Description 1. Gastric CBX for gastritis, received in formalin is a 0.6 x 0.5 x 0.1 cm aggregate of 3 white-tan tissue fragments.The specimen is submitted in toto in 1 block (1A). 2. Terminal ileum CBX R/O Crohn's, received in formalin is a 0.9 x 0.3 x 0.1 cm aggregate of  3 tan-gray tissue fragment.The specimen is submitted in toto in 1 block (2A).      [facility] 03/01/2023        Report signed out from the following location(s) [place]. [facility] [address] [place], [place] [address] CLIA #: [id number]  [facility] [address] [place], [place] [address] CLIA #: [id number]       Assessment & Plan:   Problem List Items Addressed This Visit       Musculoskeletal and Integument   Pityriasis rosea-like skin eruption - Primary   Acute with some spread. Start Acyclovir 400 MG 5 times a day for one week and continue Triamcinolone cream.  Recommend she apply Sarna lotion to areas to help with itching and continue Benadryl  as needed.  Educated her on rash and plan of care.  Return in one week.        Follow up plan: Return in about 1 week (around 03/03/2024) for RASH.

## 2024-03-02 NOTE — Patient Instructions (Signed)
 Be Involved in Caring For Your Health:  Taking Medications When medications are taken as directed, they can greatly improve your health. But if they are not taken as prescribed, they may not work. In some cases, not taking them correctly can be harmful. To help ensure your treatment remains effective and safe, understand your medications and how to take them. Bring your medications to each visit for review by your provider.  Your lab results, notes, and after visit summary will be available on My Chart. We strongly encourage you to use this feature. If lab results are abnormal the clinic will contact you with the appropriate steps. If the clinic does not contact you assume the results are satisfactory. You can always view your results on My Chart. If you have questions regarding your health or results, please contact the clinic during office hours. You can also ask questions on My Chart.  We at [facility] are grateful that you chose [place] to provide your care. We strive to provide evidence-based and compassionate care and are always looking for feedback. If you get a survey from the clinic please complete this so we can hear your opinions.  Rash, Adult  A rash is a breakout of spots or blotches on the skin. It can change the way your skin looks and feels. Many things can cause a rash. The goal of treatment is to stop the itching and keep the rash from spreading. Follow these instructions at home: Medicine Take or apply over-the-counter and prescription medicines only as told by your doctor. These may include medicines to treat: Red or swollen skin. Itching. An allergy. Pain. An infection.  Skin care Put a cool, wet cloth on the rash. Do not scratch or rub your skin. Try not to cover the rash. Keep it exposed to air as often as you can. Managing itching and discomfort Avoid hot showers or baths. These can make itching worse. A cold shower may help. Try taking a bath with: Epsom  salts. You can get these at your pharmacy or grocery store. Follow the instructions on the package. Baking soda. Pour a small amount into the bath as told by your doctor. Colloidal oatmeal. You can get this at your pharmacy or grocery store. Follow the instructions on the package. Try putting baking soda paste on your skin. Stir water into baking soda until it gets like a paste. Try putting on a lotion to help with itching (calamine lotion). Keep cool. Stay out of the sun. Sweating and being hot can make itching worse. General instructions  Rest as needed. Drink enough fluid to keep your pee (urine) pale yellow. Wear loose-fitting clothes. Avoid scented soaps, detergents, and perfumes. Use gentle soaps, detergents, perfumes, and cosmetics. Avoid the things that cause your rash. Keep a journal to help keep track of what causes your rash. Write down: What you eat. What cosmetics you use. What you drink. What you wear. This includes jewelry. Contact a doctor if: You sweat a lot at night. You pee (urinate) more or less than normal. Your pee is a darker color than normal. Your eyes are sensitive to light. Your skin or the white parts of your eyes turn yellow. Your skin tingles or is numb. You get painful blisters in your nose or mouth. Your rash does not go away after a few days, or it gets worse. You are more tired than normal. You are more thirsty than normal. You have new or worse symptoms. These may include: Pain  in your belly. A fever. Watery poop (diarrhea). Vomiting. Weakness. Weight loss. Get help right away if: You start to feel mixed up (confused). You have a very bad headache or a stiff neck. You have very bad joint pain or stiffness. You get very sleepy or not responsive. You have a seizure. This information is not intended to replace advice given to you by your health care provider. Make sure you discuss any questions you have with your health care provider. Document  Revised: 02/17/2022 Document Reviewed: 02/17/2022 Elsevier Patient Education  2024 [employer].

## 2024-03-03 ENCOUNTER — Encounter: Payer: Self-pay | Admitting: Nurse Practitioner

## 2024-03-03 ENCOUNTER — Telehealth: Payer: MEDICAID | Admitting: Nurse Practitioner

## 2024-03-03 DIAGNOSIS — R21 Rash and other nonspecific skin eruption: Secondary | ICD-10-CM | POA: Diagnosis not present

## 2024-03-03 NOTE — Progress Notes (Signed)
 Ht 5' 5 (1.651 m)   Wt 127 lb (57.6 kg)   LMP  (LMP Unknown) Comment: PT does not have a period with BC pills  BMI 21.13 kg/m    Subjective:    Patient ID: Charlott LITTIE Boston, female    DOB: 09-08-1995, 28 y.o.   MRN: [id number]  HPI: ILZE ROSELLI is a 28 y.o. female  Chief Complaint  Patient presents with   Rash    Feels it is about the same appearance wise but not as irritated. Has minimal spots now on her legs.     Virtual Visit via Video Note  I connected with Chelsa L Ohms on 03/03/24 at  2:20 PM EDT by a video enabled telemedicine application and verified that I am speaking with the correct person using two identifiers.  Location: Patient: home Provider: work   I discussed the limitations of evaluation and management by telemedicine and the availability of in person appointments. The patient expressed understanding and agreed to proceed.  I discussed the assessment and treatment plan with the patient. The patient was provided an opportunity to ask questions and all were answered. The patient agreed with the plan and demonstrated an understanding of the instructions.   The patient was advised to call back or seek an in-person evaluation if the symptoms worsen or if the condition fails to improve as anticipated.  I provided 25 minutes of non-face-to-face time during this encounter.   Nochum Fenter T Elnor Renovato, NP   RASH Follow-up for rash. Seen in UC on 02/22/24 and diagnosed with pityriasis rosea. Was given steroid cream. At recent visit with PCP started Acyclovir on 02/25/24. She reports rash looks about the same, but does not seem as inflamed and irritated.  Has three Acyclovir left. Not like it was at all Duration:  weeks  Location: started out stomach and back area  Itching: no Burning: no Redness: no Oozing: no Scaling: no Blisters: no Painful: no Fevers: no Change in detergents/soaps/personal care products: no Recent illness: no Recent travel:no History of  same: no Context: stable Alleviating factors: nothing Treatments attempted: Triamcinolone and Benadryl  Shortness of breath: no  Throat/tongue swelling: no Myalgias/arthralgias: no   Relevant past medical, surgical, family and social history reviewed and updated as indicated. Interim medical history since our last visit reviewed. Allergies and medications reviewed and updated.  Review of Systems  Constitutional:  Negative for activity change, appetite change, diaphoresis, fatigue and fever.  Respiratory:  Negative for cough, chest tightness, shortness of breath and wheezing.   Cardiovascular:  Negative for chest pain, palpitations and leg swelling.  Gastrointestinal: Negative.   Skin:  Positive for rash.  Neurological: Negative.   Psychiatric/Behavioral: Negative.     Per HPI unless specifically indicated above     Objective:    Ht 5' 5 (1.651 m)   Wt 127 lb (57.6 kg)   LMP  (LMP Unknown) Comment: PT does not have a period with BC pills  BMI 21.13 kg/m   Wt Readings from Last 3 Encounters:  03/03/24 127 lb (57.6 kg)  02/25/24 128 lb 6.4 oz (58.2 kg)  03/01/23 120 lb (54.4 kg)    Physical Exam Vitals and nursing note reviewed.  Constitutional:      General: She is awake. She is not in acute distress.    Appearance: She is well-developed. She is not ill-appearing.  HENT:     Head: Normocephalic.     Right Ear: Hearing normal.     Left Ear:  Hearing normal.  Eyes:     General: Lids are normal.        Right eye: No discharge.        Left eye: No discharge.     Conjunctiva/sclera: Conjunctivae normal.  Pulmonary:     Effort: Pulmonary effort is normal. No accessory muscle usage or respiratory distress.  Musculoskeletal:     Cervical back: Normal range of motion.  Neurological:     Mental Status: She is alert and oriented to person, place, and time.  Psychiatric:        Attention and Perception: Attention normal.        Mood and Affect: Mood normal.        Behavior:  Behavior normal. Behavior is cooperative.        Thought Content: Thought content normal.        Judgment: Judgment normal.    Results for orders placed or performed during the hospital encounter of 03/01/23  Surgical pathology   Collection Time: 03/01/23 12:00 AM  Result Value Ref Range   SURGICAL PATHOLOGY      SURGICAL PATHOLOGY [facility] [address], [address] [place], [place] [address] Telephone [phone] or [phone] Fax [phone]  REPORT OF SURGICAL PATHOLOGY   Accession #: [id number] Patient Name: KANOELANI, DOBIES Visit # : [id number]  MRN: [id number] Physician: Therisa Bi DOB/Age [birth year] (Age: 5) Gender: F Collected Date: 03/01/2023 Received Date: 03/01/2023  FINAL DIAGNOSIS       1. Stomach, biopsy, CBX :       - UNREMARKABLE GASTRIC MUCOSA.      - NEGATIVE FOR H. PYLORI, INTESTINAL METAPLASIA, DYSPLASIA, AND MALIGNANCY.       2. Terminal ileum, biopsy, CBX :       - UNREMARKABLE SMALL INTESTINAL MUCOSA.      - NEGATIVE FOR GRANULOMAS, DYSPLASIA, AND MALIGNANCY.       ELECTRONIC SIGNATURE : Rubinas Md, Rexene , Sports administrator, Electronic Signature  MICROSCOPIC DESCRIPTION  CASE COMMENTS STAINS USED IN DIAGNOSIS: H&E H&E    CLINICAL HISTORY  SPECIMEN(S) OBTAINED 1. Stomach, biopsy, CBX 2. Terminal ileum,  biopsy, CBX  SPECIMEN COMMENTS: 1. For gastritis 2. R/O Crohn's SPECIMEN CLINICAL INFORMATION: 1. Normal endoscopy    Gross Description 1. Gastric CBX for gastritis, received in formalin is a 0.6 x 0.5 x 0.1 cm aggregate of 3 white-tan tissue fragments.The specimen is submitted in toto in 1 block (1A). 2. Terminal ileum CBX R/O Crohn's, received in formalin is a 0.9 x 0.3 x 0.1 cm aggregate of 3 tan-gray tissue fragment.The specimen is submitted in toto in 1 block (2A).      [facility] 03/01/2023        Report signed out from the following location(s) [place] [address] [place], [place] [address] CLIA #: [id number]  [facility] [address] [place], [place] [address] CLIA #: [id number]       Assessment & Plan:   Problem List Items Addressed This Visit       Musculoskeletal and Integument   Pityriasis rosea-like skin eruption   Acute with improvement. Complete full course of Acyclovir 400 MG 5 times a day and continue Triamcinolone cream.  Recommend she apply Sarna lotion to areas to help with itching and continue Benadryl  as needed.  Educated her on rash and plan of care.  Could consider 5 days of Prednisone  40 MG daily if ongoing issues.  Follow up plan: Return if symptoms worsen or fail to improve.

## 2024-03-03 NOTE — Assessment & Plan Note (Addendum)
 Acute with improvement. Complete full course of Acyclovir 400 MG 5 times a day and continue Triamcinolone cream.  Recommend she apply Sarna lotion to areas to help with itching and continue Benadryl  as needed.  Educated her on rash and plan of care.  Could consider 5 days of Prednisone  40 MG daily if ongoing issues.

## 2024-03-21 ENCOUNTER — Other Ambulatory Visit: Payer: Self-pay | Admitting: Neurology

## 2024-03-21 DIAGNOSIS — G35D Multiple sclerosis, unspecified: Secondary | ICD-10-CM

## 2024-03-27 ENCOUNTER — Ambulatory Visit: Admission: RE | Admit: 2024-03-27 | Payer: MEDICAID
